# Patient Record
Sex: Female | Born: 1970
Health system: Southern US, Community
[De-identification: ages and names within clinical notes are randomized; demographics above are authoritative.]

## PROBLEM LIST (undated history)

## (undated) DIAGNOSIS — F411 Generalized anxiety disorder: Secondary | ICD-10-CM

## (undated) DIAGNOSIS — T7840XA Allergy, unspecified, initial encounter: Secondary | ICD-10-CM

## (undated) DIAGNOSIS — Z87442 Personal history of urinary calculi: Secondary | ICD-10-CM

## (undated) DIAGNOSIS — E039 Hypothyroidism, unspecified: Secondary | ICD-10-CM

## (undated) DIAGNOSIS — N189 Chronic kidney disease, unspecified: Secondary | ICD-10-CM

## (undated) DIAGNOSIS — L639 Alopecia areata, unspecified: Secondary | ICD-10-CM

## (undated) DIAGNOSIS — R001 Bradycardia, unspecified: Secondary | ICD-10-CM

## (undated) DIAGNOSIS — J45909 Unspecified asthma, uncomplicated: Secondary | ICD-10-CM

## (undated) DIAGNOSIS — K589 Irritable bowel syndrome without diarrhea: Secondary | ICD-10-CM

## (undated) DIAGNOSIS — D649 Anemia, unspecified: Secondary | ICD-10-CM

## (undated) HISTORY — DX: Hypothyroidism, unspecified: E03.9

## (undated) HISTORY — DX: Anemia, unspecified: D64.9

## (undated) HISTORY — DX: Allergy, unspecified, initial encounter: T78.40XA

## (undated) HISTORY — PX: TUBAL LIGATION: SHX77

## (undated) HISTORY — DX: Chronic kidney disease, unspecified: N18.9

## (undated) HISTORY — DX: Bradycardia, unspecified: R00.1

## (undated) HISTORY — DX: Unspecified asthma, uncomplicated: J45.909

## (undated) HISTORY — DX: Generalized anxiety disorder: F41.1

## (undated) HISTORY — DX: Alopecia areata, unspecified: L63.9

---
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
## 1997-11-09 ENCOUNTER — Inpatient Hospital Stay (HOSPITAL_COMMUNITY): Admission: AD | Admit: 1997-11-09 | Discharge: 1997-11-09 | Payer: Self-pay | Admitting: Obstetrics & Gynecology

## 1997-11-14 ENCOUNTER — Inpatient Hospital Stay (HOSPITAL_COMMUNITY): Admission: AD | Admit: 1997-11-14 | Discharge: 1997-11-16 | Payer: Self-pay | Admitting: Obstetrics and Gynecology

## 1998-07-18 ENCOUNTER — Inpatient Hospital Stay (HOSPITAL_COMMUNITY): Admission: AD | Admit: 1998-07-18 | Discharge: 1998-07-18 | Payer: Self-pay | Admitting: Obstetrics and Gynecology

## 1998-10-04 HISTORY — PX: TUBAL LIGATION: SHX77

## 1999-01-09 ENCOUNTER — Ambulatory Visit (HOSPITAL_COMMUNITY): Admission: RE | Admit: 1999-01-09 | Discharge: 1999-01-09 | Payer: Self-pay | Admitting: Obstetrics & Gynecology

## 2000-07-26 ENCOUNTER — Other Ambulatory Visit: Admission: RE | Admit: 2000-07-26 | Discharge: 2000-07-26 | Payer: Self-pay | Admitting: Obstetrics & Gynecology

## 2001-07-17 ENCOUNTER — Ambulatory Visit (HOSPITAL_COMMUNITY): Admission: RE | Admit: 2001-07-17 | Discharge: 2001-07-17 | Payer: Self-pay | Admitting: Urology

## 2001-07-17 ENCOUNTER — Encounter: Payer: Self-pay | Admitting: Urology

## 2001-12-11 ENCOUNTER — Other Ambulatory Visit: Admission: RE | Admit: 2001-12-11 | Discharge: 2001-12-11 | Payer: Self-pay | Admitting: Obstetrics & Gynecology

## 2002-02-27 ENCOUNTER — Encounter: Payer: Self-pay | Admitting: Otolaryngology

## 2002-02-27 ENCOUNTER — Encounter: Admission: RE | Admit: 2002-02-27 | Discharge: 2002-02-27 | Payer: Self-pay | Admitting: Otolaryngology

## 2002-05-04 DIAGNOSIS — L639 Alopecia areata, unspecified: Secondary | ICD-10-CM

## 2002-05-04 HISTORY — PX: CHOLECYSTECTOMY: SHX55

## 2002-05-04 HISTORY — DX: Alopecia areata, unspecified: L63.9

## 2002-05-19 ENCOUNTER — Ambulatory Visit (HOSPITAL_COMMUNITY): Admission: RE | Admit: 2002-05-19 | Discharge: 2002-05-19 | Payer: Self-pay | Admitting: Family Medicine

## 2002-05-19 ENCOUNTER — Encounter: Payer: Self-pay | Admitting: Family Medicine

## 2002-05-19 ENCOUNTER — Inpatient Hospital Stay (HOSPITAL_COMMUNITY): Admission: EM | Admit: 2002-05-19 | Discharge: 2002-05-21 | Payer: Self-pay | Admitting: Emergency Medicine

## 2002-05-19 ENCOUNTER — Encounter (INDEPENDENT_AMBULATORY_CARE_PROVIDER_SITE_OTHER): Payer: Self-pay | Admitting: Specialist

## 2002-05-20 ENCOUNTER — Encounter: Payer: Self-pay | Admitting: Surgery

## 2002-11-06 ENCOUNTER — Encounter: Payer: Self-pay | Admitting: Endocrinology

## 2002-11-06 ENCOUNTER — Encounter: Admission: RE | Admit: 2002-11-06 | Discharge: 2002-11-06 | Payer: Self-pay | Admitting: Endocrinology

## 2003-10-08 ENCOUNTER — Other Ambulatory Visit: Admission: RE | Admit: 2003-10-08 | Discharge: 2003-10-08 | Payer: Self-pay | Admitting: Obstetrics & Gynecology

## 2004-07-09 ENCOUNTER — Inpatient Hospital Stay (HOSPITAL_COMMUNITY): Admission: AD | Admit: 2004-07-09 | Discharge: 2004-07-13 | Payer: Self-pay | Admitting: Family Medicine

## 2004-07-09 ENCOUNTER — Ambulatory Visit: Payer: Self-pay | Admitting: Family Medicine

## 2004-10-21 ENCOUNTER — Ambulatory Visit: Payer: Self-pay | Admitting: Endocrinology

## 2004-10-26 ENCOUNTER — Other Ambulatory Visit: Admission: RE | Admit: 2004-10-26 | Discharge: 2004-10-26 | Payer: Self-pay | Admitting: Obstetrics & Gynecology

## 2004-10-27 ENCOUNTER — Other Ambulatory Visit: Admission: RE | Admit: 2004-10-27 | Discharge: 2004-10-27 | Payer: Self-pay | Admitting: Obstetrics & Gynecology

## 2004-10-28 ENCOUNTER — Ambulatory Visit: Payer: Self-pay | Admitting: Endocrinology

## 2005-04-12 ENCOUNTER — Other Ambulatory Visit: Admission: RE | Admit: 2005-04-12 | Discharge: 2005-04-12 | Payer: Self-pay | Admitting: Obstetrics & Gynecology

## 2005-10-25 ENCOUNTER — Ambulatory Visit: Payer: Self-pay | Admitting: Endocrinology

## 2005-11-01 ENCOUNTER — Ambulatory Visit: Payer: Self-pay | Admitting: Endocrinology

## 2006-11-08 ENCOUNTER — Ambulatory Visit: Payer: Self-pay | Admitting: Endocrinology

## 2006-11-08 LAB — CONVERTED CEMR LAB
ALT: 19 units/L (ref 0–40)
AST: 22 units/L (ref 0–37)
Albumin: 3.7 g/dL (ref 3.5–5.2)
Alkaline Phosphatase: 35 units/L — ABNORMAL LOW (ref 39–117)
BUN: 7 mg/dL (ref 6–23)
Basophils Absolute: 0.1 10*3/uL (ref 0.0–0.1)
Basophils Relative: 1.1 % — ABNORMAL HIGH (ref 0.0–1.0)
Bilirubin Urine: NEGATIVE
Bilirubin, Direct: 0.2 mg/dL (ref 0.0–0.3)
CO2: 28 meq/L (ref 19–32)
Calcium: 9 mg/dL (ref 8.4–10.5)
Chloride: 110 meq/L (ref 96–112)
Cholesterol: 156 mg/dL (ref 0–200)
Creatinine, Ser: 0.7 mg/dL (ref 0.4–1.2)
Crystals: NEGATIVE
Eosinophils Absolute: 0.3 10*3/uL (ref 0.0–0.6)
Eosinophils Relative: 6 % — ABNORMAL HIGH (ref 0.0–5.0)
GFR calc Af Amer: 122 mL/min
GFR calc non Af Amer: 101 mL/min
Glucose, Bld: 90 mg/dL (ref 70–99)
HCT: 38.3 % (ref 36.0–46.0)
HDL: 52.7 mg/dL (ref >39.0)
Hemoglobin, Urine: NEGATIVE
Hemoglobin: 13.3 g/dL (ref 12.0–15.0)
Ketones, ur: NEGATIVE mg/dL
LDL Cholesterol: 90 mg/dL (ref 0–99)
Lymphocytes Relative: 24.3 % (ref 12.0–46.0)
MCHC: 34.7 g/dL (ref 30.0–36.0)
MCV: 89.2 fL (ref 78.0–100.0)
Monocytes Absolute: 0.7 10*3/uL (ref 0.2–0.7)
Monocytes Relative: 13.6 % — ABNORMAL HIGH (ref 3.0–11.0)
Mucus, UA: NEGATIVE
Neutro Abs: 2.7 10*3/uL (ref 1.4–7.7)
Neutrophils Relative %: 55 % (ref 43.0–77.0)
Nitrite: NEGATIVE
Platelets: 296 10*3/uL (ref 150–400)
Potassium: 4.4 meq/L (ref 3.5–5.1)
RBC / HPF: NONE SEEN
RBC: 4.3 M/uL (ref 3.87–5.11)
RDW: 11.7 % (ref 11.5–14.6)
Sodium: 141 meq/L (ref 135–145)
Specific Gravity, Urine: 1.02 (ref 1.000–1.03)
TSH: 1.49 microintl units/mL (ref 0.35–5.50)
Total Bilirubin: 0.6 mg/dL (ref 0.3–1.2)
Total CHOL/HDL Ratio: 3
Total Protein, Urine: NEGATIVE mg/dL
Total Protein: 6.6 g/dL (ref 6.0–8.3)
Triglycerides: 69 mg/dL (ref 0–149)
Urine Glucose: NEGATIVE mg/dL
Urobilinogen, UA: 0.2 (ref 0.0–1.0)
VLDL: 14 mg/dL (ref 0–40)
WBC: 5 10*3/uL (ref 4.5–10.5)
pH: 7 (ref 5.0–8.0)

## 2006-11-15 ENCOUNTER — Ambulatory Visit: Payer: Self-pay | Admitting: Endocrinology

## 2007-06-03 ENCOUNTER — Encounter: Payer: Self-pay | Admitting: Endocrinology

## 2007-06-03 DIAGNOSIS — J45909 Unspecified asthma, uncomplicated: Secondary | ICD-10-CM

## 2007-06-03 DIAGNOSIS — E039 Hypothyroidism, unspecified: Secondary | ICD-10-CM

## 2007-06-03 DIAGNOSIS — J309 Allergic rhinitis, unspecified: Secondary | ICD-10-CM | POA: Insufficient documentation

## 2007-06-03 DIAGNOSIS — F411 Generalized anxiety disorder: Secondary | ICD-10-CM | POA: Insufficient documentation

## 2007-06-03 HISTORY — DX: Generalized anxiety disorder: F41.1

## 2007-06-03 HISTORY — DX: Hypothyroidism, unspecified: E03.9

## 2007-06-03 HISTORY — DX: Unspecified asthma, uncomplicated: J45.909

## 2007-11-13 ENCOUNTER — Ambulatory Visit: Payer: Self-pay | Admitting: Endocrinology

## 2007-11-14 LAB — CONVERTED CEMR LAB: TSH: 2.69 microintl units/mL (ref 0.35–5.50)

## 2007-12-12 ENCOUNTER — Ambulatory Visit: Payer: Self-pay | Admitting: Endocrinology

## 2007-12-13 ENCOUNTER — Telehealth (INDEPENDENT_AMBULATORY_CARE_PROVIDER_SITE_OTHER): Payer: Self-pay | Admitting: *Deleted

## 2007-12-19 ENCOUNTER — Encounter: Admission: RE | Admit: 2007-12-19 | Discharge: 2007-12-19 | Payer: Self-pay | Admitting: Endocrinology

## 2008-10-15 ENCOUNTER — Telehealth (INDEPENDENT_AMBULATORY_CARE_PROVIDER_SITE_OTHER): Payer: Self-pay | Admitting: *Deleted

## 2008-11-15 ENCOUNTER — Ambulatory Visit: Payer: Self-pay | Admitting: Endocrinology

## 2009-02-13 ENCOUNTER — Ambulatory Visit: Payer: Self-pay | Admitting: Endocrinology

## 2009-02-24 ENCOUNTER — Telehealth (INDEPENDENT_AMBULATORY_CARE_PROVIDER_SITE_OTHER): Payer: Self-pay | Admitting: *Deleted

## 2009-12-09 ENCOUNTER — Ambulatory Visit: Payer: Self-pay | Admitting: Endocrinology

## 2010-09-17 ENCOUNTER — Encounter
Admission: RE | Admit: 2010-09-17 | Discharge: 2010-09-17 | Payer: Self-pay | Source: Home / Self Care | Attending: Family Medicine | Admitting: Family Medicine

## 2010-11-03 NOTE — Assessment & Plan Note (Signed)
Summary: f/u appt/#/cd   Vital Signs:  Patient profile:   39 year old female Height:      64 inches Weight:      153.50 pounds BMI:     26.44 O2 Sat:      98 % on Room air Temp:     99.2 degrees F oral Pulse rate:   48 / minute Pulse rhythm:   regular BP sitting:   114 / 74  (left arm) Cuff size:   regular  Vitals Entered By: Brenton Grills (December 09, 2009 3:44 PM)  O2 Flow:  Room air CC: pt here for thyroid check/follow up/aj   CC:  pt here for thyroid check/follow up/aj.  History of Present Illness: pt states he feels well in general.  she take synthroid 75 micrograms/day, as rx'ed.    Current Medications (verified): 1)  Multivitamins   Tabs (Multiple Vitamin) .... Take 1 By Mouth Qd 2)  Levoxyl 75 Mcg  Tabs (Levothyroxine Sodium) .... Take 1 Poqd 3)  Caltrate 600+d 600-400 Mg-Unit  Tabs (Calcium Carbonate-Vitamin D) .... Take 1 By Mouth Two Times A Day Qd 4)  Miralax   Powd (Polyethylene Glycol 3350) .... Take 1 17 Grams Once Daily Prn 5)  Reflux Med .... Daily 6)  Omnaris 50 Mcg/act Susp (Ciclesonide) .... Prn 7)  Cefuroxime Axetil 250 Mg Tabs (Cefuroxime Axetil) .Marland Kitchen.. 1 Tab Bid  Allergies (verified): 1)  ! Codeine 2)  ! Erythromycin  Past History:  Past Medical History: Alopecia Arreata (05/2002) Asymptomatic Bradycardia URI (ICD-465.9) OTHER SYMPTOMS INVOLVING HEAD AND NECK (ICD-784.99) HYPOTHYROIDISM (ICD-244.9) ASTHMA (ICD-493.90) ANXIETY (ICD-300.00) ALLERGIC RHINITIS (ICD-477.9)  Review of Systems  The patient denies weight loss and weight gain.    Physical Exam  General:  normal appearance.   Neck:  thyroid has an irregular surface, but no nodule or enlargment. Additional Exam:   FastTSH                   2.20 uIU/mL                 0.35-5.50    Impression & Recommendations:  Problem # 1:  HYPOTHYROIDISM (ICD-244.9) well-replaced  Other Orders: TLB-TSH (Thyroid Stimulating Hormone) (84443-TSH) Est. Patient Level III (86578)  Patient  Instructions: 1)  pending the test results, please continue same levothyroxine (75/d) 2)  return 1 year 3)  (update: i left message on phone-tree:  rx as we discussed) Prescriptions: LEVOXYL 75 MCG  TABS (LEVOTHYROXINE SODIUM) take 1 poqd  #90 Each x 3   Entered and Authorized by:   Minus Breeding MD   Signed by:   Minus Breeding MD on 12/10/2009   Method used:   [facility]        [facility] [address] [id number][facility] (retail)       [address]       [place], [place]  [address]       Ph: [phone] or [phone]       Fax: [phone]   RxID:   [id number]

## 2011-01-21 ENCOUNTER — Other Ambulatory Visit (INDEPENDENT_AMBULATORY_CARE_PROVIDER_SITE_OTHER): Payer: BC Managed Care – PPO

## 2011-01-21 ENCOUNTER — Encounter: Payer: Self-pay | Admitting: Endocrinology

## 2011-01-21 ENCOUNTER — Ambulatory Visit (INDEPENDENT_AMBULATORY_CARE_PROVIDER_SITE_OTHER): Payer: BC Managed Care – PPO | Admitting: Endocrinology

## 2011-01-21 VITALS — BP 108/70 | HR 50 | Temp 98.6°F | Ht 64.0 in | Wt 156.2 lb

## 2011-01-21 DIAGNOSIS — E039 Hypothyroidism, unspecified: Secondary | ICD-10-CM

## 2011-01-21 NOTE — Patient Instructions (Addendum)
blood tests are being ordered for you today.  please call [phone] to hear your test results. pending the test results, please continue the same medications for now Please return in 1 year (update: i left message on phone-tree:  rx as we discussed)

## 2011-01-21 NOTE — Progress Notes (Signed)
  Subjective:    Patient ID: Audrey Christian, female    DOB: [birth year], 40 y.o.   MRN: [id number]  HPI Pt returns for f/u of chronic hypothyroidism.  pt states she feels well in general, on synthroid 75 mcg/d.  She was recently started on fe tabs.   Past Medical History  Diagnosis Date  . HYPOTHYROIDISM 06/03/2007  . ANXIETY 06/03/2007  . ASTHMA 06/03/2007  . Bradycardia     Asymptomatic  . Alopecia areata 05/2002   Past Surgical History  Procedure Date  . Cholecystectomy 05/2002    reports that she has never smoked. She does not have any smokeless tobacco history on file. Her alcohol and drug histories not on file. family history includes Hypothyroidism in her mother. Allergies  Allergen Reactions  . Codeine     REACTION: Pass Out  . Erythromycin     REACTION: Upset Stomach    Review of Systems Denies weight change    Objective:   Physical Exam GENERAL: no distress Neck:  Thyroid is not enlarged, but has an irregular surface.  No nodule     Lab Results  Component Value Date   WBC 5.0 11/08/2006   HGB 13.3 11/08/2006   HCT 38.3 11/08/2006   PLT 296 11/08/2006   CHOL 156 11/08/2006   TRIG 69 11/08/2006   HDL 52.7 11/08/2006   ALT 19 11/08/2006   AST 22 11/08/2006   NA 141 11/08/2006   K 4.4 11/08/2006   CL 110 11/08/2006   CREATININE 0.7 11/08/2006   BUN 7 11/08/2006   CO2 28 11/08/2006   TSH 1.80 01/21/2011      Assessment & Plan:  Hypothyroidism, well-replaced. Anemia, better

## 2011-01-28 ENCOUNTER — Other Ambulatory Visit: Payer: Self-pay | Admitting: Endocrinology

## 2011-02-19 NOTE — H&P (Signed)
NAME:  Audrey Christian, HECKERT NO.:  [id number]   MEDICAL RECORD NO.:  [id number]                   PATIENT TYPE:   LOCATION:                                       FACILITY:   PHYSICIAN:  Currie Paris, M.D.           DATE OF BIRTH:   DATE OF ADMISSION:  DATE OF DISCHARGE:                                HISTORY & PHYSICAL   CHIEF COMPLAINT:  Abdominal pain.   HISTORY OF PRESENT ILLNESS:  This is a 40 year old lady who felt acute  abdominal pain about 8:00 a.m. this morning. She had a similar episode a few  days earlier and because of this, she was seen by Dr. Gerri Spore. Dr.  Gerri Spore got some labs and noted that her white count was slightly  elevated at 12,000 and ordered a gallbladder ultrasound since her pain was  primarily epigastric and right upper quadrant. The ultrasound shows  gallstone lodged in the neck of the gallbladder. There is a question of some  thickened wall. The patient is otherwise in generally good health. She has  not really had any prior GI symptoms. She has had an endocrine workup by Dr.  Everardo All recently, apparently because of some hair loss and some other  symptoms but apparently the only finding was that of some thyroid nodules  which were thought to be benign. She is scheduled for dermatology workup for  some hair loss on her scalp.   MEDICATIONS:  Include Synthroid, Allegra, and Singulair.   ALLERGIES:  CODEINE (which made her pass out). She has a lot of nausea with  Erythromycin but can take it.   REVIEW OF SYSTEMS:  HEENT: Negative. Chest no cough or shortness of breath.  Heart negative. Abdomen negative except as for HPI. GU negative. Extremities  negative.   PHYSICAL EXAMINATION:  GENERAL: The patient is a healthy 39 year old who is  in no distress currently. Her pain has actually pretty well resolved.  HEENT: Head normocephalic. Eyes nonicteric. Pupils are equal, round, and  reactive to light and  accommodation.  NECK: Supple. No masses or thyromegaly. Does have somewhat of a nodular  thyroid.  LUNGS: Clear to auscultation and percussion.  HEART: Regular rate and rhythm.  No murmur, rub, or gallop.  ABDOMEN: Soft but she does have some tenderness to deep right upper quadrant  palpation. Bowel sounds are normal.  EXTREMITIES: No clubbing, cyanosis, or edema.   DIAGNOSTIC STUDIES:  Review of the ultrasound with the radiologist revealed  findings as noted above.   LABORATORY DATA:  Studies are pending.   IMPRESSION:  Acute cholecystitis.   PLAN:  I think she ought to go ahead with laparoscopic cholecystectomy  either tonight or in the morning depending on OR availability. I have gone  over the indications, risks, and complications. We talked about bleeding and  infection as well as bile duct injuries, injuries to other organs, possible  conversion to open,  etc. I think all questions  have been answered. She understands that we could treat her non operatively  and see if this episode will improve but based on her ultrasound, I told her  that I thought that was going to be unlikely. She would like to proceed with  surgery and all questions have been answered.                                               Currie Paris, M.D.    CJS/MEDQ  D:  05/19/2002  T:  05/20/2002  Job:  [id number]   cc:   Otilio Connors. Gerri Spore, M.D.

## 2011-02-19 NOTE — H&P (Signed)
Audrey Christian, Audrey Christian NO.:  [id number]   MEDICAL RECORD NO.:  [id number]          PATIENT TYPE:  INP   LOCATION:  5008                         FACILITY:  [facility]   PHYSICIAN:  Seymour Bars, D.O.      DATE OF BIRTH:  30-Mar-1971   DATE OF ADMISSION:  07/09/2004  DATE OF DISCHARGE:                                HISTORY & PHYSICAL   CHIEF COMPLAINT:  Cyst on stomach.   HISTORY OF PRESENT ILLNESS:  A 40 year old white female with history of  hypothyroidism and MRSA abscess on her right leg, status post I&D in August  2005, presented to [facility] on October 5th for a  cyst on her lower abdomen first noticed the day prior to admission in the  morning.  It was the size of a pin tip and appeared like a hair follicle,  according to her.  She denies any erythema immediately, but throughout the  afternoon it became tender and erythematous.  It also drained a clear  liquid.  She started feeling fatigued and feverish last weekend.  After  noticing the erythema, she did go to [facility], and the  ring of erythema increasing.  When she presented there on October 5th, she  was started on doxycycline and told to return on October 6th.  The ring of  erythema continued to grow, and she was sent to our hospital for increasing  redness despite outpatient antibiotics.   PAST MEDICAL HISTORY:  1.  Alopecia.  2.  Recurrent UTI with vesicoureteral reflux.  3.  Rosacea.  4.  Irritable bowel.  5.  Hypothyroidism.  6.  Anxiety.   PAST SURGICAL HISTORY:  1.  I&D abscess by Dr. Terri Piedra on her right leg, status post 6 weeks of      doxycycline.  2.  Tonsillectomy.  3.  Cholecystectomy.   MEDICATIONS:  1.  Zelnorm 6 mg p.o. b.i.d.  2.  Levoxyl 75 mcg daily.  3.  Alprazolam p.r.n.  4.  Metro Lotion 0.75% b.i.d. to her face.  5.  Rogaine.  6.  Clobex 0.05% lotion b.i.d. to her scalp.  7.  Lexapro 10 mg daily.  8.  Doxycycline 100 mg b.i.d.  started on October 5th.  9.  Caltrate-D t.i.d.  10. Tylenol p.r.n.  11. Multivitamin daily.  12. Bactroban ointment 2% nasal.   ALLERGIES:  1.  CODEINE makes her tired.  2.  ERYTHROMYCIN causes GI upset.   FAMILY HISTORY:  Mom is living with diabetes and atrial fibrillation, and  father died of colon cancer at 70.  Sister is healthy.   SOCIAL HISTORY:  Lives with husband and 2 children, ages 41 and 21, here in  [place].  She works at a daycare.  She is a nonsmoker.  Denies alcohol or  recent travel.   REVIEW OF SYSTEMS:  CONSTITUTIONAL:  Fevers up to 101 since Saturday.  No  change in weight.  No chills.  CARDIOVASCULAR:  No chest pain, no  palpitations.  MUSCULOSKELETAL:  Back pain.  RESPIRATORY:  No shortness of  breath.  GI:  Nausea, diarrhea, but no vomiting.  GENITOURINARY:  No dysuria  or hematuria recently.   OBJECTIVE:  VITAL SIGNS:  Temperature 98.5, pulse 75, respirations 20, blood  pressure 136/70.  O2 is 95% on room air.  GENERAL:  A pleasant white female, obese, in no acute distress.  HEENT:  Normocephalic, atraumatic.  Pupils equal, round, and reactive.  No  scleral icterus.  Moist mucous membranes.  CARDIOVASCULAR:  Regular rate and rhythm without murmurs, 2+ dorsalis pedis  pulses.  LUNGS: Clear to auscultation bilaterally.  Nonlabored.  ABDOMEN:  Positive bowel sounds.  No hepatosplenomegaly.  Nontender,  nondistended, however, tender over the erythema of her lower abdomen.  GENITOURINARY:  Deferred.  EXTREMITIES:  Without edema.  SKIN:  Over the lower abdomen just inferior to the umbilicus, there is a 0.3  cm necrotic-appearing lesion surrounded by 2 rings of erythema, the first  measures 2.2 cm in diameter, the larger measures 7.5 cm in diameter.  There  is a very small amount of induration underneath the 2.2 cm ring, which is  very tender, warm, and erythematous.  She also has multiple healing insect  bites including the original site which was I&D on her  right lower leg.  NEURO:  Normal sensation.   LABORATORY DATA:  Blood cultures x2 pending.  CBC and BMET pending.   ASSESSMENT AND PLAN:  A 40 year old female with history of MRSA in the right  leg admitted for spreading erythema despite doxycycline for abdominal  cellulitis.  1.  Cellulitis:  Treat with 6 weeks of doxycycline per MRSA abscess in      August to September 2005.  She was started on doxycycline on October      5th, however, due to hospitalization will change her to IV Vancomycin,      dose by Pharmacy.  Will check blood cultures and CBC for white count.      Will watch for spreading erythema      despite antibiotics.  On contact precautions for MRSA history.  Treat      suspected MRSA with intranasal Bactroban ointment.  2.  Fluid, electrolytes, and nutrition:  Regular diet.  Check electrolytes      and saline marker IV.       KB/MEDQ  D:  07/11/2004  T:  07/11/2004  Job:  [id number]   cc:   Dr. Merla Riches  [facility]

## 2011-02-19 NOTE — Discharge Summary (Signed)
NAMEDRAYA, FELKER               ACCOUNT NO.:  [id number]   MEDICAL RECORD NO.:  [id number]          PATIENT TYPE:  INP   LOCATION:  5008                         FACILITY:  [facility]   PHYSICIAN:  Santiago Bumpers. Hensel, M.D.DATE OF BIRTH:  12/20/1970   DATE OF ADMISSION:  07/09/2004  DATE OF DISCHARGE:  07/13/2004                                 DISCHARGE SUMMARY   DISCHARGE DIAGNOSES:  1.  Abdominal abscess status post incision and drainage.  2.  Hypothyroidism.  3.  Irritable bowel syndrome.  4.  Anxiety.   PROCEDURES:  On October 9 I&D was performed on lower abdomen.   DISCHARGE MEDICATIONS:  1.  Bactrim one tablet p.o. b.i.d. x5 days.  2.  Rifampin 300 mg one tablet daily x5 days.  3.  Percocet one to two tablets q.4-6h. p.r.n. pain, #20, no refills.  4.  Multivitamin daily.  5.  Os-Cal t.i.d.  6.  Zelnorm 60 mg b.i.d. with meals.  7.  Lexapro 10 mg p.o. daily.  8.  Synthroid 75 mcg p.o. daily.   HISTORY:  This is a 40 year old white female with a history of  hypothyroidism, MRSA abscess on her right lower leg that was I&D in August  of 2005.  Presented to [facility] on October 5 for a cyst on her lower abdomen  that she first noticed on the day prior to admission at which time it was  the size of a pin tip.  At that time it was non-erythematous, but was tender  and drained clear fluid.  It had grown in size that morning with a ring of  erythema that had been increasing on her abdomen.  __________  was started  on antibiotics on October 5 and returned on day of admission because the  redness had spread.   ADMISSION LABORATORIES:  Sodium 138, potassium 3.5, chloride 107,  bicarbonate 24, glucose 90, BUN 8, creatinine 0.9, calcium 9.2.  White count  7.8, hemoglobin 12.9, hematocrit 36.7, platelet count 222.   HOSPITAL COURSE:  #1 - ABDOMINAL ABSCESS/CELLULITIS:  Patient was admitted  and started on IV antibiotics.  She was started on IV vancomycin.  She had a  small scab on the  lower abdomen with an 8 cm x 15 cm area of induration and  erythema with an inner ring that was more erythematous that measured  approximately 3 cm x 6 cm.  Blood culture was sent which had no growth by  day of admission.  Patient was also with a history of MRSA and was given  intranasal Bactroban ointment b.i.d.  Patient still continuing to have this  area of erythema with a central area of necrosis on her abdomen which had  gotten smaller.  On hospital day #2 the area was I&D yielding bloody  purulent content that was sent for culture that grew Staph aureus.  On day  of discharge patient was given prescriptions for Bactrim and Rifampin to  complete a five day course for a total 10-day course antibiotics as well as  Percocet for pain control with followup at her primary care physician on  Wednesday.   #2 - HYPOTHYROIDISM:  Stable during this hospital stay.  Patient has been on  Synthroid.  IBS has been stable.  Patient on Zelnorm for this hospital stay.   DISCHARGE LABORATORIES:  CBC showed white count of 6.5, hemoglobin 13.1,  hematocrit 37.4, platelet count 241.   FOLLOW-UP ITEMS:  1.  Follow up primary care physician on Wednesday, Dr. Merla Riches.  2.  Change dressing one to two times daily as needed.  3.  Packing changes per primary care physician.       TH/MEDQ  D:  07/13/2004  T:  07/14/2004  Job:  [id number]   cc:   Dr. [facility]

## 2011-02-19 NOTE — Op Note (Signed)
TNAMESLOKA, VOLANTE                        ACCOUNT NO.:  [id number]   MEDICAL RECORD NO.:  [id number]                   PATIENT TYPE:  INP   LOCATION:  0453                                 FACILITY:  [facility]   PHYSICIAN:  Currie Paris, M.D.           DATE OF BIRTH:  [birth year]   DATE OF PROCEDURE:  05/20/2002  DATE OF DISCHARGE:                                 OPERATIVE REPORT   PREOPERATIVE DIAGNOSIS:  Calculus cholecystitis with recent biliary colic.   POSTOPERATIVE DIAGNOSIS:  Calculus cholecystitis with recent biliary colic.   OPERATION:  Laparoscopic cholecystectomy with operative cholangiogram.   SURGEON:  Currie Paris, M.D.   ASSISTANT:  Sharlet Salina T. Hoxworth, M.D.   ANESTHESIA:  General endotracheal.   CLINICAL HISTORY:  This patient is a 40 year old, who presented to the  emergency room yesterday with acute epigastric right upper quadrant pain.  She had been having problems off and on for the whole week, and ultrasound  had shown a stone that appeared to be impacted in the neck of the  gallbladder, did not move.  There was no pericholecystic fluid present.  Laboratory studies were basically unremarkable, although an initial verbal  report on a white count had been slightly elevated at 12,000, yet was  apparently normal or there was another one that I could not locate the  official report on.  Nevertheless, the patient wanted to proceed with  surgery, so she was scheduled for this morning.   DESCRIPTION OF PROCEDURE:  The patient seen in the holding area and had no  further questions.  She was taken to the operating room and after  satisfactory general endotracheal anesthesia had been obtained, the abdomen  was prepped and draped.  Marcaine 0.25% plain was used for each incision.  The umbilical incision was made first, the fascia opened, and the peritoneal  cavity entered under direct vision.  A pursestring was placed and the Hasson  introduced.  The  camera was placed, and no gross abnormalities were noted,  no distention of the stomach; liver looked normal; gallbladder was not  distended, and you could not really see the pelvic organs.   The patient placed in reverse Trendelenburg and tilted to the left.  Trocars  were placed in the epigastrium, two laterally and the gallbladder grasped  and retracted over the liver.  The stone was noted to be in the neck of the  gallbladder, and there was a little edema around the triangle of Calot.  This was really a minimal finding.  The peritoneum was opened on either side  so I could get a nice window and see the cystic duct throughout most of its  length down to where it joined the common duct as well as seeing the cystic  artery.  Once I had the anatomy clarified, I put in a nice window in the  triangle of Calot.  I went ahead and clipped the  cystic artery once and the  cystic duct once right where it joined the gallbladder.  The cystic duct was  opened and a Cook catheter used to do operative cholangiography which was  basically normal with good flow in duodenum, no filling defects, and  visualization of the hepatic radicles.   Cook catheter was removed and three clips placed on the stay side of the  cystic duct, and it was divided.  The cystic artery was clipped additionally  and divided, leaving two on the stay side and another small branch clipped.  The gallbladder was removed from below to above.  Just prior to  disconnecting it, we irrigated and made sure everything was dry and then  disconnected it and brought it out the umbilical port.   The umbilical port was occluded for a moment while we went ahead and did a  final irrigation, a final check for hemostasis.  Again, everything appeared  dry.   Lateral ports were removed.  The pursestring was used to close the umbilical  port.  The abdomen was deflated through the epigastric port.  The skin was  closed with 4-0 Monocryl subcuticular  plus Steri-Strips.  The patient  tolerated the procedure well.  There were no operative complications.  All  counts were correct.                                               Currie Paris, M.D.    CJS/MEDQ  D:  05/20/2002  T:  05/21/2002  Job:  [id number]   cc:   Otilio Connors. Gerri Spore, M.D.   Sean A. Everardo All, M.D. [facility]

## 2011-11-15 ENCOUNTER — Encounter: Payer: Self-pay | Admitting: Endocrinology

## 2011-11-15 ENCOUNTER — Ambulatory Visit (INDEPENDENT_AMBULATORY_CARE_PROVIDER_SITE_OTHER): Payer: BC Managed Care – PPO | Admitting: Endocrinology

## 2011-11-15 ENCOUNTER — Other Ambulatory Visit (INDEPENDENT_AMBULATORY_CARE_PROVIDER_SITE_OTHER): Payer: BC Managed Care – PPO

## 2011-11-15 VITALS — BP 102/62 | HR 61 | Temp 97.3°F | Ht 64.0 in | Wt 161.0 lb

## 2011-11-15 DIAGNOSIS — E039 Hypothyroidism, unspecified: Secondary | ICD-10-CM

## 2011-11-15 LAB — TSH: TSH: 2.21 u[IU]/mL (ref 0.35–5.50)

## 2011-11-15 MED ORDER — CEFUROXIME AXETIL 250 MG PO TABS: 250.0000 mg | ORAL_TABLET | Freq: Two times a day (BID) | ORAL | Status: DC

## 2011-11-15 NOTE — Progress Notes (Signed)
  Subjective:    Patient ID: Audrey Christian, female    DOB: 03-01-1971, 41 y.o.   MRN: [id number]  HPI Pt states 1 week of slight nodule at the right side of the throat, and assoc sore throat.   Past Medical History  Diagnosis Date  . HYPOTHYROIDISM 06/03/2007  . ANXIETY 06/03/2007  . ASTHMA 06/03/2007  . Bradycardia     Asymptomatic  . Alopecia areata 05/2002    Past Surgical History  Procedure Date  . Cholecystectomy 05/2002    History   Social History  . Marital Status: Married    Spouse Name: N/A    Number of Children: N/A  . Years of Education: N/A   Occupational History  .      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 Smoking status: Never Smoker   . Smokeless tobacco: Not on file  . Alcohol Use: Not on file  . Drug Use: Not on file  . Sexually Active: Not on file   Other Topics Concern  . Not on file   Social History Narrative  . No narrative on file    Current Outpatient Prescriptions on File Prior to Visit  Medication Sig Dispense Refill  . Calcium Carbonate-Vitamin D (CALTRATE 600+D) 600-400 MG-UNIT per tablet Take 2 tablets by mouth daily.       . Clobetasol Propionate 0.05 % lotion Apply topically 2 (two) times daily.        Marland Kitchen levothyroxine (SYNTHROID, LEVOTHROID) 75 MCG tablet TAKE ONE TABLET BY MOUTH EVERY DAY  90 tablet  3  . Multiple Vitamin (MULTIVITAMIN) tablet Take 1 tablet by mouth daily.        . polyethylene glycol powder (MIRALAX) powder Take 17 g by mouth daily as needed.          Allergies  Allergen Reactions  . Codeine     REACTION: Pass Out  . Erythromycin     REACTION: Upset Stomach    Family History  Problem Relation Age of Onset  . Hypothyroidism Mother     BP 102/62  Pulse 61  Temp(Src) 97.3 F (36.3 C) (Oral)  Ht 5\' 4"  (1.626 m)  Wt 161 lb (73.029 kg)  BMI 27.64 kg/m2  SpO2 97%  LMP 11/06/2011    Review of Systems Denies fever and earache.      Objective:   Physical Exam VITAL SIGNS:  See vs  page GENERAL: no distress head: no deformity eyes: no periorbital swelling, no proptosis external nose and ears are normal mouth: no lesion seen, except for slight irritation at the right posterior pharynx.   NECK: There is no palpable thyroid enlargement.  No thyroid nodule is palpable.  No palpable lymphadenopathy at the anterior neck.  Lab Results  Component Value Date   TSH 2.21 11/15/2011  '    Assessment & Plan:  Glenford Peers, new Hypothyroidism, well-repalced

## 2011-11-15 NOTE — Patient Instructions (Addendum)
i have sent a prescription to your pharmacy, for an antibiotic. blood tests are being requested for you today.  please call [phone] to hear your test results.  You will be prompted to enter the 9-digit "MRN" number that appears at the top left of this page, followed by #.  Then you will hear the message. I hope you feel better soon.  If you don't feel better by next week, please call back.

## 2011-11-16 ENCOUNTER — Other Ambulatory Visit: Payer: Self-pay

## 2011-11-16 MED ORDER — CEFUROXIME AXETIL 250 MG PO TABS: 250.0000 mg | ORAL_TABLET | Freq: Two times a day (BID) | ORAL | Status: AC

## 2011-11-16 NOTE — Telephone Encounter (Signed)
[facility] of [facility]

## 2011-12-07 ENCOUNTER — Ambulatory Visit: Payer: BC Managed Care – PPO | Admitting: Endocrinology

## 2012-01-11 ENCOUNTER — Encounter: Payer: Self-pay | Admitting: Physician Assistant

## 2012-01-11 ENCOUNTER — Ambulatory Visit (INDEPENDENT_AMBULATORY_CARE_PROVIDER_SITE_OTHER): Payer: BC Managed Care – PPO | Admitting: Physician Assistant

## 2012-01-11 VITALS — BP 109/71 | HR 61 | Temp 98.6°F | Resp 16 | Ht 63.0 in | Wt 159.4 lb

## 2012-01-11 DIAGNOSIS — J309 Allergic rhinitis, unspecified: Secondary | ICD-10-CM

## 2012-01-11 DIAGNOSIS — J019 Acute sinusitis, unspecified: Secondary | ICD-10-CM

## 2012-01-11 DIAGNOSIS — J069 Acute upper respiratory infection, unspecified: Secondary | ICD-10-CM

## 2012-01-11 MED ORDER — IPRATROPIUM BROMIDE 0.03 % NA SOLN: 2.0000 | Freq: Two times a day (BID) | NASAL | Status: DC

## 2012-01-11 MED ORDER — AMOXICILLIN 875 MG PO TABS: 1750.0000 mg | ORAL_TABLET | Freq: Two times a day (BID) | ORAL | Status: AC

## 2012-01-11 NOTE — Progress Notes (Signed)
  Subjective:    Patient ID: Audrey Christian, female    DOB: 01-09-1971, 41 y.o.   MRN: [id number]  HPI  Facial pressure and pain, lots of nasal congestion and drainage. Green in the morning, becomes clear by afternoon. Non-productive cough.  Chronic allergies, takes Allegra-D and uses Flonase daily. Non-smoker.  No fever/chills, no GU/GI symptoms.  No myalgias.  Review of Systems As above.     Objective:   Physical Exam  Vital signs noted. Well-developed, well nourished WF who is awake, alert and oriented, in NAD. HEENT: [place]/AT, PERRL, EOMI.  Sclera and conjunctiva are clear.  EAC are patent, TMs are normal in appearance. Nasal mucosa is congested, pink and moist. OP is clear. Frontal and maxillary sinuses are tender on palpation. Neck: supple, non-tender, no lymphadenopathey, thyromegaly. Heart: RRR, no murmur Lungs: CTA Skin: warm and dry without rash.       Assessment & Plan:   1. AR (allergic rhinitis)    2. URI (upper respiratory infection)  ipratropium (ATROVENT) 0.03 % nasal spray  3. Acute sinusitis, unspecified  amoxicillin (AMOXIL) 875 MG tablet   Supportive care.  Consider waiting on the ABX x 48 hours, as she may improve with supportive care and not need it.

## 2012-01-11 NOTE — Patient Instructions (Signed)
Get LOTS of rest and drink at least 64 ounces of water daily.  Continue your Flonase and Allegra. It is OK to use Vicks Vapo Rub, and also saline nasal spray, throat lozenges, etc.

## 2012-01-20 ENCOUNTER — Other Ambulatory Visit: Payer: Self-pay | Admitting: Endocrinology

## 2012-02-18 ENCOUNTER — Other Ambulatory Visit: Payer: Self-pay | Admitting: Obstetrics & Gynecology

## 2012-02-18 DIAGNOSIS — R928 Other abnormal and inconclusive findings on diagnostic imaging of breast: Secondary | ICD-10-CM

## 2012-02-24 ENCOUNTER — Ambulatory Visit
Admission: RE | Admit: 2012-02-24 | Discharge: 2012-02-24 | Disposition: A | Discharge status: Home / Self Care | Payer: BC Managed Care – PPO | Source: Ambulatory Visit | Attending: Obstetrics & Gynecology | Admitting: Obstetrics & Gynecology

## 2012-02-24 DIAGNOSIS — R928 Other abnormal and inconclusive findings on diagnostic imaging of breast: Secondary | ICD-10-CM

## 2012-04-18 ENCOUNTER — Ambulatory Visit (INDEPENDENT_AMBULATORY_CARE_PROVIDER_SITE_OTHER): Payer: BC Managed Care – PPO | Admitting: Family Medicine

## 2012-04-18 VITALS — BP 118/76 | HR 62 | Temp 98.3°F | Resp 16 | Ht 63.5 in | Wt 169.8 lb

## 2012-04-18 DIAGNOSIS — H6690 Otitis media, unspecified, unspecified ear: Secondary | ICD-10-CM

## 2012-04-18 DIAGNOSIS — H9209 Otalgia, unspecified ear: Secondary | ICD-10-CM

## 2012-04-18 MED ORDER — AZITHROMYCIN 250 MG PO TABS: ORAL_TABLET | ORAL | Status: AC

## 2012-04-18 MED ORDER — HYDROCORTISONE-ACETIC ACID 1-2 % OT SOLN: 3.0000 [drp] | Freq: Three times a day (TID) | OTIC | Status: AC

## 2012-04-18 NOTE — Progress Notes (Signed)
[facility]:  Office Visit  Chief Complaint:  Chief Complaint  Patient presents with  . Ear Fullness     L ear x 04/03/12 has been treated with antipyrine and neomycin ear drops 04/05/12 amox 875mg  bid 04/09/12  . Otalgia    L ear    HPI: Audrey Christian is a 41 y.o. female who complains of  Left ear infection , still has fullness and mild pain.  Decrease hearing since ear infection. Has been on oral abx, otic abx   Past Medical History  Diagnosis Date  . HYPOTHYROIDISM 06/03/2007  . ANXIETY 06/03/2007  . ASTHMA 06/03/2007  . Bradycardia     Asymptomatic  . Alopecia areata 05/2002   Past Surgical History  Procedure Date  . Cholecystectomy 05/2002   History   Social History  . Marital Status: Married    Spouse Name: N/A    Number of Children: N/A  . Years of Education: N/A   Occupational History  .      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 Smoking status: Never Smoker   . Smokeless tobacco: None  . Alcohol Use: None  . Drug Use: None  . Sexually Active: None   Other Topics Concern  . None   Social History Narrative  . None   Family History  Problem Relation Age of Onset  . Hypothyroidism Mother    Allergies  Allergen Reactions  . Codeine     REACTION: Pass Out  . Erythromycin     REACTION: Upset Stomach   Prior to Admission medications   Medication Sig Start Date End Date Taking? Authorizing Provider  amoxicillin (AMOXIL) 875 MG tablet Take 875 mg by mouth 2 (two) times daily.   Yes Historical Provider, MD  Calcium Carbonate-Vitamin D (CALTRATE 600+D) 600-400 MG-UNIT per tablet Take 2 tablets by mouth daily.    Yes Historical Provider, MD  Clobetasol Propionate 0.05 % lotion Apply topically 2 (two) times daily.     Yes Historical Provider, MD  levothyroxine (SYNTHROID, LEVOTHROID) 75 MCG tablet TAKE ONE TABLET BY MOUTH EVERY DAY 01/20/12  Yes Romero Belling, MD  minoxidil (ROGAINE) 2 % external solution Apply topically 2 (two)  times daily.   Yes Historical Provider, MD  Multiple Vitamin (MULTIVITAMIN) tablet Take 1 tablet by mouth daily.     Yes Historical Provider, MD  NON FORMULARY Nature's Pearl  1 by mouth once daily   Yes Historical Provider, MD  polyethylene glycol powder (MIRALAX) powder Take 17 g by mouth daily as needed.     Yes Historical Provider, MD  fexofenadine-pseudoephedrine (ALLEGRA-D 24) 180-240 MG per 24 hr tablet Take 1 tablet by mouth daily.    Historical Provider, MD  fluticasone (FLONASE) 50 MCG/ACT nasal spray Place 2 sprays into the nose daily.    Historical Provider, MD  ipratropium (ATROVENT) 0.03 % nasal spray Place 2 sprays into the nose 2 (two) times daily. 01/11/12 01/10/13  Chelle S Jeffery, PA-C  sodium chloride (OCEAN) 0.65 % nasal spray Place 1 spray into the nose as needed.    Historical Provider, MD     ROS: The patient denies fevers, chills, night sweats, unintentional weight loss, chest pain, palpitations, wheezing, dyspnea on exertion, nausea, vomiting, abdominal pain, dysuria, hematuria, melena, numbness, weakness, or tingling.   All other systems have been reviewed and were otherwise negative with the exception of those mentioned in the HPI and as above.    PHYSICAL EXAM:  Filed Vitals:   04/18/12 1523  BP: 118/76  Pulse: 62  Temp: 98.3 F (36.8 C)  Resp: 16   Filed Vitals:   04/18/12 1523  Height: 5' 3.5" (1.613 m)  Weight: 169 lb 12.8 oz (77.021 kg)   Body mass index is 29.61 kg/(m^2).  General: Alert, no acute distress HEENT:  Normocephalic, atraumatic, oropharynx patent.  LEft TM dull, nonmobile, meniscus present Cardiovascular:  Regular rate and rhythm, no rubs murmurs or gallops.  No Carotid bruits, radial pulse intact. No pedal edema.  Respiratory: Clear to auscultation bilaterally.  No wheezes, rales, or rhonchi.  No cyanosis, no use of accessory musculature GI: No organomegaly, abdomen is soft and non-tender, positive bowel sounds.  No masses. Skin: No  rashes. Neurologic: Facial musculature symmetric. Psychiatric: Patient is appropriate throughout our interaction. Lymphatic: No cervical lymphadenopathy Musculoskeletal: Gait intact.   LABS: Results for orders placed in visit on 11/15/11  TSH      Component Value Range   TSH 2.21  0.35 - 5.50 uIU/mL     EKG/XRAY:   Primary read interpreted by Dr. Conley Rolls at [facility].   ASSESSMENT/PLAN: Encounter Diagnosis  Name Primary?  . Otitis media not resolved Yes   Patient still has fullness and fluid behind ears. Has been on abx since early July. WIll try one more round of abx and also add vosol.  IF no improvement then refer to ENT    [name], DO 04/18/2012 3:43 PM

## 2012-04-19 ENCOUNTER — Telehealth: Payer: Self-pay

## 2012-04-19 NOTE — Telephone Encounter (Signed)
Spoke with patient to find the name of the specialist that she is going to see tomorrow.  Patient states that she is going to see Dr [name] at [facility] in [place], [place].

## 2012-04-19 NOTE — Telephone Encounter (Signed)
The patient called to request medical records from visit on 04/18/12 be faxed to the specialist that she will be seeing tomorrow 04/20/12 at [phone].  Please call the patient at [phone] with any questions.

## 2012-06-14 ENCOUNTER — Encounter: Payer: Self-pay | Admitting: Family Medicine

## 2012-08-05 ENCOUNTER — Ambulatory Visit (INDEPENDENT_AMBULATORY_CARE_PROVIDER_SITE_OTHER): Payer: BC Managed Care – PPO | Admitting: Family Medicine

## 2012-08-05 VITALS — BP 126/77 | HR 51 | Temp 98.2°F | Resp 16 | Ht 63.5 in | Wt 165.4 lb

## 2012-08-05 DIAGNOSIS — J40 Bronchitis, not specified as acute or chronic: Secondary | ICD-10-CM

## 2012-08-05 DIAGNOSIS — J329 Chronic sinusitis, unspecified: Secondary | ICD-10-CM

## 2012-08-05 MED ORDER — LEVOFLOXACIN 500 MG PO TABS: 500.0000 mg | ORAL_TABLET | Freq: Every day | ORAL | Status: DC

## 2012-08-05 MED ORDER — PREDNISONE 20 MG PO TABS: 40.0000 mg | ORAL_TABLET | Freq: Every day | ORAL | Status: DC

## 2012-08-05 NOTE — Progress Notes (Signed)
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  She feels tired.  No diarrhea  Objective:  NAD TM's normal Nose: swollen, mildly erythematous mm's Oroph:  Clear Neck: supple, no adenopathy Chest:  Few faint wheezes  Assessment:  Persistent bronchitis and sinusitis  1. Bronchitis  levofloxacin (LEVAQUIN) 500 MG tablet, predniSONE (DELTASONE) 20 MG tablet  2. Sinusitis  levofloxacin (LEVAQUIN) 500 MG tablet, predniSONE (DELTASONE) 20 MG tablet

## 2012-10-15 ENCOUNTER — Telehealth: Payer: Self-pay

## 2012-10-15 ENCOUNTER — Ambulatory Visit (INDEPENDENT_AMBULATORY_CARE_PROVIDER_SITE_OTHER): Payer: BC Managed Care – PPO | Admitting: Family Medicine

## 2012-10-15 VITALS — BP 114/69 | HR 60 | Temp 97.9°F | Resp 12 | Ht 63.5 in | Wt 168.0 lb

## 2012-10-15 DIAGNOSIS — N390 Urinary tract infection, site not specified: Secondary | ICD-10-CM

## 2012-10-15 LAB — POCT URINALYSIS DIPSTICK
Bilirubin, UA: NEGATIVE
Blood, UA: NEGATIVE
Glucose, UA: NEGATIVE
Ketones, UA: NEGATIVE
Leukocytes, UA: NEGATIVE
Nitrite, UA: NEGATIVE
Protein, UA: NEGATIVE
Spec Grav, UA: <=1.005
Urobilinogen, UA: 0.2
pH, UA: 7

## 2012-10-15 LAB — POCT UA - MICROSCOPIC ONLY
Bacteria, U Microscopic: NEGATIVE
Casts, Ur, LPF, POC: NEGATIVE
Crystals, Ur, HPF, POC: NEGATIVE
Mucus, UA: NEGATIVE
RBC, urine, microscopic: NEGATIVE
WBC, Ur, HPF, POC: NEGATIVE
Yeast, UA: NEGATIVE

## 2012-10-15 MED ORDER — CIPROFLOXACIN HCL 500 MG PO TABS: 500.0000 mg | ORAL_TABLET | Freq: Two times a day (BID) | ORAL | Status: DC

## 2012-10-15 NOTE — Telephone Encounter (Signed)
PT STATES THAT SHE WAS SEEN IN OUR CLINIC TODAY AND CIPRO WAS CALLED IN FOR HER BUT SHE WAS ALSO SUPPOSED TO HAVE PERIMIDEUM? CALLED IN TO HELP WITH THE PRESSURE CAUSED BY HER UTI. PLEASE ADVISE.  [facility]  BEST# [phone]

## 2012-10-15 NOTE — Progress Notes (Signed)
42 yo woman with recent UTI Dec 26-Jan 4 with Macrodantin Rx.  She has a h/o ureteral reflux.  Over the  Last week she has developed foul smelling urine with frequency, bladder spasms.  Associated:  Nausea, some back pain Sig Negs:  No fever, vomiting, hematuria  Objective: NAD Tender suprapubic area Nontender CVA Results for orders placed in visit on 10/15/12  POCT UA - MICROSCOPIC ONLY      Component Value Range   WBC, Ur, HPF, POC neg     RBC, urine, microscopic neg     Bacteria, U Microscopic neg     Mucus, UA neg     Epithelial cells, urine per micros 0-4     Crystals, Ur, HPF, POC neg     Casts, Ur, LPF, POC neg     Yeast, UA neg    POCT URINALYSIS DIPSTICK      Component Value Range   Color, UA yellow     Clarity, UA clear     Glucose, UA neg     Bilirubin, UA neg     Ketones, UA neg     Spec Grav, UA <=1.005     Blood, UA neg     pH, UA 7.0     Protein, UA neg     Urobilinogen, UA 0.2     Nitrite, UA neg     Leukocytes, UA Negative       Results for orders placed in visit on 11/15/11  TSH      Component Value Range   TSH 2.21  0.35 - 5.50 uIU/mL   Assessment:

## 2012-10-16 LAB — URINE CULTURE: Colony Count: 7000

## 2012-10-16 MED ORDER — PHENAZOPYRIDINE HCL 200 MG PO TABS: 200.0000 mg | ORAL_TABLET | Freq: Three times a day (TID) | ORAL | Status: DC | PRN

## 2012-10-16 NOTE — Telephone Encounter (Signed)
Called patient to advise. Heather approved this to be sent in for her.

## 2012-12-13 ENCOUNTER — Ambulatory Visit (INDEPENDENT_AMBULATORY_CARE_PROVIDER_SITE_OTHER): Payer: BC Managed Care – PPO | Admitting: Endocrinology

## 2012-12-13 ENCOUNTER — Encounter: Payer: Self-pay | Admitting: Endocrinology

## 2012-12-13 VITALS — BP 126/74 | HR 77 | Wt 167.0 lb

## 2012-12-13 DIAGNOSIS — D509 Iron deficiency anemia, unspecified: Secondary | ICD-10-CM

## 2012-12-13 DIAGNOSIS — Z0000 Encounter for general adult medical examination without abnormal findings: Secondary | ICD-10-CM

## 2012-12-13 DIAGNOSIS — E039 Hypothyroidism, unspecified: Secondary | ICD-10-CM

## 2012-12-13 LAB — CBC WITH DIFFERENTIAL/PLATELET
Basophils Absolute: 0.1 10*3/uL (ref 0.0–0.1)
Eosinophils Absolute: 0.2 10*3/uL (ref 0.0–0.7)
HCT: 38.4 % (ref 36.0–46.0)
Hemoglobin: 13.1 g/dL (ref 12.0–15.0)
Lymphs Abs: 1.8 10*3/uL (ref 0.7–4.0)
MCHC: 34.2 g/dL (ref 30.0–36.0)
Neutro Abs: 3.1 10*3/uL (ref 1.4–7.7)
RDW: 13.4 % (ref 11.5–14.6)

## 2012-12-13 LAB — LIPID PANEL
Cholesterol: 189 mg/dL (ref 0–200)
HDL: 63.3 mg/dL (ref >39.00)
LDL Cholesterol: 107 mg/dL — ABNORMAL HIGH (ref 0–99)
Triglycerides: 92 mg/dL (ref 0.0–149.0)
VLDL: 18.4 mg/dL (ref 0.0–40.0)

## 2012-12-13 LAB — BASIC METABOLIC PANEL
Chloride: 101 mEq/L (ref 96–112)
Potassium: 3.8 mEq/L (ref 3.5–5.1)

## 2012-12-13 LAB — TSH: TSH: 0.98 u[IU]/mL (ref 0.35–5.50)

## 2012-12-13 NOTE — Progress Notes (Signed)
Subjective:    Patient ID: Audrey Christian, female    DOB: 09/29/1971, 42 y.o.   MRN: [id number]  HPI Pt returns for f/u of chronic hypothyroidism (dx'ed approx 1997).  pt states she feels well in general, except for fatigue.   She says she has h/o fe-deficiency.  She was rx'ed with oral fe tabs by dr buccini.   Past Medical History  Diagnosis Date  . HYPOTHYROIDISM 06/03/2007  . ANXIETY 06/03/2007  . ASTHMA 06/03/2007  . Bradycardia     Asymptomatic  . Alopecia areata 05/2002  . Allergy   . Chronic kidney disease   . Anemia     Past Surgical History  Procedure Laterality Date  . Cholecystectomy  05/2002  . Tubal ligation      History   Social History  . Marital Status: Married    Spouse Name: N/A    Number of Children: N/A  . Years of Education: N/A   Occupational History  .      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 Smoking status: Never Smoker   . Smokeless tobacco: Never Used  . Alcohol Use: No  . Drug Use: No  . Sexually Active: Yes    Birth Control/ Protection: None   Other Topics Concern  . Not on file   Social History Narrative  . No narrative on file    Current Outpatient Prescriptions on File Prior to Visit  Medication Sig Dispense Refill  . albuterol (PROVENTIL HFA;VENTOLIN HFA) 108 (90 BASE) MCG/ACT inhaler Inhale 1 puff into the lungs as needed.      . benzonatate (TESSALON) 100 MG capsule Take 100 mg by mouth 3 (three) times daily as needed.      . Calcium Carbonate-Vitamin D (CALTRATE 600+D) 600-400 MG-UNIT per tablet Take 2 tablets by mouth daily.       . ciprofloxacin (CIPRO) 500 MG tablet Take 1 tablet (500 mg total) by mouth 2 (two) times daily.  20 tablet  2  . Clobetasol Propionate 0.05 % lotion Apply topically 2 (two) times daily.        . fexofenadine-pseudoephedrine (ALLEGRA-D 24) 180-240 MG per 24 hr tablet Take 1 tablet by mouth daily.      . fluticasone (FLONASE) 50 MCG/ACT nasal spray Place 2 sprays into the nose daily.       Marland Kitchen ipratropium (ATROVENT) 0.03 % nasal spray Place 2 sprays into the nose 2 (two) times daily.  30 mL  5  . levothyroxine (SYNTHROID, LEVOTHROID) 75 MCG tablet TAKE ONE TABLET BY MOUTH EVERY DAY  90 tablet  3  . minoxidil (ROGAINE) 2 % external solution Apply topically 2 (two) times daily.      . Multiple Vitamin (MULTIVITAMIN) tablet Take 1 tablet by mouth daily.        . NON FORMULARY Nature's Pearl  1 by mouth once daily      . phenazopyridine (PYRIDIUM) 200 MG tablet Take 1 tablet (200 mg total) by mouth 3 (three) times daily as needed for pain.  10 tablet  0  . polyethylene glycol powder (MIRALAX) powder Take 17 g by mouth daily as needed.        . predniSONE (DELTASONE) 20 MG tablet Take 2 tablets (40 mg total) by mouth daily.  10 tablet  0  . sodium chloride (OCEAN) 0.65 % nasal spray Place 1 spray into the nose as needed.       No current facility-administered medications on file  prior to visit.    Allergies  Allergen Reactions  . Erythromycin     REACTION: Upset Stomach    Family History  Problem Relation Age of Onset  . Hypothyroidism Mother   . Hypertension Mother   . Cancer Father   . Cancer Maternal Grandmother   . Cancer Maternal Grandfather   . Cancer Paternal Grandmother   . Cancer Paternal Grandfather     BP 126/74  Pulse 77  Wt 167 lb (75.751 kg)  BMI 29.12 kg/m2  SpO2 97%  Review of Systems Denies brbpr and hematuria.    Objective:   Physical Exam VITAL SIGNS:  See vs page. GENERAL: no distress. NECK: There is no palpable thyroid enlargement.  No thyroid nodule is palpable.  No palpable lymphadenopathy at the anterior neck.    Lab Results  Component Value Date   TSH 0.98 12/13/2012   Lab Results  Component Value Date   WBC 6.1 12/13/2012   HGB 13.1 12/13/2012   HCT 38.4 12/13/2012   MCV 89.6 12/13/2012   PLT 237.0 12/13/2012       Assessment & Plan:  Hypothyroidism, well-replaced Anemia, resolved

## 2012-12-13 NOTE — Patient Instructions (Addendum)
blood tests are being requested for you today.  We'll contact you with results.   Please return in 1 year.   

## 2013-01-15 ENCOUNTER — Other Ambulatory Visit: Payer: Self-pay | Admitting: Endocrinology

## 2013-01-17 ENCOUNTER — Other Ambulatory Visit: Payer: Self-pay | Admitting: *Deleted

## 2013-01-17 MED ORDER — LEVOTHYROXINE SODIUM 75 MCG PO TABS: 75.0000 ug | ORAL_TABLET | Freq: Every day | ORAL | Status: DC

## 2013-02-05 ENCOUNTER — Ambulatory Visit (INDEPENDENT_AMBULATORY_CARE_PROVIDER_SITE_OTHER): Payer: BC Managed Care – PPO | Admitting: Family Medicine

## 2013-02-05 VITALS — BP 118/80 | HR 64 | Temp 98.6°F | Resp 16 | Ht 63.0 in | Wt 166.0 lb

## 2013-02-05 DIAGNOSIS — H9209 Otalgia, unspecified ear: Secondary | ICD-10-CM

## 2013-02-05 DIAGNOSIS — H9202 Otalgia, left ear: Secondary | ICD-10-CM

## 2013-02-05 DIAGNOSIS — L84 Corns and callosities: Secondary | ICD-10-CM

## 2013-02-05 DIAGNOSIS — R002 Palpitations: Secondary | ICD-10-CM

## 2013-02-05 LAB — POCT CBC
Granulocyte percent: 57.9 %G (ref 37–80)
HCT, POC: 40 % (ref 37.7–47.9)
Hemoglobin: 13 g/dL (ref 12.2–16.2)
Lymph, poc: 1.8 (ref 0.6–3.4)
MCH, POC: 30 pg (ref 27–31.2)
MCHC: 32.5 g/dL (ref 31.8–35.4)
MCV: 92.1 fL (ref 80–97)
MID (cbc): 0.7 (ref 0–0.9)
MPV: 8.9 fL (ref 0–99.8)
POC Granulocyte: 3.5 (ref 2–6.9)
POC LYMPH PERCENT: 29.8 % (ref 10–50)
POC MID %: 12.3 %M — AB (ref 0–12)
Platelet Count, POC: 291 10*3/uL (ref 142–424)
RBC: 4.34 M/uL (ref 4.04–5.48)
RDW, POC: 13.5 %
WBC: 6 10*3/uL (ref 4.6–10.2)

## 2013-02-05 LAB — BASIC METABOLIC PANEL
Chloride: 102 mEq/L (ref 96–112)
Creat: 0.87 mg/dL (ref 0.50–1.10)
Potassium: 4.1 mEq/L (ref 3.5–5.3)

## 2013-02-05 LAB — BASIC METABOLIC PANEL WITH GFR
BUN: 13 mg/dL (ref 6–23)
CO2: 24 meq/L (ref 19–32)
Calcium: 9.8 mg/dL (ref 8.4–10.5)
Glucose, Bld: 82 mg/dL (ref 70–99)
Sodium: 136 meq/L (ref 135–145)

## 2013-02-05 LAB — TSH: TSH: 2.268 u[IU]/mL (ref 0.350–4.500)

## 2013-02-05 NOTE — Progress Notes (Signed)
[facility]:  Office Visit  Chief Complaint:  Chief Complaint  Patient presents with  . Tachycardia    x 1 week  . wart    left finger between the 4th and 5th finger x 1 week  . Otalgia    left ear x 1 week    HPI: Audrey Christian is a 42 y.o. female who complains of  1. Feels like she has left ear pain, fluid behind ears. No URI sxs. No fevers, chills. Had a lot of ear infections last summer. She has not been swimming. She had swimmer's ear all the time as a kid but she has not been in the water. 2. Wart left 4th and 5th webspace--had warts before tried OTC salicyclic acid without relief.  3. Has had palpitations x 1 week, started drinking a powder drink with  . She feels a flutter less than a few seconds, at rest or with exertion, then spontaneously , more in the last week. She was drinking with caffeine. Mango tea mix add to water tiwth caffeine. She has been walking 4 miles 4 days a week. No sxs with it. Nonsmoker, no etoh.  Last dose of mango tea was Thursday, last flutter was today but much lighter. Was gardening on Satrday but did not have any sxs. No diabetes, no HTN, cholesterol was slightly was slightly high  Past Medical History  Diagnosis Date  . HYPOTHYROIDISM 06/03/2007  . ANXIETY 06/03/2007  . ASTHMA 06/03/2007  . Bradycardia     Asymptomatic  . Alopecia areata 05/2002  . Allergy   . Chronic kidney disease   . Anemia    Past Surgical History  Procedure Laterality Date  . Cholecystectomy  05/2002  . Tubal ligation     History   Social History  . Marital Status: Married    Spouse Name: N/A    Number of Children: N/A  . Years of Education: N/A   Occupational History  .      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 Smoking status: Never Smoker   . Smokeless tobacco: Never Used  . Alcohol Use: No  . Drug Use: No  . Sexually Active: Yes    Birth Control/ Protection: None   Other Topics Concern  . None   Social History  Narrative  . None   Family History  Problem Relation Age of Onset  . Hypothyroidism Mother   . Hypertension Mother   . Cancer Father   . Cancer Maternal Grandmother   . Cancer Maternal Grandfather   . Cancer Paternal Grandmother   . Cancer Paternal Grandfather    Allergies  Allergen Reactions  . Erythromycin     REACTION: Upset Stomach   Prior to Admission medications   Medication Sig Start Date End Date Taking? Authorizing Provider  Calcium Carbonate-Vitamin D (CALTRATE 600+D) 600-400 MG-UNIT per tablet Take 2 tablets by mouth daily.    Yes Historical Provider, MD  Clobetasol Propionate 0.05 % lotion Apply topically 2 (two) times daily.     Yes Historical Provider, MD  levothyroxine (SYNTHROID, LEVOTHROID) 75 MCG tablet Take 1 tablet (75 mcg total) by mouth daily before breakfast. 01/17/13  Yes Romero Belling, MD  minoxidil (ROGAINE) 2 % external solution Apply topically 2 (two) times daily.   Yes Historical Provider, MD  Multiple Vitamin (MULTIVITAMIN) tablet Take 1 tablet by mouth daily.     Yes Historical Provider, MD  polyethylene glycol powder (MIRALAX) powder Take  17 g by mouth daily as needed.     Yes Historical Provider, MD  albuterol (PROVENTIL HFA;VENTOLIN HFA) 108 (90 BASE) MCG/ACT inhaler Inhale 1 puff into the lungs as needed.    Historical Provider, MD  benzonatate (TESSALON) 100 MG capsule Take 100 mg by mouth 3 (three) times daily as needed.    Historical Provider, MD  ciprofloxacin (CIPRO) 500 MG tablet Take 1 tablet (500 mg total) by mouth 2 (two) times daily. 10/15/12   Elvina Sidle, MD  fexofenadine-pseudoephedrine (ALLEGRA-D 24) 180-240 MG per 24 hr tablet Take 1 tablet by mouth daily.    Historical Provider, MD  fluticasone (FLONASE) 50 MCG/ACT nasal spray Place 2 sprays into the nose daily.    Historical Provider, MD  ipratropium (ATROVENT) 0.03 % nasal spray Place 2 sprays into the nose 2 (two) times daily. 01/11/12 01/10/13  Chelle Tessa Lerner, PA-C  meloxicam  (MOBIC) 15 MG tablet  11/08/12   Historical Provider, MD  NON FORMULARY Nature's Pearl  1 by mouth once daily    Historical Provider, MD  phenazopyridine (PYRIDIUM) 200 MG tablet Take 1 tablet (200 mg total) by mouth 3 (three) times daily as needed for pain. 10/16/12   Heather Jaquita Rector, PA-C  predniSONE (DELTASONE) 20 MG tablet Take 2 tablets (40 mg total) by mouth daily. 08/05/12   Elvina Sidle, MD  sodium chloride (OCEAN) 0.65 % nasal spray Place 1 spray into the nose as needed.    Historical Provider, MD     ROS: The patient denies fevers, chills, night sweats, unintentional weight loss, chest pain, wheezing, dyspnea on exertion, nausea, vomiting, abdominal pain, dysuria, hematuria, melena, numbness, weakness, or tingling.   All other systems have been reviewed and were otherwise negative with the exception of those mentioned in the HPI and as above.    PHYSICAL EXAM: Filed Vitals:   02/05/13 1543  BP: 118/80  Pulse: 64  Temp: 98.6 F (37 C)  Resp: 16   Filed Vitals:   02/05/13 1543  Height: 5\' 3"  (1.6 m)  Weight: 166 lb (75.297 kg)   Body mass index is 29.41 kg/(m^2).  General: Alert, no acute distress HEENT:  Normocephalic, atraumatic, oropharynx patent. Left TM slighlty dull to light, doe not appear erythematous. Non tender  Cardiovascular:  Regular rate and rhythm, no rubs murmurs or gallops.  No Carotid bruits, radial pulse intact. No pedal edema.  Respiratory: Clear to auscultation bilaterally.  No wheezes, rales, or rhonchi.  No cyanosis, no use of accessory musculature GI: No organomegaly, abdomen is soft and non-tender, positive bowel sounds.  No masses. Skin: + callus between 4th and th toe Neurologic: Facial musculature symmetric. Psychiatric: Patient is appropriate throughout our interaction. Lymphatic: No cervical lymphadenopathy Musculoskeletal: Gait intact.   LABS: Results for orders placed in visit on 02/05/13  POCT CBC      Result Value Range   WBC 6.0   4.6 - 10.2 K/uL   Lymph, poc 1.8  0.6 - 3.4   POC LYMPH PERCENT 29.8  10 - 50 %L   MID (cbc) 0.7  0 - 0.9   POC MID % 12.3 (*) 0 - 12 %M   POC Granulocyte 3.5  2 - 6.9   Granulocyte percent 57.9  37 - 80 %G   RBC 4.34  4.04 - 5.48 M/uL   Hemoglobin 13.0  12.2 - 16.2 g/dL   HCT, POC 16.1  09.6 - 47.9 %   MCV 92.1  80 - 97 fL  MCH, POC 30.0  27 - 31.2 pg   MCHC 32.5  31.8 - 35.4 g/dL   RDW, POC 16.1     Platelet Count, POC 291  142 - 424 K/uL   MPV 8.9  0 - 99.8 fL     EKG/XRAY:   Primary read interpreted by Dr. Conley Rolls at [facility]. Marked Sinus bradycardia, nonspecific ST changes 49 bpm   ASSESSMENT/PLAN: Encounter Diagnoses  Name Primary?  . Palpitations Yes  . Otalgia of left ear   . Foot callus    EKG  was sinus brady otherwise normal, pt has no sxs F/u cardiology if sxs persist. She declines any further workup at this tim CMP, TSH pending Shaved foot callus Will c/w Vosol  For ear if worse then she will call for abx Go to ER prn    Andreana Klingerman PHUONG, DO 02/05/2013 5:10 PM

## 2013-05-13 ENCOUNTER — Ambulatory Visit (INDEPENDENT_AMBULATORY_CARE_PROVIDER_SITE_OTHER): Payer: BC Managed Care – PPO | Admitting: Family Medicine

## 2013-05-13 VITALS — BP 112/66 | HR 60 | Temp 98.7°F | Resp 16 | Ht 64.0 in | Wt 174.2 lb

## 2013-05-13 DIAGNOSIS — N764 Abscess of vulva: Secondary | ICD-10-CM

## 2013-05-13 DIAGNOSIS — M25559 Pain in unspecified hip: Secondary | ICD-10-CM

## 2013-05-13 MED ORDER — DOXYCYCLINE HYCLATE 100 MG PO CAPS: 100.0000 mg | ORAL_CAPSULE | Freq: Two times a day (BID) | ORAL | Status: DC

## 2013-05-13 NOTE — Patient Instructions (Signed)
Abscess An abscess is an infected area that contains a collection of pus and debris.It can occur in almost any part of the body. An abscess is also known as a furuncle or boil. CAUSES  An abscess occurs when tissue gets infected. This can occur from blockage of oil or sweat glands, infection of hair follicles, or a minor injury to the skin. As the body tries to fight the infection, pus collects in the area and creates pressure under the skin. This pressure causes pain. People with weakened immune systems have difficulty fighting infections and get certain abscesses more often.  SYMPTOMS Usually an abscess develops on the skin and becomes a painful mass that is red, warm, and tender. If the abscess forms under the skin, you may feel a moveable soft area under the skin. Some abscesses break open (rupture) on their own, but most will continue to get worse without care. The infection can spread deeper into the body and eventually into the bloodstream, causing you to feel ill.  DIAGNOSIS  Your caregiver will take your medical history and perform a physical exam. A sample of fluid may also be taken from the abscess to determine what is causing your infection. TREATMENT  Your caregiver may prescribe antibiotic medicines to fight the infection. However, taking antibiotics alone usually does not cure an abscess. Your caregiver may need to make a small cut (incision) in the abscess to drain the pus. In some cases, gauze is packed into the abscess to reduce pain and to continue draining the area. HOME CARE INSTRUCTIONS   Only take over-the-counter or prescription medicines for pain, discomfort, or fever as directed by your caregiver.  If you were prescribed antibiotics, take them as directed. Finish them even if you start to feel better.  If gauze is used, follow your caregiver's directions for changing the gauze.  To avoid spreading the infection:  Keep your draining abscess covered with a  bandage.  Wash your hands well.  Do not share personal care items, towels, or whirlpools with others.  Avoid skin contact with others.  Keep your skin and clothes clean around the abscess.  Keep all follow-up appointments as directed by your caregiver. SEEK MEDICAL CARE IF:   You have increased pain, swelling, redness, fluid drainage, or bleeding.  You have muscle aches, chills, or a general ill feeling.  You have a fever. MAKE SURE YOU:   Understand these instructions.  Will watch your condition.  Will get help right away if you are not doing well or get worse. Document Released: 06/30/2005 Document Revised: 03/21/2012 Document Reviewed: 12/03/2011 [employer] Patient Information 2014 [employer].  

## 2013-05-13 NOTE — Progress Notes (Signed)
Subjective:    Patient ID: Audrey Christian, female    DOB: 04-11-1971, 42 y.o.   MRN: [id number]   Chief Complaint  Patient presents with  . Groin Swelling    swelling on the opening of her vagina for two days   HPI  Had swollen cyst on her right labia minora a month ago - then pus came out 2-3d later, got better but now for the past few days has again become progressively swollen and worse.  No prior medical care or problems with this.  Uncomfortable to sit/walk, etc. Hurts to touch.  No f/c.  Past Medical History  Diagnosis Date  . HYPOTHYROIDISM 06/03/2007  . ANXIETY 06/03/2007  . ASTHMA 06/03/2007  . Bradycardia     Asymptomatic  . Alopecia areata 05/2002  . Allergy   . Chronic kidney disease   . Anemia    Current Outpatient Prescriptions on File Prior to Visit  Medication Sig Dispense Refill  . albuterol (PROVENTIL HFA;VENTOLIN HFA) 108 (90 BASE) MCG/ACT inhaler Inhale 1 puff into the lungs as needed.      . Calcium Carbonate-Vitamin D (CALTRATE 600+D) 600-400 MG-UNIT per tablet Take 2 tablets by mouth daily.       . Clobetasol Propionate 0.05 % lotion Apply topically 2 (two) times daily.        Marland Kitchen levothyroxine (SYNTHROID, LEVOTHROID) 75 MCG tablet Take 1 tablet (75 mcg total) by mouth daily before breakfast.  90 tablet  1  . minoxidil (ROGAINE) 2 % external solution Apply topically 2 (two) times daily.      . Multiple Vitamin (MULTIVITAMIN) tablet Take 1 tablet by mouth daily.        . polyethylene glycol powder (MIRALAX) powder Take 17 g by mouth daily as needed.        Marland Kitchen ipratropium (ATROVENT) 0.03 % nasal spray Place 2 sprays into the nose 2 (two) times daily.  30 mL  5   No current facility-administered medications on file prior to visit.   Allergies  Allergen Reactions  . Erythromycin     REACTION: Upset Stomach     Review of Systems  Constitutional: Positive for activity change. Negative for fever, chills and diaphoresis.  Gastrointestinal: Negative for  abdominal pain, diarrhea and constipation.  Genitourinary: Positive for genital sores and pelvic pain. Negative for dysuria, vaginal bleeding, vaginal discharge, difficulty urinating and vaginal pain.  Musculoskeletal: Positive for gait problem. Negative for joint swelling.  Skin: Negative for color change, pallor and wound.  Hematological: Negative for adenopathy. Does not bruise/bleed easily.      BP 112/66  Pulse 60  Temp(Src) 98.7 F (37.1 C) (Oral)  Resp 16  Ht 5\' 4"  (1.626 m)  Wt 174 lb 3.2 oz (79.017 kg)  BMI 29.89 kg/m2  SpO2 99%  LMP 04/12/2013 Objective:   Physical Exam  Constitutional: She is oriented to person, place, and time. She appears well-developed and well-nourished. No distress.  HENT:  Head: Normocephalic and atraumatic.  Right Ear: External ear normal.  Eyes: Conjunctivae are normal. No scleral icterus.  Pulmonary/Chest: Effort normal.  Genitourinary:    There is tenderness on the right labia. There is no rash on the right labia. There is no rash or tenderness on the left labia.  Well defined cyst on mid right labia minora - approx 1 cm dm. Tender to palpation, no overlying skin changes.  Neurological: She is alert and oriented to person, place, and time.  Skin: Skin is warm and dry. She  is not diaphoretic. No erythema.  Psychiatric: She has a normal mood and affect. Her behavior is normal.   Right outer labia minora cleaned with betadine x 3. Verbal consent obtained. Anesthesia with 0.5cc of 2% plain lidocaine subcu.  Small 3mm incision made with 11 blade to mid outer labia minora and moderate amount of sanginous drainage expressed with some purulence. Explored with hemostats - no wall or loculations found.    Assessment & Plan:  Labial abscess - Plan: Wound culture To small to pack so rec very freq sitz baths and warm compresses to keep incision open.  Recheck in 2d to ensure purulence has not reaccumulated Meds ordered this encounter  Medications  .  doxycycline (VIBRAMYCIN) 100 MG capsule    Sig: Take 1 capsule (100 mg total) by mouth 2 (two) times daily.    Dispense:  20 capsule    Refill:  0

## 2013-05-15 ENCOUNTER — Ambulatory Visit (INDEPENDENT_AMBULATORY_CARE_PROVIDER_SITE_OTHER): Payer: BC Managed Care – PPO | Admitting: Physician Assistant

## 2013-05-15 VITALS — BP 112/74 | HR 68 | Temp 98.3°F | Resp 18 | Ht 64.0 in | Wt 174.0 lb

## 2013-05-15 DIAGNOSIS — N764 Abscess of vulva: Secondary | ICD-10-CM

## 2013-05-15 NOTE — Progress Notes (Signed)
  Subjective:    Patient ID: Audrey Christian, female    DOB: [birth year], 42 y.o.   MRN: [id number]  HPI 42 year old female presents for recheck of labial cyst/abscess that was drained 2 days ago.  Had swelling of labia major associated with pain.  Had it opened at last OV but not deep enough to need to be packed.  Has noticed some clear drainage but no purulence.  Did start her menses today so is unsure about drainage today.  Overall she states it feels much better today. Tolerating doxycycline. No F/C/N/V.  She is otherwise doing well with no other concerns.     Review of Systems  Constitutional: Negative for fever and chills.  Gastrointestinal: Negative for nausea and vomiting.  Skin: Positive for wound.       Objective:   Physical Exam  Constitutional: She is oriented to person, place, and time. She appears well-developed and well-nourished.  HENT:  Head: Normocephalic and atraumatic.  Right Ear: External ear normal.  Left Ear: External ear normal.  Eyes: Conjunctivae are normal.  Neck: Normal range of motion.  Cardiovascular: Normal rate.   Genitourinary:     Neurological: She is alert and oriented to person, place, and time.  Psychiatric: She has a normal mood and affect. Her behavior is normal. Judgment and thought content normal.          Assessment & Plan:  Labial abscess  Continue wound care as instructed from previous visit Follow up as needed.

## 2013-05-16 LAB — WOUND CULTURE

## 2013-07-18 ENCOUNTER — Other Ambulatory Visit: Payer: Self-pay | Admitting: Endocrinology

## 2013-08-07 ENCOUNTER — Encounter: Payer: Self-pay | Admitting: Family Medicine

## 2013-08-07 ENCOUNTER — Ambulatory Visit (INDEPENDENT_AMBULATORY_CARE_PROVIDER_SITE_OTHER): Payer: BC Managed Care – PPO | Admitting: Family Medicine

## 2013-08-07 VITALS — BP 108/60 | HR 65 | Temp 98.3°F | Resp 16 | Ht 64.5 in | Wt 170.0 lb

## 2013-08-07 DIAGNOSIS — M6530 Trigger finger, unspecified finger: Secondary | ICD-10-CM

## 2013-08-07 DIAGNOSIS — E041 Nontoxic single thyroid nodule: Secondary | ICD-10-CM

## 2013-08-07 DIAGNOSIS — M2550 Pain in unspecified joint: Secondary | ICD-10-CM

## 2013-08-07 DIAGNOSIS — E039 Hypothyroidism, unspecified: Secondary | ICD-10-CM

## 2013-08-07 DIAGNOSIS — Z23 Encounter for immunization: Secondary | ICD-10-CM

## 2013-08-07 LAB — RHEUMATOID FACTOR: Rhuematoid fact SerPl-aCnc: <10 IU/mL (ref <=14)

## 2013-08-07 LAB — POCT CBC
Granulocyte percent: 55.2 %G (ref 37–80)
HCT, POC: 40.9 % (ref 37.7–47.9)
Hemoglobin: 13.3 g/dL (ref 12.2–16.2)
Lymph, poc: 2.1 (ref 0.6–3.4)
MCH, POC: 31.1 pg (ref 27–31.2)
MCHC: 32.5 g/dL (ref 31.8–35.4)
MCV: 95.5 fL (ref 80–97)
MID (cbc): 0.6 (ref 0–0.9)
MPV: 9.9 fL (ref 0–99.8)
POC Granulocyte: 3.4 (ref 2–6.9)
POC LYMPH PERCENT: 35.2 %L (ref 10–50)
POC MID %: 9.6 %M (ref 0–12)
Platelet Count, POC: 265 10*3/uL (ref 142–424)
RBC: 4.28 M/uL (ref 4.04–5.48)
RDW, POC: 13.3 %
WBC: 6.1 10*3/uL (ref 4.6–10.2)

## 2013-08-07 LAB — TSH: TSH: 2.353 u[IU]/mL (ref 0.350–4.500)

## 2013-08-07 LAB — POCT SEDIMENTATION RATE: POCT SED RATE: 21 mm/hr (ref 0–22)

## 2013-08-07 MED ORDER — INFLUENZA VAC SPLIT QUAD 0.5 ML IM SUSP: 0.5000 mL | INTRAMUSCULAR | Status: DC

## 2013-08-07 NOTE — Progress Notes (Signed)
Subjective:    Patient ID: Audrey Christian, female    DOB: Mar 27, 1971, 42 y.o.   MRN: [id number] This chart was scribed for Elvina Sidle, MD by Clydene Laming, ED Scribe. This patient was seen in room Room 4 and the patient's care was started at 4:20 PM. HPI HPI Comments: Audrey Christian is a 42 y.o. female who presents to the [facility] complaining of constant hand pain (both) onset two weeks ago. Pt believes she may have arthritis. She reports it is challenging to scrub, write, and tie shoes.She states her left middle finger tends to get "stuck" while using her hands. She states this is the only finger that is is experiencing "trigger finger" symptoms. Pt denies swelling of the knuckles. She reports taking Natures Pearl w/o relief. Pt has a family hx of Arthritis through her mother. 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   No other joints bothering her.  No rash or oral sores  Past Medical History  Diagnosis Date  . HYPOTHYROIDISM 06/03/2007  . ANXIETY 06/03/2007  . ASTHMA 06/03/2007  . Bradycardia     Asymptomatic  . Alopecia areata 05/2002  . Allergy   . Chronic kidney disease   . Anemia    Past Surgical History  Procedure Laterality Date  . Cholecystectomy  05/2002  . Tubal ligation     Allergies  Allergen Reactions  . Erythromycin     REACTION: Upset Stomach   History   Social History  . Marital Status: Married    Spouse Name: N/A    Number of Children: N/A  . Years of Education: N/A   Occupational History  .      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 Smoking status: Never Smoker   . Smokeless tobacco: Never Used  . Alcohol Use: No  . Drug Use: No  . Sexual Activity: Yes    Birth Control/ Protection: None   Other Topics Concern  . Not on file   Social History Narrative  . No narrative on file         Review of Systems  Constitutional: Negative for fever and chills.  Musculoskeletal: Positive for  arthralgias (of the hands). Negative for joint swelling.  Skin: Negative for rash.       Objective:   Physical Exam  Nursing note and vitals reviewed. Constitutional: She appears well-developed and well-nourished. No distress.  HENT:  Head: Normocephalic and atraumatic.  Eyes: Conjunctivae are normal.  Neck: Normal range of motion.  Cardiovascular: Normal rate, regular rhythm and intact distal pulses.   Capillary refill normal  Pulmonary/Chest: Effort normal and breath sounds normal.  Musculoskeletal: She exhibits tenderness. She exhibits no edema.  ROM: normal  Neurological: She is alert. Coordination normal.  Sensation normal Strength normal  Skin: Skin is warm and dry. She is not diaphoretic.  No tenting of the skin  Psychiatric: She has a normal mood and affect.   Filed Vitals:   08/07/13 1527  BP: 108/60  Pulse: 65  Temp: 98.3 F (36.8 C)  Resp: 16    Trigger finger left middle finger     Assessment & Plan:   Arthralgia - Plan: POCT SEDIMENTATION RATE, POCT CBC, Rheumatoid factor, ANA, TSH  Trigger finger, acquired - Plan: POCT SEDIMENTATION RATE, POCT CBC, Rheumatoid factor, ANA, TSH  Hypothyroid - Plan: TSH  Need for prophylactic vaccination and inoculation against influenza - Plan: influenza vac split quadrivalent PF (  FLUARIX) injection 0.5 mL  Signed, Elvina Sidle, MD

## 2013-08-07 NOTE — Patient Instructions (Signed)
Influenza Vaccine (Flu Vaccine, Inactivated) 2013 2014 What You Need to Know WHY GET VACCINATED?  Influenza ("flu") is a contagious disease that spreads around the [place] every winter, usually between October and May.  Flu is caused by the influenza virus, and can be spread by coughing, sneezing, and close contact.  Anyone can get flu, but the risk of getting flu is highest among children. Symptoms come on suddenly and may last several days. They can include:  Fever or chills.  Sore throat.  Muscle aches.  Fatigue.  Cough.  Headache.  Runny or stuffy nose. Flu can make some people much sicker than others. These people include young children, people 65 and older, pregnant women, and people with certain health conditions such as heart, lung or kidney disease, or a weakened immune system. Flu vaccine is especially important for these people, and anyone in close contact with them. Flu can also lead to pneumonia, and make existing medical conditions worse. It can cause diarrhea and seizures in children. Each year thousands of people in the [place] die from flu, and many more are hospitalized. Flu vaccine is the best protection we have from flu and its complications. Flu vaccine also helps prevent spreading flu from person to person. INACTIVATED FLU VACCINE There are 2 types of influenza vaccine:  You are getting an inactivated flu vaccine, which does not contain any live influenza virus. It is given by injection with a needle, and often called the "flu shot."  A different live, attenuated (weakened) influenza vaccine is sprayed into the nostrils. This vaccine is described in a separate Vaccine Information Statement. Flu vaccine is recommended every year. Children 6 months through 8 years of age should get 2 doses the first year they get vaccinated. Flu viruses are always changing. Each year's flu vaccine is made to protect from viruses that are most likely to cause disease  that year. While flu vaccine cannot prevent all cases of flu, it is our best defense against the disease. Inactivated flu vaccine protects against 3 or 4 different influenza viruses. It takes about 2 weeks for protection to develop after the vaccination, and protection lasts several months to a year. Some illnesses that are not caused by influenza virus are often mistaken for flu. Flu vaccine will not prevent these illnesses. It can only prevent influenza. A "high-dose" flu vaccine is available for people 65 years of age and older. The person giving you the vaccine can tell you more about it. Some inactivated flu vaccine contains a very small amount of a mercury-based preservative called thimerosal. Studies have shown that thimerosal in vaccines is not harmful, but flu vaccines that do not contain a preservative are available. SOME PEOPLE SHOULD NOT GET THIS VACCINE Tell the person who gives you the vaccine:  If you have any severe (life-threatening) allergies. If you ever had a life-threatening allergic reaction after a dose of flu vaccine, or have a severe allergy to any part of this vaccine, you may be advised not to get a dose. Most, but not all, types of flu vaccine contain a small amount of egg.  If you ever had Guillain Barr Syndrome (a severe paralyzing illness, also called GBS). Some people with a history of GBS should not get this vaccine. This should be discussed with your doctor.  If you are not feeling well. They might suggest waiting until you feel better. But you should come back. RISKS OF A VACCINE REACTION With a vaccine, like any medicine, there   is a chance of side effects. These are usually mild and go away on their own. Serious side effects are also possible, but are very rare. Inactivated flu vaccine does not contain live flu virus, sogetting flu from this vaccine is not possible. Brief fainting spells and related symptoms (such as jerking movements) can happen after any medical  procedure, including vaccination. Sitting or lying down for about 15 minutes after a vaccination can help prevent fainting and injuries caused by falls. Tell your doctor if you feel dizzy or lightheaded, or have vision changes or ringing in the ears. Mild problems following inactivated flu vaccine:  Soreness, redness, or swelling where the shot was given.  Hoarseness; sore, red or itchy eyes; or cough.  Fever.  Aches.  Headache.  Itching.  Fatigue. If these problems occur, they usually begin soon after the shot and last 1 or 2 days. Moderate problems following inactivated flu vaccine:  Young children who get inactivated flu vaccine and pneumococcal vaccine (PCV13) at the same time may be at increased risk for seizures caused by fever. Ask your doctor for more information. Tell your doctor if a child who is getting flu vaccine has ever had a seizure. Severe problems following inactivated flu vaccine:  A severe allergic reaction could occur after any vaccine (estimated less than 1 in a million doses).  There is a small possibility that inactivated flu vaccine could be associated with Guillan Barr Syndrome (GBS), no more than 1 or 2 cases per million people vaccinated. This is much lower than the risk of severe complications from flu, which can be prevented by flu vaccine. The safety of vaccines is always being monitored. For more information, visit: www.cdc.gov/vaccinesafety/ WHAT IF THERE IS A SERIOUS REACTION? What should I look for?  Look for anything that concerns you, such as signs of a severe allergic reaction, very high fever, or behavior changes. Signs of a severe allergic reaction can include hives, swelling of the face and throat, difficulty breathing, a fast heartbeat, dizziness, and weakness. These would start a few minutes to a few hours after the vaccination. What should I do?  If you think it is a severe allergic reaction or other emergency that cannot wait, call 9 1 1  or get the person to the nearest hospital. Otherwise, call your doctor.  Afterward, the reaction should be reported to the Vaccine Adverse Event Reporting System (VAERS). Your doctor might file this report, or you can do it yourself through the VAERS website at www.vaers.hhs.gov, or by calling 1-800-822-7967. VAERS is only for reporting reactions. They do not give medical advice. THE NATIONAL VACCINE INJURY COMPENSATION PROGRAM The National Vaccine Injury Compensation Program (VICP) is a federal program that was created to compensate people who may have been injured by certain vaccines. Persons who believe they may have been injured by a vaccine can learn about the program and about filing a claim by calling 1-800-338-2382 or visiting the VICP website at www.hrsa.gov/vaccinecompensation HOW CAN I LEARN MORE?  Ask your doctor.  Call your local or state health department.  Contact the Centers for Disease Control and Prevention (CDC):  Call 1-800-232-4636 (1-800-CDC-INFO) or  Visit CDC's website at www.cdc.gov/flu CDC Inactivated Influenza Vaccine Interim VIS (04/28/12) Document Released: 07/15/2006 Document Revised: 06/14/2012 Document Reviewed: 04/28/2012 Exit[employer] Patient Information 2014 [employer].  

## 2013-08-08 LAB — ANA: Anti Nuclear Antibody(ANA): NEGATIVE

## 2013-10-08 ENCOUNTER — Ambulatory Visit (INDEPENDENT_AMBULATORY_CARE_PROVIDER_SITE_OTHER): Payer: BC Managed Care – PPO | Admitting: Physician Assistant

## 2013-10-08 VITALS — BP 122/80 | HR 68 | Temp 98.1°F | Resp 18 | Ht 63.25 in | Wt 166.6 lb

## 2013-10-08 DIAGNOSIS — J019 Acute sinusitis, unspecified: Secondary | ICD-10-CM

## 2013-10-08 DIAGNOSIS — J329 Chronic sinusitis, unspecified: Secondary | ICD-10-CM

## 2013-10-08 MED ORDER — GUAIFENESIN ER 1200 MG PO TB12: 1.0000 | ORAL_TABLET | Freq: Two times a day (BID) | ORAL | Status: AC

## 2013-10-08 MED ORDER — ALBUTEROL SULFATE HFA 108 (90 BASE) MCG/ACT IN AERS: 1.0000 | INHALATION_SPRAY | RESPIRATORY_TRACT | Status: DC | PRN

## 2013-10-08 MED ORDER — LEVOFLOXACIN 500 MG PO TABS: 500.0000 mg | ORAL_TABLET | Freq: Every day | ORAL | Status: DC

## 2013-10-08 NOTE — Progress Notes (Signed)
   Subjective:    Patient ID: Audrey Christian, female    DOB: 05-05-1971, 43 y.o.   MRN: [id number]  HPI Pt presents to clinic with 2.5 weeks ago with a sore throat and then congestion that did not get better - 6 ago treated for sinus infection with Augmentin and Flonase and she continued the sudafed. She has not started feeling any better since starting on the Augmentin and tomorrow is her last day.  She has significant headaches that are not being relieved by Motrin and Aleve.  She has teeth pain on the upper teeth more so on the left even though she has had root canals on several teeth they still hurt.  She feels terrible.  Review of Systems  Constitutional: Negative for fever and chills.  HENT: Positive for congestion and postnasal drip. Negative for rhinorrhea.   Respiratory: Positive for cough (just to clear her throat).   Neurological: Positive for dizziness and headaches.       Objective:   Physical Exam  Vitals reviewed. Constitutional: She is oriented to person, place, and time. She appears well-developed and well-nourished.  HENT:  Head: Normocephalic and atraumatic.  Right Ear: Hearing, tympanic membrane, external ear and ear canal normal.  Left Ear: Hearing, tympanic membrane, external ear and ear canal normal.  Nose: Mucosal edema (red) present. Right sinus exhibits maxillary sinus tenderness and frontal sinus tenderness. Left sinus exhibits maxillary sinus tenderness and frontal sinus tenderness.  Mouth/Throat: Uvula is midline, oropharynx is clear and moist and mucous membranes are normal.  Eyes: Conjunctivae are normal.  Neck: Normal range of motion.  Cardiovascular: Normal rate, regular rhythm and normal heart sounds.   No murmur heard. Pulmonary/Chest: Effort normal and breath sounds normal.  Lymphadenopathy:    She has cervical adenopathy (AC enlarged and TTP).  Neurological: She is alert and oriented to person, place, and time.  Skin: Skin is warm and dry.    Psychiatric: She has a normal mood and affect. Her behavior is normal. Judgment and thought content normal.      Assessment & Plan:  Sinus infection - Plan: levofloxacin (LEVAQUIN) 500 MG tablet, Guaifenesin (MUCINEX MAXIMUM STRENGTH) 1200 MG TB12, albuterol (PROVENTIL HFA;VENTOLIN HFA) 108 (90 BASE) MCG/ACT inhaler  Treat for sinus infection.  Will change her abx. If she is not better we will consider a sinus CT due to the length of time of her illness.  She will stop the sudafed and start mucinex and continue high fluid doses.  She will use a humidifier and nasal spray. Benny LennertSarah Weber PA-C 10/08/2013 6:17 PM

## 2013-10-10 ENCOUNTER — Other Ambulatory Visit: Payer: Self-pay | Admitting: Endocrinology

## 2013-10-21 ENCOUNTER — Ambulatory Visit (INDEPENDENT_AMBULATORY_CARE_PROVIDER_SITE_OTHER): Payer: BC Managed Care – PPO | Admitting: Physician Assistant

## 2013-10-21 VITALS — BP 122/68 | HR 72 | Temp 98.9°F | Resp 16 | Ht 63.5 in | Wt 168.6 lb

## 2013-10-21 DIAGNOSIS — H1089 Other conjunctivitis: Secondary | ICD-10-CM

## 2013-10-21 DIAGNOSIS — H109 Unspecified conjunctivitis: Secondary | ICD-10-CM

## 2013-10-21 MED ORDER — OFLOXACIN 0.3 % OP SOLN: 1.0000 [drp] | Freq: Four times a day (QID) | OPHTHALMIC | Status: DC

## 2013-10-21 NOTE — Progress Notes (Signed)
   Subjective:    Patient ID: Audrey OrnLinda K Christian, female    DOB: 06/08/1971, 43 y.o.   MRN: [id number]  HPI 43 year old female presents for evaluation of bilateral eye drainage. States her right eye has been had clear, watery drainage for about 1 week. Yesterday her left eye also started draining.  Then, this morning both eyes were matted shut. Denies eye pain, vision changes, headache, dizziness, nausea, or vomiting.  Admits here eyes have been itching.       Review of Systems  Constitutional: Negative for fever and chills.  HENT: Positive for sinus pressure. Negative for congestion.   Eyes: Positive for discharge, redness and itching. Negative for photophobia, pain and visual disturbance.  Respiratory: Negative for cough.   Neurological: Negative for dizziness and headaches.       Objective:   Physical Exam  Constitutional: She is oriented to person, place, and time. She appears well-developed and well-nourished.  HENT:  Head: Normocephalic and atraumatic.  Right Ear: Hearing, tympanic membrane, external ear and ear canal normal.  Left Ear: Hearing, tympanic membrane, external ear and ear canal normal.  Mouth/Throat: Uvula is midline, oropharynx is clear and moist and mucous membranes are normal.  Eyes: EOM and lids are normal. Pupils are equal, round, and reactive to light. Right conjunctiva is injected. Left conjunctiva is injected.  Watery discharge from both eyes  Cardiovascular: Normal rate.   Pulmonary/Chest: Effort normal.  Neurological: She is alert and oriented to person, place, and time.  Psychiatric: She has a normal mood and affect. Her behavior is normal. Judgment and thought content normal.          Assessment & Plan:  Bacterial conjunctivitis - Plan: ofloxacin (OCUFLOX) 0.3 % ophthalmic solution, ofloxacin (OCUFLOX) 0.3 % ophthalmic solution  Start ocuflox as directed Warm compresses 2-3 times daily Good handwashing Follow up if symptoms worsen or fail to  improve.

## 2013-10-21 NOTE — Patient Instructions (Signed)
Bacterial Conjunctivitis  Bacterial conjunctivitis, commonly called pink eye, is an inflammation of the clear membrane that covers the white part of the eye (conjunctiva). The inflammation can also happen on the underside of the eyelids. The blood vessels in the conjunctiva become inflamed causing the eye to become red or pink. Bacterial conjunctivitis may spread easily from one eye to another and from person to person (contagious).   CAUSES   Bacterial conjunctivitis is caused by bacteria. The bacteria may come from your own skin, your upper respiratory tract, or from someone else with bacterial conjunctivitis.  SYMPTOMS   The normally white color of the eye or the underside of the eyelid is usually pink or red. The pink eye is usually associated with irritation, tearing, and some sensitivity to light. Bacterial conjunctivitis is often associated with a thick, yellowish discharge from the eye. The discharge may turn into a crust on the eyelids overnight, which causes your eyelids to stick together. If a discharge is present, there may also be some blurred vision in the affected eye.  DIAGNOSIS   Bacterial conjunctivitis is diagnosed by your caregiver through an eye exam and the symptoms that you report. Your caregiver looks for changes in the surface tissues of your eyes, which may point to the specific type of conjunctivitis. A sample of any discharge may be collected on a cotton-tip swab if you have a severe case of conjunctivitis, if your cornea is affected, or if you keep getting repeat infections that do not respond to treatment. The sample will be sent to a lab to see if the inflammation is caused by a bacterial infection and to see if the infection will respond to antibiotic medicines.  TREATMENT   · Bacterial conjunctivitis is treated with antibiotics. Antibiotic eyedrops are most often used. However, antibiotic ointments are also available. Antibiotics pills are sometimes used. Artificial tears or eye  washes may ease discomfort.  HOME CARE INSTRUCTIONS   · To ease discomfort, apply a cool, clean wash cloth to your eye for 10 20 minutes, 3 4 times a day.  · Gently wipe away any drainage from your eye with a warm, wet washcloth or a cotton ball.  · Wash your hands often with soap and water. Use paper towels to dry your hands.  · Do not share towels or wash cloths. This may spread the infection.  · Change or wash your pillow case every day.  · You should not use eye makeup until the infection is gone.  · Do not operate machinery or drive if your vision is blurred.  · Stop using contacts lenses. Ask your caregiver how to sterilize or replace your contacts before using them again. This depends on the type of contact lenses that you use.  · When applying medicine to the infected eye, do not touch the edge of your eyelid with the eyedrop bottle or ointment tube.  SEEK IMMEDIATE MEDICAL CARE IF:   · Your infection has not improved within 3 days after beginning treatment.  · You had yellow discharge from your eye and it returns.  · You have increased eye pain.  · Your eye redness is spreading.  · Your vision becomes blurred.  · You have a fever or persistent symptoms for more than 2 3 days.  · You have a fever and your symptoms suddenly get worse.  · You have facial pain, redness, or swelling.  MAKE SURE YOU:   · Understand these instructions.  · Will watch your   condition.  · Will get help right away if you are not doing well or get worse.  Document Released: 09/20/2005 Document Revised: 06/14/2012 Document Reviewed: 02/21/2012  [employer]® Patient Information ©2014 [employer].

## 2013-10-27 ENCOUNTER — Ambulatory Visit (INDEPENDENT_AMBULATORY_CARE_PROVIDER_SITE_OTHER): Payer: BC Managed Care – PPO | Admitting: Family Medicine

## 2013-10-27 VITALS — BP 122/78 | HR 119 | Temp 98.0°F | Resp 16 | Ht 63.0 in | Wt 166.0 lb

## 2013-10-27 DIAGNOSIS — R059 Cough, unspecified: Secondary | ICD-10-CM

## 2013-10-27 DIAGNOSIS — J09X2 Influenza due to identified novel influenza A virus with other respiratory manifestations: Secondary | ICD-10-CM

## 2013-10-27 DIAGNOSIS — R05 Cough: Secondary | ICD-10-CM

## 2013-10-27 DIAGNOSIS — R6889 Other general symptoms and signs: Secondary | ICD-10-CM

## 2013-10-27 LAB — POCT INFLUENZA A/B
Influenza A, POC: POSITIVE
Influenza B, POC: NEGATIVE

## 2013-10-27 MED ORDER — HYDROCODONE-HOMATROPINE 5-1.5 MG/5ML PO SYRP: 5.0000 mL | ORAL_SOLUTION | ORAL | Status: DC | PRN

## 2013-10-27 MED ORDER — OSELTAMIVIR PHOSPHATE 75 MG PO CAPS: 75.0000 mg | ORAL_CAPSULE | Freq: Two times a day (BID) | ORAL | Status: DC

## 2013-10-27 NOTE — Progress Notes (Signed)
Subjective: Patient has been having persistent problems for the last several weeks sinuses, then conjunctivitis, and then yesterday started feeling worse with cough, fever to above 101, body aches some, bad headache, generalized malaise, runny nose. She is a Education officer, environmentalchildcare worker. She did have a flu shot this year.  Objective: Ill-appearing rectocolonic it. TMs normal. Throat and has a little erythema down the posterior center of the throat. Neck supple without nodes. Chest clear. Coughing a lot. Heart regular without murmurs.    Results for orders placed in visit on 10/27/13  POCT INFLUENZA A/B      Result Value Range   Influenza A, POC Positive     Influenza B, POC Negative     Assessment: Influenza A  Plan: Tamiflu, Hycodan, rest, fluids, routine care Return if worse.

## 2013-10-27 NOTE — Patient Instructions (Signed)

## 2013-12-19 ENCOUNTER — Ambulatory Visit (INDEPENDENT_AMBULATORY_CARE_PROVIDER_SITE_OTHER): Payer: BC Managed Care – PPO | Admitting: Physician Assistant

## 2013-12-19 VITALS — BP 100/60 | HR 64 | Temp 98.2°F | Resp 16 | Ht 63.0 in | Wt 169.0 lb

## 2013-12-19 DIAGNOSIS — J019 Acute sinusitis, unspecified: Secondary | ICD-10-CM

## 2013-12-19 DIAGNOSIS — R0981 Nasal congestion: Secondary | ICD-10-CM

## 2013-12-19 DIAGNOSIS — J3489 Other specified disorders of nose and nasal sinuses: Secondary | ICD-10-CM

## 2013-12-19 MED ORDER — CEFDINIR 300 MG PO CAPS: 300.0000 mg | ORAL_CAPSULE | Freq: Two times a day (BID) | ORAL | Status: DC

## 2013-12-19 MED ORDER — IPRATROPIUM BROMIDE 0.06 % NA SOLN
2.0000 | Freq: Three times a day (TID) | NASAL | Status: DC
Start: 2013-12-19 — End: 2014-08-21

## 2013-12-19 NOTE — Progress Notes (Signed)
Subjective:    Patient ID: [name], female    DOB: 1970/10/27, 43 y.o.   MRN: [id number]  HPI Primary Physician: Romero Belling, MD  Chief Complaint: URI x 5 days  HPI: 43 y.o. female with history below presents with 5 day history of nasal congestion, sinus pressure, headache, and fatigue. Sinus pressure along the maxillary and frontal sinuses is the worst symptom. Nasal congestion is thick yellow/green. Ears feel full, more so on the left than on the right. No otalgia. Afebrile. No chills. No rhinorrhea, post nasal drip, cough, SOB, or wheezing. She feels like everything is locked into her sinuses. No known sick contacts. She did get the flu vaccine this year. Has been taking OTC cold preps. Works at a daycare center.   Last sinus infection was in December. Took Augmentin and Levaquin to resolve the infection. She noted while she was on the Levaquin her left Achilles tendon was bothering her. It no longer does. She prefers to stay away from Levaquin at this time if possible.    Past Medical History  Diagnosis Date  . HYPOTHYROIDISM 06/03/2007  . ANXIETY 06/03/2007  . ASTHMA 06/03/2007  . Bradycardia     Asymptomatic  . Alopecia areata 05/2002  . Allergy   . Chronic kidney disease   . Anemia      Home Meds: Prior to Admission medications   Medication Sig Start Date End Date Taking? Authorizing Provider  fluticasone (FLONASE) 50 MCG/ACT nasal spray Place into both nostrils daily.   Yes Historical Provider, MD  loratadine (CLARITIN) 10 MG tablet Take 10 mg by mouth daily.   Yes Historical Provider, MD  SALINE NASAL SPRAY NA Place into the nose.   Yes Historical Provider, MD  albuterol (PROVENTIL HFA;VENTOLIN HFA) 108 (90 BASE) MCG/ACT inhaler Inhale 1 puff into the lungs every 4 (four) hours as needed for wheezing. 10/08/13  Yes Morrell Riddle, PA-C  Calcium Carbonate-Vitamin D (CALTRATE 600+D) 600-400 MG-UNIT per tablet Take 2 tablets by mouth daily.    Yes Historical Provider,  MD  Clobetasol Propionate 0.05 % lotion Apply topically 2 (two) times daily.     Yes Historical Provider, MD  dextromethorphan-guaiFENesin (MUCINEX DM) 30-600 MG per 12 hr tablet Take 1 tablet by mouth 2 (two) times daily.   Yes Historical Provider, MD  levothyroxine (SYNTHROID, LEVOTHROID) 75 MCG tablet TAKE ONE TABLET BY MOUTH ONCE DAILY BEFORE  BREAKFAST 10/10/13  Yes Romero Belling, MD  Multiple Vitamin (MULTIVITAMIN) tablet Take 1 tablet by mouth daily.     Yes Historical Provider, MD  Naproxen Sodium (ALEVE PO) Take by mouth.   Yes Historical Provider, MD  polyethylene glycol powder (MIRALAX) powder Take 17 g by mouth daily as needed.     Yes Historical Provider, MD    Allergies:  Allergies  Allergen Reactions  . Erythromycin     REACTION: Upset Stomach    History   Social History  . Marital Status: Married    Spouse Name: N/A    Number of Children: N/A  . Years of Education: N/A   Occupational History  .      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 Smoking status: Never Smoker   . Smokeless tobacco: Never Used  . Alcohol Use: No  . Drug Use: No  . Sexual Activity: Yes    Birth Control/ Protection: None   Other Topics Concern  . Not on file   Social History Narrative  .  No narrative on file     Review of Systems  Constitutional: Positive for appetite change and fatigue. Negative for fever and chills.       Pushing fluids.   HENT: Positive for congestion, hearing loss and sinus pressure. Negative for ear pain, postnasal drip, rhinorrhea and sore throat.        Nasal congestion. Ears feel full more so on the left.    Respiratory: Negative for cough, shortness of breath and wheezing.        She feels like she does not have her full breath because she cannot breathe that great through her nose.   Gastrointestinal: Negative for nausea, vomiting and diarrhea.  Neurological: Positive for headaches.       Sinus headache.        Objective:   Physical  Exam  Physical Exam: Blood pressure 100/60, pulse 64, temperature 98.2 F (36.8 C), temperature source Oral, resp. rate 16, height 5\' 3"  (1.6 m), weight 169 lb (76.658 kg), last menstrual period 12/05/2013, SpO2 98.00%., Body mass index is 29.94 kg/(m^2). General: Well developed, well nourished, in no acute distress. Head: Normocephalic, atraumatic, eyes without discharge, sclera non-icteric, nares are congested. Bilateral auditory canals clear, TM's are without perforation, pearly grey with reflective cone of light bilaterally. Maxillary and frontal sinus TTP. Oral cavity moist, dentition normal. Posterior pharynx with post nasal drip and mild erythema. No peritonsillar abscess or tonsillar exudate. Uvula midline. No nuchal rigidity.  Neck: Supple. No thyromegaly. Full ROM. No lymphadenopathy. Lungs: Coarse breath sounds bilaterally without wheezes, rales, or rhonchi. Breathing is unlabored.  Heart: RRR with S1 S2. No murmurs, rubs, or gallops appreciated. Msk:  Strength and tone normal for age. Extremities: No clubbing or cyanosis. No edema. Neuro: Alert and oriented X 3. Moves all extremities spontaneously. CNII-XII grossly in tact. Psych:  Responds to questions appropriately with a normal affect.        Assessment & Plan:  43 year old female with sinusitis  -Omnicef 300 mg 1 po bid #20 no RF  -Atrovent NS 0.06% 2 sprays each nare bid prn #1 no RF -Continue Mucinex -Continue Flonase -Continue saline nasal spray  -Avoid Levaquin secondary to Achilles tendon soreness back in December  -RTC precautions    Eula Listenyan Dunn, MHS, PA-C [facility] [address] [place], [place] [phone] [facility] 12/19/2013 4:14 PM

## 2014-01-10 ENCOUNTER — Ambulatory Visit (INDEPENDENT_AMBULATORY_CARE_PROVIDER_SITE_OTHER): Payer: BC Managed Care – PPO | Admitting: Emergency Medicine

## 2014-01-10 VITALS — BP 110/80 | HR 62 | Temp 98.5°F | Resp 16 | Ht 62.5 in | Wt 173.0 lb

## 2014-01-10 DIAGNOSIS — S335XXA Sprain of ligaments of lumbar spine, initial encounter: Secondary | ICD-10-CM

## 2014-01-10 MED ORDER — NAPROXEN SODIUM 550 MG PO TABS: 550.0000 mg | ORAL_TABLET | Freq: Two times a day (BID) | ORAL | Status: DC

## 2014-01-10 MED ORDER — ACETAMINOPHEN-CODEINE #3 300-30 MG PO TABS: 1.0000 | ORAL_TABLET | ORAL | Status: DC | PRN

## 2014-01-10 MED ORDER — CYCLOBENZAPRINE HCL 10 MG PO TABS: 10.0000 mg | ORAL_TABLET | Freq: Three times a day (TID) | ORAL | Status: DC | PRN

## 2014-01-10 NOTE — Patient Instructions (Signed)
Lumbosacral Strain Lumbosacral strain is a strain of any of the parts that make up your lumbosacral vertebrae. Your lumbosacral vertebrae are the bones that make up the lower third of your backbone. Your lumbosacral vertebrae are held together by muscles and tough, fibrous tissue (ligaments).  CAUSES  A sudden blow to your back can cause lumbosacral strain. Also, anything that causes an excessive stretch of the muscles in the low back can cause this strain. This is typically seen when people exert themselves strenuously, fall, lift heavy objects, bend, or crouch repeatedly. RISK FACTORS  Physically demanding work.  Participation in pushing or pulling sports or sports that require sudden twist of the back (tennis, golf, baseball).  Weight lifting.  Excessive lower back curvature.  Forward-tilted pelvis.  Weak back or abdominal muscles or both.  Tight hamstrings. SIGNS AND SYMPTOMS  Lumbosacral strain may cause pain in the area of your injury or pain that moves (radiates) down your leg.  DIAGNOSIS Your health care provider can often diagnose lumbosacral strain through a physical exam. In some cases, you may need tests such as X-ray exams.  TREATMENT  Treatment for your lower back injury depends on many factors that your clinician will have to evaluate. However, most treatment will include the use of anti-inflammatory medicines. HOME CARE INSTRUCTIONS   Avoid hard physical activities (tennis, racquetball, waterskiing) if you are not in proper physical condition for it. This may aggravate or create problems.  If you have a back problem, avoid sports requiring sudden body movements. Swimming and walking are generally safer activities.  Maintain good posture.  Maintain a healthy weight.  For acute conditions, you may put ice on the injured area.  Put ice in a plastic bag.  Place a towel between your skin and the bag.  Leave the ice on for 20 minutes, 2 3 times a day.  When the  low back starts healing, stretching and strengthening exercises may be recommended. SEEK MEDICAL CARE IF:  Your back pain is getting worse.  You experience severe back pain not relieved with medicines. SEEK IMMEDIATE MEDICAL CARE IF:   You have numbness, tingling, weakness, or problems with the use of your arms or legs.  There is a change in bowel or bladder control.  You have increasing pain in any area of the body, including your belly (abdomen).  You notice shortness of breath, dizziness, or feel faint.  You feel sick to your stomach (nauseous), are throwing up (vomiting), or become sweaty.  You notice discoloration of your toes or legs, or your feet get very cold. MAKE SURE YOU:   Understand these instructions.  Will watch your condition.  Will get help right away if you are not doing well or get worse. Document Released: 06/30/2005 Document Revised: 07/11/2013 Document Reviewed: 05/09/2013 [employer] Patient Information 2014 [employer].  

## 2014-01-10 NOTE — Progress Notes (Signed)
[facility] [address] [place] [address] [phone]  Date:  01/10/2014   Name:  CABELA PACIFICO   DOB:  [birth year]   MRN:  [id number]  PCP:  Romero Belling, MD    Chief Complaint: Back Pain   History of Present Illness:  Audrey Christian is a 43 y.o. very pleasant female patient who presents with the following:  2-3 day history of low back pain.  Non radiating.  No history of injury.  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  No numbness or weakness or other neuro symptoms.  No improvement with over the counter medications or other home remedies. Denies other complaint or health concern today.   Patient Active Problem List   Diagnosis Date Noted  . Iron deficiency anemia, unspecified 12/13/2012  . Routine general medical examination at a health care facility 12/13/2012  . URI 02/13/2009  . OTHER SYMPTOMS INVOLVING HEAD AND NECK 12/12/2007  . HYPOTHYROIDISM 06/03/2007  . ANXIETY 06/03/2007  . ALLERGIC RHINITIS 06/03/2007  . ASTHMA 06/03/2007    Past Medical History  Diagnosis Date  . HYPOTHYROIDISM 06/03/2007  . ANXIETY 06/03/2007  . ASTHMA 06/03/2007  . Bradycardia     Asymptomatic  . Alopecia areata 05/2002  . Allergy   . Chronic kidney disease   . Anemia     Past Surgical History  Procedure Laterality Date  . Cholecystectomy  05/2002  . Tubal ligation      History  Substance Use Topics  . Smoking status: Never Smoker   . Smokeless tobacco: Never Used  . Alcohol Use: No    Family History  Problem Relation Age of Onset  . Hypothyroidism Mother   . Hypertension Mother   . Cancer Father   . Cancer Maternal Grandmother   . Cancer Maternal Grandfather   . Cancer Paternal Grandmother   . Cancer Paternal Grandfather     Allergies  Allergen Reactions  . Erythromycin     REACTION: Upset Stomach    Medication list has been reviewed and updated.  Current Outpatient Prescriptions  on File Prior to Visit  Medication Sig Dispense Refill  . albuterol (PROVENTIL HFA;VENTOLIN HFA) 108 (90 BASE) MCG/ACT inhaler Inhale 1 puff into the lungs every 4 (four) hours as needed for wheezing.  1 Inhaler  0  . Calcium Carbonate-Vitamin D (CALTRATE 600+D) 600-400 MG-UNIT per tablet Take 2 tablets by mouth daily.       . Clobetasol Propionate 0.05 % lotion Apply topically 2 (two) times daily.        . fluticasone (FLONASE) 50 MCG/ACT nasal spray Place into both nostrils daily.      Marland Kitchen ipratropium (ATROVENT) 0.06 % nasal spray Place 2 sprays into the nose 3 (three) times daily.  15 mL  0  . levothyroxine (SYNTHROID, LEVOTHROID) 75 MCG tablet TAKE ONE TABLET BY MOUTH ONCE DAILY BEFORE  BREAKFAST  90 tablet  0  . loratadine (CLARITIN) 10 MG tablet Take 10 mg by mouth daily.      . polyethylene glycol powder (MIRALAX) powder Take 17 g by mouth daily as needed.        . cefdinir (OMNICEF) 300 MG capsule Take 1 capsule (300 mg total) by mouth 2 (two) times daily.  20 capsule  0  . dextromethorphan-guaiFENesin (MUCINEX DM) 30-600 MG per 12 hr tablet Take 1 tablet by mouth 2 (two) times daily.      Marland Kitchen  Multiple Vitamin (MULTIVITAMIN) tablet Take 1 tablet by mouth daily.        . Naproxen Sodium (ALEVE PO) Take by mouth.      Marland Kitchen. SALINE NASAL SPRAY NA Place into the nose.       No current facility-administered medications on file prior to visit.    Review of Systems:  As per HPI, otherwise negative.    Physical Examination: Filed Vitals:   01/10/14 1527  BP: 110/80  Pulse: 62  Temp: 98.5 F (36.9 C)  Resp: 16   Filed Vitals:   01/10/14 1527  Height: 5' 2.5" (1.588 m)  Weight: 173 lb (78.472 kg)   Body mass index is 31.12 kg/(m^2). Ideal Body Weight: Weight in (lb) to have BMI = 25: 138.6  GEN: WDWN, moderate distress, Non-toxic, A & O x 3 HEENT: Atraumatic, Normocephalic. Neck supple. No masses, No LAD. Ears and Nose: No external deformity. CV: RRR, No M/G/R. No JVD. No thrill. No  extra heart sounds. PULM: CTA B, no wheezes, crackles, rhonchi. No retractions. No resp. distress. No accessory muscle use. ABD: S, NT, ND, +BS. No rebound. No HSM. EXTR: No c/c/e NEURO antalgicgait.  PSYCH: Normally interactive. Conversant. Not depressed or anxious appearing.  Calm demeanor.  Back:  Marked tenderness left para vertebral muscles.  Motor normal lowers  Assessment and Plan: Lumbar strain Anaprox Flexeril tyl #3  Signed,  Phillips OdorJeffery Anderson, MD

## 2014-01-16 ENCOUNTER — Other Ambulatory Visit: Payer: Self-pay | Admitting: Endocrinology

## 2014-02-22 ENCOUNTER — Ambulatory Visit (INDEPENDENT_AMBULATORY_CARE_PROVIDER_SITE_OTHER): Payer: BC Managed Care – PPO | Admitting: Endocrinology

## 2014-02-22 ENCOUNTER — Encounter: Payer: Self-pay | Admitting: Endocrinology

## 2014-02-22 VITALS — BP 122/82 | HR 63 | Temp 98.2°F | Ht 62.0 in | Wt 175.0 lb

## 2014-02-22 DIAGNOSIS — D509 Iron deficiency anemia, unspecified: Secondary | ICD-10-CM

## 2014-02-22 DIAGNOSIS — E039 Hypothyroidism, unspecified: Secondary | ICD-10-CM

## 2014-02-22 LAB — CBC WITH DIFFERENTIAL/PLATELET
BASOS ABS: 0.1 10*3/uL (ref 0.0–0.1)
Basophils Relative: 1.4 % (ref 0.0–3.0)
EOS ABS: 0.3 10*3/uL (ref 0.0–0.7)
Eosinophils Relative: 5.5 % — ABNORMAL HIGH (ref 0.0–5.0)
HCT: 36.2 % (ref 36.0–46.0)
Hemoglobin: 12.2 g/dL (ref 12.0–15.0)
LYMPHS PCT: 31.2 % (ref 12.0–46.0)
Lymphs Abs: 1.7 10*3/uL (ref 0.7–4.0)
MCHC: 33.6 g/dL (ref 30.0–36.0)
MCV: 91 fl (ref 78.0–100.0)
MONOS PCT: 15 % — AB (ref 3.0–12.0)
Monocytes Absolute: 0.8 10*3/uL (ref 0.1–1.0)
Neutro Abs: 2.6 10*3/uL (ref 1.4–7.7)
Neutrophils Relative %: 46.9 % (ref 43.0–77.0)
Platelets: 275 10*3/uL (ref 150.0–400.0)
RBC: 3.98 Mil/uL (ref 3.87–5.11)
RDW: 13.4 % (ref 11.5–15.5)
WBC: 5.5 10*3/uL (ref 4.0–10.5)

## 2014-02-22 LAB — TSH: TSH: 1.04 u[IU]/mL (ref 0.35–4.50)

## 2014-02-22 LAB — IBC PANEL
IRON: 73 ug/dL (ref 42–145)
SATURATION RATIOS: 20.1 % (ref 20.0–50.0)
TRANSFERRIN: 259.4 mg/dL (ref 212.0–360.0)

## 2014-02-22 NOTE — Progress Notes (Signed)
Subjective:    Patient ID: Audrey Christian, female    DOB: Feb 04, 1971, 43 y.o.   MRN: [id number]  HPI Pt returns for f/u of chronic hypothyroidism (dx'ed approx 1997; most recent us in 2009 showed several small cysts).  pt states she feels well in general. She has h/o fe-deficiency.  She no longer takes fe tabs.   Past Medical History  Diagnosis Date  . HYPOTHYROIDISM 06/03/2007  . ANXIETY 06/03/2007  . ASTHMA 06/03/2007  . Bradycardia     Asymptomatic  . Alopecia areata 05/2002  . Allergy   . Chronic kidney disease   . Anemia     Past Surgical History  Procedure Laterality Date  . Cholecystectomy  05/2002  . Tubal ligation      History   Social History  . Marital Status: Married    Spouse Name: N/A    Number of Children: N/A  . Years of Education: N/A   Occupational History  .      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 Smoking status: Never Smoker   . Smokeless tobacco: Never Used  . Alcohol Use: No  . Drug Use: No  . Sexual Activity: Yes    Birth Control/ Protection: None   Other Topics Concern  . Not on file   Social History Narrative  . No narrative on file    Current Outpatient Prescriptions on File Prior to Visit  Medication Sig Dispense Refill  . acetaminophen-codeine (TYLENOL #3) 300-30 MG per tablet Take 1-2 tablets by mouth every 4 (four) hours as needed.  30 tablet  0  . albuterol (PROVENTIL HFA;VENTOLIN HFA) 108 (90 BASE) MCG/ACT inhaler Inhale 1 puff into the lungs every 4 (four) hours as needed for wheezing.  1 Inhaler  0  . Calcium Carbonate-Vitamin D (CALTRATE 600+D) 600-400 MG-UNIT per tablet Take 2 tablets by mouth daily.       . Clobetasol Propionate 0.05 % lotion Apply topically 2 (two) times daily.        Marland Kitchen. dextromethorphan-guaiFENesin (MUCINEX DM) 30-600 MG per 12 hr tablet Take 1 tablet by mouth 2 (two) times daily.      . fluticasone (FLONASE) 50 MCG/ACT nasal spray Place into both nostrils daily.      Marland Kitchen. levothyroxine  (SYNTHROID, LEVOTHROID) 75 MCG tablet TAKE ONE TABLET BY MOUTH ONCE DAILY BEFORE BREAKFAST.  30 tablet  1  . loratadine (CLARITIN) 10 MG tablet Take 10 mg by mouth daily.      . Multiple Vitamin (MULTIVITAMIN) tablet Take 1 tablet by mouth daily.        . cyclobenzaprine (FLEXERIL) 10 MG tablet Take 1 tablet (10 mg total) by mouth 3 (three) times daily as needed for muscle spasms.  30 tablet  0  . ipratropium (ATROVENT) 0.06 % nasal spray Place 2 sprays into the nose 3 (three) times daily.  15 mL  0  . Naproxen Sodium (ALEVE PO) Take by mouth.      . naproxen sodium (ANAPROX DS) 550 MG tablet Take 1 tablet (550 mg total) by mouth 2 (two) times daily with a meal.  40 tablet  0  . polyethylene glycol powder (MIRALAX) powder Take 17 g by mouth daily as needed.        Marland Kitchen. SALINE NASAL SPRAY NA Place into the nose.       No current facility-administered medications on file prior to visit.    Allergies  Allergen Reactions  . Erythromycin  REACTION: Upset Stomach    Family History  Problem Relation Age of Onset  . Hypothyroidism Mother   . Hypertension Mother   . Cancer Father   . Cancer Maternal Grandmother   . Cancer Maternal Grandfather   . Cancer Paternal Grandmother   . Cancer Paternal Grandfather     BP 122/82  Pulse 63  Temp(Src) 98.2 F (36.8 C) (Oral)  Ht 5\' 2"  (1.575 m)  Wt 175 lb (79.379 kg)  BMI 32.00 kg/m2  SpO2 98%    Review of Systems She has heavy menses.    Objective:   Physical Exam VITAL SIGNS:  See vs page GENERAL: no distress NECK: There is no palpable thyroid enlargement.  No thyroid nodule is palpable.  No palpable lymphadenopathy at the anterior neck.   Lab Results  Component Value Date   TSH 1.04 02/22/2014   Lab Results  Component Value Date   WBC 5.5 02/22/2014   HGB 12.2 02/22/2014   HCT 36.2 02/22/2014   MCV 91.0 02/22/2014   PLT 275.0 02/22/2014        Assessment & Plan:  Hypothyroidism: well-replaced fe-def anemia: resolved

## 2014-02-22 NOTE — Patient Instructions (Addendum)
blood tests are being requested for you today.  We'll contact you with results. Please return in 1 year. Please continue the same medications.

## 2014-03-14 ENCOUNTER — Other Ambulatory Visit: Payer: Self-pay | Admitting: Endocrinology

## 2014-03-17 ENCOUNTER — Ambulatory Visit (INDEPENDENT_AMBULATORY_CARE_PROVIDER_SITE_OTHER): Payer: BLUE CROSS/BLUE SHIELD

## 2014-03-17 ENCOUNTER — Ambulatory Visit (INDEPENDENT_AMBULATORY_CARE_PROVIDER_SITE_OTHER): Payer: BLUE CROSS/BLUE SHIELD | Admitting: Internal Medicine

## 2014-03-17 ENCOUNTER — Ambulatory Visit: Payer: BC Managed Care – PPO

## 2014-03-17 VITALS — BP 114/62 | HR 70 | Temp 98.1°F | Resp 15 | Ht 63.5 in | Wt 177.8 lb

## 2014-03-17 DIAGNOSIS — M65849 Other synovitis and tenosynovitis, unspecified hand: Secondary | ICD-10-CM

## 2014-03-17 DIAGNOSIS — M79609 Pain in unspecified limb: Secondary | ICD-10-CM

## 2014-03-17 DIAGNOSIS — M79641 Pain in right hand: Secondary | ICD-10-CM

## 2014-03-17 DIAGNOSIS — M659 Synovitis and tenosynovitis, unspecified: Secondary | ICD-10-CM

## 2014-03-17 DIAGNOSIS — M65839 Other synovitis and tenosynovitis, unspecified forearm: Secondary | ICD-10-CM | POA: Diagnosis not present

## 2014-03-17 MED ORDER — INDOMETHACIN ER 75 MG PO CPCR: 75.0000 mg | ORAL_CAPSULE | Freq: Two times a day (BID) | ORAL | Status: DC

## 2014-03-17 MED ORDER — AMOXICILLIN-POT CLAVULANATE 875-125 MG PO TABS: 1.0000 | ORAL_TABLET | Freq: Two times a day (BID) | ORAL | Status: DC

## 2014-03-17 NOTE — Progress Notes (Signed)
   Subjective:    Patient ID: Audrey Christian, female    DOB: September 02, 1971, 43 y.o.   MRN: [id number]  HPI Complaining of pain in her right hand for the last 3 days with swelling but without history of injury This is focused around her index finger proximally. She suffered a burn on her middle phalanx about 2 weeks ago which is slowly healing. The pain radiates along the dorsal aspect of her index finger and extends to the mid forearm Shots and has pain on the volar aspect of the index MCP. No history of gout or arthritis   Review of Systems No fever chills or night sweats No new fatigue Nonew medications    Objective:   Physical Exam BP 114/62  Pulse 70  Temp(Src) 98.1 F (36.7 C) (Oral)  Resp 15  Ht 5' 3.5" (1.613 m)  Wt 177 lb 12.8 oz (80.65 kg)  BMI 31.00 kg/m2  SpO2 97%  LMP 02/11/2014 The index finger of the right hand has swelling of the MCP and proximal phalanx without erythema She is very tender to palpation of the extensor tendon from mid finger to Distal forearm without obvious streaking erythema There is a healing scabbed lesion on the dorsal aspect of the middle phalanx index finger that is still tender but without pus or redness Flexion is limited by pain  [facility] reading (PRIMARY) by  Dr. Merla Richesoolittle= x-ray  reveals no acute process or chronic lesions       Assessment & Plan:  Hand pain, right - Plan: DG Hand Complete Right, CANCELED: DG Wrist Complete Right  Tenosynovitis of finger, hand, or wrist  Meds ordered this encounter  Medications  . indomethacin (INDOCIN SR) 75 MG CR capsule    Sig: Take 1 capsule (75 mg total) by mouth 2 (two) times daily with a meal.    Dispense:  30 capsule    Refill:  0  . amoxicillin-clavulanate (AUGMENTIN) 875-125 MG per tablet    Sig: Take 1 tablet by mouth 2 (two) times daily.    Dispense:  20 tablet    Refill:  0   The etiology is unclear Because there is pain extending into the forearm along a line from her recent  wound I will cover for infection She is to followup in 48 hours if not dramatically improved

## 2014-04-09 ENCOUNTER — Other Ambulatory Visit: Payer: Self-pay | Admitting: Endocrinology

## 2014-05-10 ENCOUNTER — Other Ambulatory Visit: Payer: Self-pay | Admitting: Endocrinology

## 2014-08-14 ENCOUNTER — Other Ambulatory Visit: Payer: Self-pay | Admitting: Endocrinology

## 2014-08-19 ENCOUNTER — Telehealth: Payer: Self-pay | Admitting: Endocrinology

## 2014-08-21 ENCOUNTER — Ambulatory Visit (INDEPENDENT_AMBULATORY_CARE_PROVIDER_SITE_OTHER): Payer: BC Managed Care – PPO | Admitting: Endocrinology

## 2014-08-21 ENCOUNTER — Encounter: Payer: Self-pay | Admitting: Endocrinology

## 2014-08-21 VITALS — BP 112/70 | HR 59 | Temp 97.9°F | Ht 63.5 in | Wt 174.0 lb

## 2014-08-21 DIAGNOSIS — E039 Hypothyroidism, unspecified: Secondary | ICD-10-CM

## 2014-08-21 DIAGNOSIS — D509 Iron deficiency anemia, unspecified: Secondary | ICD-10-CM

## 2014-08-21 NOTE — Progress Notes (Signed)
Subjective:    Patient ID: Audrey Christian, female    DOB: [birth year], 43 y.o.   MRN: [id number]  HPI Pt returns for f/u of chronic hypothyroidism (dx'ed approx 1997; most recent us in 2009 showed several small cysts).  She takes synthroid as rx'ed.  pt states she feels well in general, except for fatigue.  She has had TL.   Past Medical History  Diagnosis Date  . HYPOTHYROIDISM 06/03/2007  . ANXIETY 06/03/2007  . ASTHMA 06/03/2007  . Bradycardia     Asymptomatic  . Alopecia areata 05/2002  . Allergy   . Chronic kidney disease   . Anemia     Past Surgical History  Procedure Laterality Date  . Cholecystectomy  05/2002  . Tubal ligation      History   Social History  . Marital Status: Married    Spouse Name: N/A    Number of Children: N/A  . Years of Education: N/A   Occupational History  .      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 Smoking status: Never Smoker   . Smokeless tobacco: Never Used  . Alcohol Use: No  . Drug Use: No  . Sexual Activity: Yes    Birth Control/ Protection: None   Other Topics Concern  . Not on file   Social History Narrative    Current Outpatient Prescriptions on File Prior to Visit  Medication Sig Dispense Refill  . albuterol (PROVENTIL HFA;VENTOLIN HFA) 108 (90 BASE) MCG/ACT inhaler Inhale 1 puff into the lungs every 4 (four) hours as needed for wheezing. 1 Inhaler 0  . Calcium Carbonate-Vitamin D (CALTRATE 600+D) 600-400 MG-UNIT per tablet Take 2 tablets by mouth daily.     . fluticasone (FLONASE) 50 MCG/ACT nasal spray Place into both nostrils daily.    Marland Kitchen. levothyroxine (SYNTHROID, LEVOTHROID) 75 MCG tablet TAKE ONE TABLET BY MOUTH ONCE DAILY BEFORE BREAKFAST. NEED OFFICE VISIT 30 tablet 0  . Multiple Vitamin (MULTIVITAMIN) tablet Take 1 tablet by mouth daily.      . Naproxen Sodium (ALEVE PO) Take by mouth.    . polyethylene glycol powder (MIRALAX) powder Take 17 g by mouth daily as needed.      . loratadine (CLARITIN)  10 MG tablet Take 10 mg by mouth daily.    Marland Kitchen. SALINE NASAL SPRAY NA Place into the nose.     No current facility-administered medications on file prior to visit.    Allergies  Allergen Reactions  . Erythromycin     REACTION: Upset Stomach    Family History  Problem Relation Age of Onset  . Hypothyroidism Mother   . Hypertension Mother   . Cancer Father   . Cancer Maternal Grandmother   . Cancer Maternal Grandfather   . Cancer Paternal Grandmother   . Cancer Paternal Grandfather     BP 112/70 mmHg  Pulse 59  Temp(Src) 97.9 F (36.6 C) (Oral)  Ht 5' 3.5" (1.613 m)  Wt 174 lb (78.926 kg)  BMI 30.34 kg/m2  SpO2 97%    Review of Systems Denies edema.      Objective:   Physical Exam VITAL SIGNS:  See vs page GENERAL: no distress NECK: There is no palpable thyroid enlargement.  No thyroid nodule is palpable.  No palpable lymphadenopathy at the anterior neck.    Lab Results  Component Value Date   TSH 2.394 08/21/2014      Assessment & Plan:  Hypothyroidism, well-replaced.  Patient is advised the following: Patient Instructions  A thyroid blood test is requested for you today.  We'll contact you with results. Please return in 1 year.   Please continue the same medication.

## 2014-08-21 NOTE — Patient Instructions (Addendum)
A thyroid blood test is requested for you today.  We'll contact you with results. Please return in 1 year.   Please continue the same medication.

## 2014-08-22 LAB — TSH: TSH: 2.394 u[IU]/mL (ref 0.350–4.500)

## 2014-09-11 ENCOUNTER — Other Ambulatory Visit: Payer: Self-pay | Admitting: Endocrinology

## 2014-10-02 NOTE — Telephone Encounter (Signed)
error 

## 2014-10-09 ENCOUNTER — Other Ambulatory Visit: Payer: Self-pay | Admitting: Endocrinology

## 2014-12-17 ENCOUNTER — Ambulatory Visit (INDEPENDENT_AMBULATORY_CARE_PROVIDER_SITE_OTHER): Payer: BLUE CROSS/BLUE SHIELD | Admitting: Physician Assistant

## 2014-12-17 VITALS — BP 114/68 | HR 80 | Temp 98.0°F | Resp 17 | Ht 64.0 in | Wt 172.0 lb

## 2014-12-17 DIAGNOSIS — H6981 Other specified disorders of Eustachian tube, right ear: Secondary | ICD-10-CM

## 2014-12-17 MED ORDER — PSEUDOEPHEDRINE HCL 60 MG PO TABS: 60.0000 mg | ORAL_TABLET | Freq: Four times a day (QID) | ORAL | Status: DC | PRN

## 2014-12-17 NOTE — Patient Instructions (Signed)
-  Continue the flonase daily -Continue the claritin -Please drink plenty of water. -You can also do nasal saline drops over the counter.

## 2014-12-17 NOTE — Progress Notes (Signed)
[facility] [address], [place] [id number] [phone]  Date:  12/17/2014   Name:  Audrey OrnLinda K Douthit   DOB:  1971-01-02   MRN:  [id number]  PCP:  Romero BellingELLISON, SEAN, MD    Chief Complaint: Ear Pain   History of Present Illness:  Audrey Christian is a 44 y.o. very pleasant female patient who presents with the following:  Patient reports left sided neck pain, with ear pain for that progressively worsened today after 1 week of nasal congestion.  She has no rhinorrhea, but post nasal drip with little cough.  She denies dyspnea or sob.  She has no fever.   She has taken Claritin for 1 day, Flonase, and mucinex which has helped very little.     Patient Active Problem List   Diagnosis Date Noted  . Iron deficiency anemia 12/13/2012  . Routine general medical examination at a health care facility 12/13/2012  . Hypothyroidism 06/03/2007  . ALLERGIC RHINITIS 06/03/2007  . ASTHMA 06/03/2007    Past Medical History  Diagnosis Date  . HYPOTHYROIDISM 06/03/2007  . ANXIETY 06/03/2007  . ASTHMA 06/03/2007  . Bradycardia     Asymptomatic  . Alopecia areata 05/2002  . Allergy   . Chronic kidney disease   . Anemia     Past Surgical History  Procedure Laterality Date  . Cholecystectomy  05/2002  . Tubal ligation      History  Substance Use Topics  . Smoking status: Never Smoker   . Smokeless tobacco: Never Used  . Alcohol Use: No    Family History  Problem Relation Age of Onset  . Hypothyroidism Mother   . Hypertension Mother   . Cancer Father   . Cancer Maternal Grandmother   . Cancer Maternal Grandfather   . Cancer Paternal Grandmother   . Cancer Paternal Grandfather     Allergies  Allergen Reactions  . Erythromycin     REACTION: Upset Stomach    Medication list has been reviewed and updated.  Current Outpatient Prescriptions on File Prior to Visit  Medication Sig Dispense Refill  . Calcium Carbonate-Vitamin D (CALTRATE 600+D) 600-400 MG-UNIT  per tablet Take 2 tablets by mouth daily.     . fluticasone (FLONASE) 50 MCG/ACT nasal spray Place into both nostrils daily.    Marland Kitchen. levothyroxine (SYNTHROID, LEVOTHROID) 75 MCG tablet Take 1 tablet by mouth once daily before breakfast. 30 tablet 3  . loratadine (CLARITIN) 10 MG tablet Take 10 mg by mouth daily.    . Multiple Vitamin (MULTIVITAMIN) tablet Take 1 tablet by mouth daily.      . Naproxen Sodium (ALEVE PO) Take by mouth.    . polyethylene glycol powder (MIRALAX) powder Take 17 g by mouth daily as needed.      Marland Kitchen. SALINE NASAL SPRAY NA Place into the nose.     No current facility-administered medications on file prior to visit.    Review of Systems: ROS otherwise unremarkable unless listed above.    Physical Examination: Filed Vitals:   12/17/14 1758  BP: 114/68  Pulse: 80  Temp: 98 F (36.7 C)  Resp: 17   Filed Vitals:   12/17/14 1758  Height: 5\' 4"  (1.626 m)  Weight: 172 lb (78.019 kg)   Body mass index is 29.51 kg/(m^2). Ideal Body Weight: Weight in (lb) to have BMI = 25: 145.3  Physical Exam  Constitutional: She is oriented to person, place, and time. She appears well-developed and well-nourished. No distress.  HENT:  Head: Normocephalic and atraumatic.  Right Ear: Tympanic membrane, external ear and ear canal normal.  Left Ear: Tympanic membrane, external ear and ear canal normal.  Nose: Mucosal edema and rhinorrhea present. Right sinus exhibits frontal sinus tenderness. Right sinus exhibits no maxillary sinus tenderness. Left sinus exhibits frontal sinus tenderness. Left sinus exhibits no maxillary sinus tenderness.  Mouth/Throat: Oropharynx is clear and moist. No uvula swelling. No oropharyngeal exudate, posterior oropharyngeal edema or posterior oropharyngeal erythema.  No mastoiditis.  Eyes: Conjunctivae and EOM are normal. Pupils are equal, round, and reactive to light. Right eye exhibits no discharge. Left eye exhibits discharge.  Neck: Normal range of  motion.  Cardiovascular: Normal rate, regular rhythm and normal heart sounds.   No murmur heard. Pulmonary/Chest: Effort normal and breath sounds normal. No respiratory distress. She has no wheezes.  Lymphadenopathy:    She has no cervical adenopathy.  Neurological: She is alert and oriented to person, place, and time.  Skin: Skin is warm and dry. She is not diaphoretic.  Psychiatric: She has a normal mood and affect. Her behavior is normal.     Assessment and Plan: 44 year old female is here today for chief complaint of ear pain.   -Continue the Flonase daily -Continue the Claritin -Please drink plenty of water.  Eustachian tube dysfunction, right - Plan: pseudoephedrine (SUDAFED) 60 MG tablet  Trena Platt, PA-C [facility] 3/15/20166:43 PM   Spoke with patient 12/18/2014 who was requesting to take prednisone.  This was offered as a choice between sudafed, and she chose the latter.  She has not taken the sudafed.  I advised her to not take sudafed, and just the prednisone (see note).  Ordered prednisone 40 mg qd 3 days.

## 2014-12-18 ENCOUNTER — Telehealth: Payer: Self-pay

## 2014-12-18 DIAGNOSIS — H6983 Other specified disorders of Eustachian tube, bilateral: Secondary | ICD-10-CM

## 2014-12-18 MED ORDER — PREDNISONE 20 MG PO TABS: 40.0000 mg | ORAL_TABLET | Freq: Every day | ORAL | Status: DC

## 2014-12-18 NOTE — Telephone Encounter (Addendum)
PATIENT STATES SHE SAW STEPHANIE ENGLISH YESTERDAY FOR FLUID BEHIND HER EARS. STEPHANIE OFFERED TO GIVE HER PREDNISONE, HOWEVER, SHE DID NOT EXCEPT IT. NOW SHE WOULD LIKE TO HAVE THE PREDNISONE CALLED INTO HER PHARMACY. SHE WANTED TO TRY THE OVER-THE-COUNTER MEDICINE FIRST. BEST PHONE [phone] (WORK UNTIL 3:30) OR [phone](336) [phone] (CELL)  PHARMACY CHOICE IS [facility] ON [address]. MBC

## 2014-12-18 NOTE — Telephone Encounter (Signed)
Can we send in prednisone for pt?

## 2014-12-18 NOTE — Telephone Encounter (Signed)
Attempted to call work, but she was unable to get to the phone at that time.  Will contact her around 3:30pm on cell, per her instruction

## 2014-12-18 NOTE — Telephone Encounter (Signed)
Contacted patient who did not try the sudafed at this time, but would like prednisone because that is what she has done in the past.  At her last visit, I offered this and the sudafed, and she chose the later.  Advised to not use the sudafed or flonase, and prednisone for 3 days with claritin and nasal saline.

## 2014-12-29 ENCOUNTER — Ambulatory Visit (INDEPENDENT_AMBULATORY_CARE_PROVIDER_SITE_OTHER): Payer: BLUE CROSS/BLUE SHIELD

## 2014-12-29 ENCOUNTER — Ambulatory Visit (INDEPENDENT_AMBULATORY_CARE_PROVIDER_SITE_OTHER): Payer: BLUE CROSS/BLUE SHIELD | Admitting: Physician Assistant

## 2014-12-29 VITALS — BP 124/76 | HR 100 | Temp 98.2°F | Resp 18 | Ht 63.5 in | Wt 171.0 lb

## 2014-12-29 DIAGNOSIS — R509 Fever, unspecified: Secondary | ICD-10-CM | POA: Diagnosis not present

## 2014-12-29 DIAGNOSIS — K5909 Other constipation: Secondary | ICD-10-CM | POA: Diagnosis not present

## 2014-12-29 DIAGNOSIS — R350 Frequency of micturition: Secondary | ICD-10-CM | POA: Diagnosis not present

## 2014-12-29 DIAGNOSIS — R1030 Lower abdominal pain, unspecified: Secondary | ICD-10-CM | POA: Diagnosis not present

## 2014-12-29 LAB — POCT URINALYSIS DIPSTICK
Bilirubin, UA: NEGATIVE
Blood, UA: NEGATIVE
GLUCOSE UA: NEGATIVE
KETONES UA: NEGATIVE
Leukocytes, UA: NEGATIVE
NITRITE UA: NEGATIVE
PH UA: 6
PROTEIN UA: NEGATIVE
SPEC GRAV UA: 1.02
Urobilinogen, UA: 0.2

## 2014-12-29 LAB — POCT UA - MICROSCOPIC ONLY
BACTERIA, U MICROSCOPIC: NEGATIVE
CASTS, UR, LPF, POC: NEGATIVE
CRYSTALS, UR, HPF, POC: NEGATIVE
Mucus, UA: NEGATIVE
RBC, urine, microscopic: NEGATIVE
Yeast, UA: NEGATIVE

## 2014-12-29 LAB — POCT CBC
Granulocyte percent: 62.9 %G (ref 37–80)
HCT, POC: 41.5 % (ref 37.7–47.9)
Hemoglobin: 13.4 g/dL (ref 12.2–16.2)
LYMPH, POC: 1.4 (ref 0.6–3.4)
MCH, POC: 30.2 pg (ref 27–31.2)
MCHC: 32.2 g/dL (ref 31.8–35.4)
MCV: 93.8 fL (ref 80–97)
MID (cbc): 0.6 (ref 0–0.9)
MPV: 7.2 fL (ref 0–99.8)
PLATELET COUNT, POC: 249 10*3/uL (ref 142–424)
POC GRANULOCYTE: 3.5 (ref 2–6.9)
POC LYMPH PERCENT: 25.4 %L (ref 10–50)
POC MID %: 11.7 % (ref 0–12)
RBC: 4.43 M/uL (ref 4.04–5.48)
RDW, POC: 14.3 %
WBC: 5.5 10*3/uL (ref 4.6–10.2)

## 2014-12-29 MED ORDER — POLYETHYLENE GLYCOL 3350 17 GM/SCOOP PO POWD: 17.0000 g | Freq: Every day | ORAL | Status: DC

## 2014-12-29 MED ORDER — DOCUSATE SODIUM 100 MG PO CAPS: 300.0000 mg | ORAL_CAPSULE | Freq: Every day | ORAL | Status: DC

## 2014-12-29 NOTE — Progress Notes (Signed)
   Subjective:    Patient ID: Audrey Christian, female    DOB: 05-16-1971, 44 y.o.   MRN: [id number]  HPI Pt presents to clinic with about 1 week h/o feeling like she has a UTI. She has been using AZo and cranberry juice but it is not improving.  She is not having dysuria and no frequency but is having urinary urgency.  She is having lowered abd pain and lower back pain that seems to radiate towards the abd.  She has problems with constipation with her IBS and it has been worse than normal recently.  She has h/o kidney reflux and has problems with kidney infections and she feels bad today and feels like that is what it might be.  She has chronic ovarian cysts.  OTC - AZO and cranberry  Review of Systems  Constitutional: Negative for chills. Fever: Tmax 101.  Gastrointestinal: Negative for nausea.  Genitourinary: Positive for urgency and frequency. Negative for dysuria and vaginal discharge.       Objective:   Physical Exam  Constitutional: She is oriented to person, place, and time. She appears well-developed and well-nourished.  BP 124/76 mmHg  Pulse 100  Temp(Src) 98.2 F (36.8 C) (Oral)  Resp 18  Ht 5' 3.5" (1.613 m)  Wt 171 lb (77.565 kg)  BMI 29.81 kg/m2  SpO2 99%  LMP 12/16/2014   HENT:  Head: Normocephalic and atraumatic.  Right Ear: External ear normal.  Left Ear: External ear normal.  Eyes: Conjunctivae are normal.  Neck: Normal range of motion.  Cardiovascular: Normal rate and regular rhythm.   No murmur heard. Pulmonary/Chest: Effort normal and breath sounds normal. She has no wheezes.  Abdominal: Soft. There is tenderness (generalized TTP across lower abd) in the right lower quadrant, suprapubic area and left lower quadrant. There is no CVA tenderness.  Musculoskeletal:       Back:  Neurological: She is alert and oriented to person, place, and time.  Skin: Skin is warm and dry.  Psychiatric: She has a normal mood and affect. Her behavior is normal. Judgment and  thought content normal.   [facility] reading (PRIMARY) by  Dr.Guest.  Increased stool burden.      Assessment & Plan:  Urinary frequency - Plan: POCT UA - Microscopic Only, POCT urinalysis dipstick, Urine culture  Lower abdominal pain - Plan: DG Abd 1 View  Fever, unspecified fever cause - Plan: POCT CBC  Other constipation - Plan: polyethylene glycol powder (MIRALAX) powder, docusate sodium (COLACE) 100 MG capsule   Due to normal US and CBC I doubt this is related to her urinary system and with increased stool burden on xray we will treat for constipation.  Her constipation does not explain her fever but she will monitor it and if it gets worse and her pain is not relief with her constipation treatment she will RTC.  Her questions were answered and she agrees with the plan.  She will try to increase her water intake.  Benny LennertSarah Weber PA-C  [facility] 12/29/2014 9:40 AM

## 2014-12-29 NOTE — Patient Instructions (Signed)
For the next 2 days take Colace 4 pills a day - this is to soften your stool  For the miralax - use 14 capfuls in 64 ounces of water/tea - drink within 12 hours but it can be faster.  PLEASE increase your water intake this will help with your constipation.

## 2014-12-30 LAB — URINE CULTURE
COLONY COUNT: NO GROWTH
Organism ID, Bacteria: NO GROWTH

## 2015-01-17 ENCOUNTER — Other Ambulatory Visit: Payer: Self-pay

## 2015-01-17 DIAGNOSIS — H6981 Other specified disorders of Eustachian tube, right ear: Secondary | ICD-10-CM

## 2015-01-17 NOTE — Telephone Encounter (Signed)
Pharm sent req to RF Suphedrine. Judeth CornfieldStephanie, do you want to give RFs?

## 2015-01-27 NOTE — Telephone Encounter (Signed)
Pharm sent a second req for RF. Judeth CornfieldStephanie, do you want to give RFs?

## 2015-04-01 ENCOUNTER — Ambulatory Visit (INDEPENDENT_AMBULATORY_CARE_PROVIDER_SITE_OTHER): Payer: BLUE CROSS/BLUE SHIELD | Admitting: Endocrinology

## 2015-04-01 ENCOUNTER — Encounter: Payer: Self-pay | Admitting: Endocrinology

## 2015-04-01 VITALS — BP 132/78 | HR 67 | Temp 98.2°F | Ht 63.5 in | Wt 179.0 lb

## 2015-04-01 DIAGNOSIS — E042 Nontoxic multinodular goiter: Secondary | ICD-10-CM

## 2015-04-01 DIAGNOSIS — R6889 Other general symptoms and signs: Secondary | ICD-10-CM

## 2015-04-01 DIAGNOSIS — D509 Iron deficiency anemia, unspecified: Secondary | ICD-10-CM

## 2015-04-01 DIAGNOSIS — E039 Hypothyroidism, unspecified: Secondary | ICD-10-CM | POA: Diagnosis not present

## 2015-04-01 NOTE — Progress Notes (Signed)
Subjective:    Patient ID: Audrey Christian, female    DOB: 07-27-1971, 44 y.o.   MRN: [id number]  HPI Pt returns for f/u of chronic hypothyroidism (dx'ed approx 1997; most recent us in 2009 showed several small cysts).  She takes synthroid as rx'ed.  pt states slight swelling at the left anterior neck, and assoc pain.  She has had TL.  Denies menorrhagia.  Past Medical History  Diagnosis Date  . HYPOTHYROIDISM 06/03/2007  . ANXIETY 06/03/2007  . ASTHMA 06/03/2007  . Bradycardia     Asymptomatic  . Alopecia areata 05/2002  . Allergy   . Chronic kidney disease   . Anemia     Past Surgical History  Procedure Laterality Date  . Cholecystectomy  05/2002  . Tubal ligation      History   Social History  . Marital Status: Married    Spouse Name: N/A  . Number of Children: N/A  . Years of Education: N/A   Occupational History  .      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 Smoking status: Never Smoker   . Smokeless tobacco: Never Used  . Alcohol Use: No  . Drug Use: No  . Sexual Activity: Yes    Birth Control/ Protection: None   Other Topics Concern  . Not on file   Social History Narrative    Current Outpatient Prescriptions on File Prior to Visit  Medication Sig Dispense Refill  . Calcium Carbonate-Vitamin D (CALTRATE 600+D) 600-400 MG-UNIT per tablet Take 2 tablets by mouth daily.     Marland Kitchen. docusate sodium (COLACE) 100 MG capsule Take 3 capsules (300 mg total) by mouth daily. 90 capsule 11  . fluticasone (FLONASE) 50 MCG/ACT nasal spray Place into both nostrils daily.    Marland Kitchen. loratadine (CLARITIN) 10 MG tablet Take 10 mg by mouth daily.    . Multiple Vitamin (MULTIVITAMIN) tablet Take 1 tablet by mouth daily.      . Naproxen Sodium (ALEVE PO) Take by mouth.    . polyethylene glycol powder (MIRALAX) powder Take 17 g by mouth daily. 850 g 11  . SALINE NASAL SPRAY NA Place into the nose.     No current facility-administered medications on file prior to visit.     Allergies  Allergen Reactions  . Erythromycin     REACTION: Upset Stomach    Family History  Problem Relation Age of Onset  . Hypothyroidism Mother   . Hypertension Mother   . Cancer Father   . Cancer Maternal Grandmother   . Cancer Maternal Grandfather   . Cancer Paternal Grandmother   . Cancer Paternal Grandfather     BP 132/78 mmHg  Pulse 67  Temp(Src) 98.2 F (36.8 C) (Oral)  Ht 5' 3.5" (1.613 m)  Wt 179 lb (81.194 kg)  BMI 31.21 kg/m2  SpO2 97%  Review of Systems Denies weight change, BRBPR and hematuria.  She has fatigue and very mild forgetfulness    Objective:   Physical Exam VITAL SIGNS:  See vs page GENERAL: no distress NECK: There is no palpable thyroid enlargement.  No thyroid nodule is palpable.  No palpable lymphadenopathy at the anterior neck.   PSYCH: Alert and well-oriented.  Does not appear anxious nor depressed.    Lab Results  Component Value Date   TSH 2.33 04/01/2015       Assessment & Plan:  Hypothyroidism: well-replaced Small multinodular goiter: due for f/u US  Patient is advised the  following: Patient Instructions  blood tests are requested for you today.  We'll let you know about the results. Let's recheck the ultrasound.  you will receive a phone call, about a day and time for an appointment Please return in 1 year.

## 2015-04-01 NOTE — Patient Instructions (Addendum)
blood tests are requested for you today.  We'll let you know about the results. Let's recheck the ultrasound.  you will receive a phone call, about a day and time for an appointment. Please return in 1 year. 

## 2015-04-02 LAB — CBC WITH DIFFERENTIAL/PLATELET
BASOS PCT: 1.1 % (ref 0.0–3.0)
Basophils Absolute: 0.1 10*3/uL (ref 0.0–0.1)
EOS ABS: 0.3 10*3/uL (ref 0.0–0.7)
Eosinophils Relative: 4.2 % (ref 0.0–5.0)
HCT: 38.9 % (ref 36.0–46.0)
HEMOGLOBIN: 13.1 g/dL (ref 12.0–15.0)
LYMPHS PCT: 26.5 % (ref 12.0–46.0)
Lymphs Abs: 1.7 10*3/uL (ref 0.7–4.0)
MCHC: 33.7 g/dL (ref 30.0–36.0)
MCV: 91.1 fl (ref 78.0–100.0)
MONOS PCT: 14.5 % — AB (ref 3.0–12.0)
Monocytes Absolute: 0.9 10*3/uL (ref 0.1–1.0)
NEUTROS ABS: 3.4 10*3/uL (ref 1.4–7.7)
NEUTROS PCT: 53.7 % (ref 43.0–77.0)
Platelets: 256 10*3/uL (ref 150.0–400.0)
RBC: 4.27 Mil/uL (ref 3.87–5.11)
RDW: 13.7 % (ref 11.5–15.5)
WBC: 6.4 10*3/uL (ref 4.0–10.5)

## 2015-04-02 LAB — VITAMIN B12: VITAMIN B 12: 385 pg/mL (ref 211–911)

## 2015-04-02 LAB — TSH: TSH: 2.33 u[IU]/mL (ref 0.35–4.50)

## 2015-04-02 LAB — IBC PANEL
Iron: 108 ug/dL (ref 42–145)
Saturation Ratios: 25.2 % (ref 20.0–50.0)
Transferrin: 306 mg/dL (ref 212.0–360.0)

## 2015-04-02 MED ORDER — LEVOTHYROXINE SODIUM 75 MCG PO TABS: ORAL_TABLET | ORAL | Status: DC

## 2015-04-03 ENCOUNTER — Telehealth: Payer: Self-pay | Admitting: Endocrinology

## 2015-04-03 NOTE — Telephone Encounter (Signed)
Spoke with pt about the concern for her payment for her ultra sound appt at [facility] and she did understand the information I told her about her specific insurance plan.

## 2015-04-03 NOTE — Telephone Encounter (Signed)
See below

## 2015-04-03 NOTE — Telephone Encounter (Signed)
Please give pt call concerning insurance covering ultra sound and how the orders are written you may leave a message if she doesn't answer [phone]

## 2015-04-09 ENCOUNTER — Ambulatory Visit
Admission: RE | Admit: 2015-04-09 | Discharge: 2015-04-09 | Disposition: A | Discharge status: Home / Self Care | Payer: BLUE CROSS/BLUE SHIELD | Source: Ambulatory Visit | Attending: Endocrinology | Admitting: Endocrinology

## 2015-04-09 DIAGNOSIS — E042 Nontoxic multinodular goiter: Secondary | ICD-10-CM

## 2015-04-14 ENCOUNTER — Telehealth: Payer: Self-pay | Admitting: Endocrinology

## 2015-04-14 NOTE — Telephone Encounter (Signed)
Please see below [employer] Corporation[employer]egan  Thanks

## 2015-04-14 NOTE — Telephone Encounter (Signed)
Please call pt with US results.

## 2015-04-15 NOTE — Telephone Encounter (Signed)
I contacted the pt and advised her of recent thyroid US report. Please reference US report that was release to my chart on 04/10/15.

## 2015-04-18 ENCOUNTER — Ambulatory Visit (INDEPENDENT_AMBULATORY_CARE_PROVIDER_SITE_OTHER): Payer: BLUE CROSS/BLUE SHIELD | Admitting: Emergency Medicine

## 2015-04-18 VITALS — BP 110/72 | HR 76 | Temp 98.5°F | Resp 18 | Ht 65.0 in | Wt 177.0 lb

## 2015-04-18 DIAGNOSIS — S46912A Strain of unspecified muscle, fascia and tendon at shoulder and upper arm level, left arm, initial encounter: Secondary | ICD-10-CM | POA: Diagnosis not present

## 2015-04-18 MED ORDER — TRAMADOL HCL 50 MG PO TABS: 50.0000 mg | ORAL_TABLET | Freq: Three times a day (TID) | ORAL | Status: DC | PRN

## 2015-04-18 MED ORDER — CYCLOBENZAPRINE HCL 5 MG PO TABS: 5.0000 mg | ORAL_TABLET | Freq: Three times a day (TID) | ORAL | Status: DC | PRN

## 2015-04-18 NOTE — Patient Instructions (Signed)

## 2015-04-18 NOTE — Progress Notes (Signed)
Subjective:  Patient ID: Audrey Christian, female    DOB: September 16, 1971  Age: 44 y.o. MRN: [id number]  CC: Shoulder Pain and Neck Pain   HPI Audrey Christian presents  she has one-week history of pain in her left posterior shoulder into her neck. She has no radiation of pain numbness tingling or weakness. No history of injury. No history of overuse. 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 She's had no improvement with over-the-counter medication. She has no history of direct injury or prior history of injury. Shoulder left  History Audrey Christian has a past medical history of HYPOTHYROIDISM (06/03/2007); ANXIETY (06/03/2007); ASTHMA (06/03/2007); Bradycardia; Alopecia areata (05/2002); Allergy; Chronic kidney disease; and Anemia.   She has past surgical history that includes Cholecystectomy (05/2002) and Tubal ligation.   Her  family history includes Cancer in her father, maternal grandfather, maternal grandmother, paternal grandfather, and paternal grandmother; Hypertension in her mother; Hypothyroidism in her mother.  She   reports that she has never smoked. She has never used smokeless tobacco. She reports that she does not drink alcohol or use illicit drugs.  Outpatient Prescriptions Prior to Visit  Medication Sig Dispense Refill  . Calcium Carbonate-Vitamin D (CALTRATE 600+D) 600-400 MG-UNIT per tablet Take 2 tablets by mouth daily.     Marland Kitchen docusate sodium (COLACE) 100 MG capsule Take 3 capsules (300 mg total) by mouth daily. 90 capsule 11  . fluticasone (FLONASE) 50 MCG/ACT nasal spray Place into both nostrils daily.    Marland Kitchen levothyroxine (SYNTHROID, LEVOTHROID) 75 MCG tablet Take 1 tablet by mouth once daily before breakfast. 90 tablet 3  . Multiple Vitamin (MULTIVITAMIN) tablet Take 1 tablet by mouth daily.      . polyethylene glycol powder (MIRALAX) powder Take 17 g by mouth daily. 850 g 11  . loratadine (CLARITIN) 10 MG tablet Take 10 mg by mouth daily.    . Naproxen Sodium (ALEVE PO)  Take by mouth.    Marland Kitchen SALINE NASAL SPRAY NA Place into the nose.     No facility-administered medications prior to visit.    History   Social History  . Marital Status: Married    Spouse Name: N/A  . Number of Children: N/A  . Years of Education: N/A   Occupational History  .      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 Smoking status: Never Smoker   . Smokeless tobacco: Never Used  . Alcohol Use: No  . Drug Use: No  . Sexual Activity: Yes    Birth Control/ Protection: None   Other Topics Concern  . None   Social History Narrative     Review of Systems  Constitutional: Negative for fever, chills and appetite change.  HENT: Negative for congestion, ear pain, postnasal drip, sinus pressure and sore throat.   Eyes: Negative for pain and redness.  Respiratory: Negative for cough, shortness of breath and wheezing.   Cardiovascular: Negative for leg swelling.  Gastrointestinal: Negative for nausea, vomiting, abdominal pain, diarrhea, constipation and blood in stool.  Endocrine: Negative for polyuria.  Genitourinary: Negative for dysuria, urgency, frequency and flank pain.  Musculoskeletal: Negative for gait problem.  Skin: Negative for rash.  Neurological: Negative for weakness and headaches.  Psychiatric/Behavioral: Negative for confusion and decreased concentration. The patient is not nervous/anxious.     Objective:  BP 110/72 mmHg  Pulse 76  Temp(Src) 98.5 F (36.9 C) (Oral)  Resp 18  Ht  (1.651 m)  Wt 177 lb (80.287 kg)  BMI 29.45 kg/m2  SpO2 98%  LMP 04/14/2015  Physical Exam  Constitutional: She is oriented to person, place, and time. She appears well-developed and well-nourished.  HENT:  Head: Normocephalic and atraumatic.  Eyes: Conjunctivae are normal. Pupils are equal, round, and reactive to light.  Pulmonary/Chest: Effort normal.  Musculoskeletal: She exhibits no edema.  Neurological: She is alert and oriented to person, place, and  time.  Skin: Skin is dry.  Psychiatric: She has a normal mood and affect. Her behavior is normal. Thought content normal.   She has tenderness posterior left shoulder. With no trigger point. She has no impingement of her full range of motion. She has normal motor strength.   Assessment & Plan:   Audrey Christian was seen today for shoulder pain and neck pain.  Diagnoses and all orders for this visit:  Shoulder strain, left, initial encounter  Other orders -     cyclobenzaprine (FLEXERIL) 5 MG tablet; Take 1 tablet (5 mg total) by mouth 3 (three) times daily as needed for muscle spasms. -     traMADol (ULTRAM) 50 MG tablet; Take 1 tablet (50 mg total) by mouth every 8 (eight) hours as needed.  I have discontinued Audrey Christian's Naproxen Sodium (ALEVE PO), SALINE NASAL SPRAY NA, and loratadine. I am also having her start on cyclobenzaprine and traMADol. Additionally, I am having her maintain her Calcium Carbonate-Vitamin D, multivitamin, fluticasone, polyethylene glycol powder, docusate sodium, levothyroxine, predniSONE, and ibuprofen.  Meds ordered this encounter  Medications  . predniSONE (DELTASONE) 20 MG tablet    Sig: Take 40 mg by mouth daily with breakfast.  . ibuprofen (ADVIL,MOTRIN) 800 MG tablet    Sig: Take 800 mg by mouth every 8 (eight) hours as needed.  . cyclobenzaprine (FLEXERIL) 5 MG tablet    Sig: Take 1 tablet (5 mg total) by mouth 3 (three) times daily as needed for muscle spasms.    Dispense:  30 tablet    Refill:  1  . traMADol (ULTRAM) 50 MG tablet    Sig: Take 1 tablet (50 mg total) by mouth every 8 (eight) hours as needed.    Dispense:  30 tablet    Refill:  0    Appropriate red flag conditions were discussed with the patient as well as actions that should be taken.  Patient expressed his understanding.  Follow-up: Return if symptoms worsen or fail to improve.  Carmelina DaneAnderson, Lucielle Vokes S, MD

## 2015-06-12 ENCOUNTER — Encounter: Payer: Self-pay | Admitting: Physician Assistant

## 2015-06-12 DIAGNOSIS — N840 Polyp of corpus uteri: Secondary | ICD-10-CM | POA: Insufficient documentation

## 2015-06-14 MED ORDER — PSEUDOEPHEDRINE HCL 60 MG PO TABS: 60.0000 mg | ORAL_TABLET | Freq: Four times a day (QID) | ORAL | Status: DC | PRN

## 2015-10-02 ENCOUNTER — Ambulatory Visit (INDEPENDENT_AMBULATORY_CARE_PROVIDER_SITE_OTHER): Payer: BLUE CROSS/BLUE SHIELD | Admitting: Physician Assistant

## 2015-10-02 VITALS — BP 122/80 | HR 68 | Temp 98.0°F | Resp 16 | Ht 64.0 in | Wt 175.0 lb

## 2015-10-02 DIAGNOSIS — J0100 Acute maxillary sinusitis, unspecified: Secondary | ICD-10-CM

## 2015-10-02 MED ORDER — AMOXICILLIN-POT CLAVULANATE 875-125 MG PO TABS: 1.0000 | ORAL_TABLET | Freq: Two times a day (BID) | ORAL | Status: DC

## 2015-10-02 NOTE — Progress Notes (Signed)
10/02/2015 3:45 PM   DOB: 01/28/1971 / MRN: [id number]  SUBJECTIVE:  Audrey Christian is a 44 y.o. female presenting for for the evaluation of congestion that started 2 weeks ago.  Associated symptoms include headache, facial pain and subjective fever today, and she denies cough, sore throat, difficulty breathing and jaw pain. Treatments tried thus far include flonase, zyrtec and ibuprofen with fair  relief. She reports sick contacts.  She is allergic to erythromycin.   She  has a past medical history of HYPOTHYROIDISM (06/03/2007); ANXIETY (06/03/2007); ASTHMA (06/03/2007); Bradycardia; Alopecia areata (05/2002); Allergy; Chronic kidney disease; and Anemia.    She  reports that she has never smoked. She has never used smokeless tobacco. She reports that she does not drink alcohol or use illicit drugs. She  reports that she currently engages in sexual activity. She reports using the following method of birth control/protection: None. The patient  has past surgical history that includes Cholecystectomy (05/2002) and Tubal ligation.  Her family history includes Cancer in her father, maternal grandfather, maternal grandmother, paternal grandfather, and paternal grandmother; Hypertension in her mother; Hypothyroidism in her mother.  Review of Systems  Constitutional: Positive for malaise/fatigue. Negative for fever, chills and diaphoresis.  HENT: Positive for congestion. Negative for sore throat.   Respiratory: Negative for cough, hemoptysis, shortness of breath and wheezing.   Cardiovascular: Negative for chest pain.  Gastrointestinal: Negative for nausea.  Skin: Negative for rash.  Neurological: Negative for dizziness and weakness.  Endo/Heme/Allergies: Negative for polydipsia.    Problem list and medications reviewed and updated by myself where necessary, and exist elsewhere in the encounter.   OBJECTIVE:  BP 122/80 mmHg  Pulse 68  Temp(Src) 98 F (36.7 C) (Oral)  Resp 16  Ht   (1.626 m)  Wt 175 lb (79.379 kg)  BMI 30.02 kg/m2  SpO2 96%  LMP 09/27/2015 (Exact Date)  Physical Exam  Constitutional: She is oriented to person, place, and time. She appears well-nourished. No distress.  HENT:  Nose: Nose normal.  Mouth/Throat: Uvula is midline, oropharynx is clear and moist and mucous membranes are normal.  Eyes: EOM are normal. Pupils are equal, round, and reactive to light.  Cardiovascular: Normal rate.   Pulmonary/Chest: Effort normal.  Abdominal: She exhibits no distension.  Lymphadenopathy:       Head (right side): No submandibular and no tonsillar adenopathy present.       Head (left side): No submandibular and no tonsillar adenopathy present.    She has cervical adenopathy.       Right cervical: Posterior cervical adenopathy present. No superficial cervical and no deep cervical adenopathy present.      Left cervical: No superficial cervical, no deep cervical and no posterior cervical adenopathy present.  Neurological: She is alert and oriented to person, place, and time. No cranial nerve deficit. Gait normal.  Skin: Skin is dry. She is not diaphoretic.  Psychiatric: She has a normal mood and affect.  Vitals reviewed.   No results found for this or any previous visit (from the past 48 hour(s)).  ASSESSMENT AND PLAN:  Angelique was seen today for sinus problem and cough.  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  Will treat.  Symptomatic therapies provided via AVS.  -     amoxicillin-clavulanate (AUGMENTIN) 875-125 MG  tablet; Take 1 tablet by mouth 2 (two) times daily. -     Care order/instruction    The patient was advised to call or return to clinic if she does not see an improvement in symptoms or to seek the care of the closest emergency department if she worsens  with the above plan.   Deliah BostonMichael Renold Kozar, MHS, PA-C [facility] 10/02/2015 3:45 PM

## 2015-10-02 NOTE — Patient Instructions (Signed)
-   You have a viral upper respiratory infection, (the common cold) and it is not uncommon for symptoms to last 10 days to 2 weeks, regardless of which medications you take. Unfortunately antibiotics will not help your symptoms as they target bacteria, and you are likely suffering from a virus. Additionally, 1 in 8 people who take antibiotics suffer mild to severe side effects.    - You would benefit from high dose ibuprofen. TAKE 600-800 mg of ibuprofen every 8 hours as needed to control low grade fever, fatigue, and pain.    - I also recommend that you take Zyrtec-D 5-120 every morning or Claritin-D 10-240 for the next five to 10 days. This medication will help you with nasal congestion, post nasal drip and sneezing. You will have to purchase this directly from the pharmacist   Viral URI Medications: Please consult the pharmacist if you have questions. 600-800 mg ibuprofen every 8 hours as needed for the next five to ten days 5-120 Zyrtec-D every morning for five to 10 days OR Claritin D 10-240 every morning for five days.  Please visit the CDC's website https://www.cdc.gov/getsmart/ to learn about appropriate and inappropriate uses of antibiotics.    

## 2015-10-02 NOTE — Progress Notes (Signed)
  Medical screening examination/treatment/procedure(s) were performed by non-physician practitioner and as supervising physician I was immediately available for consultation/collaboration.     

## 2015-10-05 HISTORY — PX: OTHER SURGICAL HISTORY: SHX169

## 2015-11-05 ENCOUNTER — Ambulatory Visit (INDEPENDENT_AMBULATORY_CARE_PROVIDER_SITE_OTHER): Payer: BLUE CROSS/BLUE SHIELD | Admitting: Emergency Medicine

## 2015-11-05 VITALS — BP 130/76 | HR 74 | Temp 98.9°F | Resp 16 | Ht 64.5 in | Wt 173.6 lb

## 2015-11-05 DIAGNOSIS — N10 Acute tubulo-interstitial nephritis: Secondary | ICD-10-CM

## 2015-11-05 DIAGNOSIS — R350 Frequency of micturition: Secondary | ICD-10-CM | POA: Diagnosis not present

## 2015-11-05 DIAGNOSIS — K5909 Other constipation: Secondary | ICD-10-CM

## 2015-11-05 DIAGNOSIS — R1030 Lower abdominal pain, unspecified: Secondary | ICD-10-CM | POA: Diagnosis not present

## 2015-11-05 DIAGNOSIS — R509 Fever, unspecified: Secondary | ICD-10-CM

## 2015-11-05 LAB — POCT URINALYSIS DIP (MANUAL ENTRY)
BILIRUBIN UA: NEGATIVE
BILIRUBIN UA: NEGATIVE
Glucose, UA: NEGATIVE
NITRITE UA: NEGATIVE
PH UA: 7
Protein Ur, POC: 30 — AB
Spec Grav, UA: 1.015
Urobilinogen, UA: 0.2

## 2015-11-05 LAB — POCT CBC
GRANULOCYTE PERCENT: 75 % (ref 37–80)
HEMATOCRIT: 37.9 % (ref 37.7–47.9)
Hemoglobin: 13.3 g/dL (ref 12.2–16.2)
Lymph, poc: 1.4 (ref 0.6–3.4)
MCH: 30.8 pg (ref 27–31.2)
MCHC: 35 g/dL (ref 31.8–35.4)
MCV: 88 fL (ref 80–97)
MID (cbc): 0.8 (ref 0–0.9)
MPV: 7.6 fL (ref 0–99.8)
PLATELET COUNT, POC: 245 10*3/uL (ref 142–424)
POC GRANULOCYTE: 6.5 (ref 2–6.9)
POC LYMPH %: 15.6 % (ref 10–50)
POC MID %: 9.4 %M (ref 0–12)
RBC: 4.3 M/uL (ref 4.04–5.48)
RDW, POC: 13.4 %
WBC: 8.7 10*3/uL (ref 4.6–10.2)

## 2015-11-05 LAB — POCT URINE PREGNANCY: Preg Test, Ur: NEGATIVE

## 2015-11-05 LAB — POC MICROSCOPIC URINALYSIS (UMFC): Mucus: ABSENT

## 2015-11-05 MED ORDER — POLYETHYLENE GLYCOL 3350 17 GM/SCOOP PO POWD: 17.0000 g | Freq: Every day | ORAL | Status: DC

## 2015-11-05 MED ORDER — CEFTRIAXONE SODIUM 1 G IJ SOLR
1.0000 g | Freq: Once | INTRAMUSCULAR | Status: AC
  Administered 2015-11-05: 1 g via INTRAMUSCULAR

## 2015-11-05 MED ORDER — DOCUSATE SODIUM 100 MG PO CAPS: 300.0000 mg | ORAL_CAPSULE | Freq: Every day | ORAL | Status: DC

## 2015-11-05 MED ORDER — CIPROFLOXACIN HCL 500 MG PO TABS: 500.0000 mg | ORAL_TABLET | Freq: Two times a day (BID) | ORAL | Status: DC

## 2015-11-05 NOTE — Patient Instructions (Signed)

## 2015-11-05 NOTE — Progress Notes (Addendum)
Patient ID: Audrey Christian, female   DOB: [birth year], 45 y.o.   MRN: [id number]    By signing my name below, I, Essence Howell, attest that this documentation has been prepared under the direction and in the presence of Collene Gobble, MD Electronically Signed: Charline Bills, ED Scribe 11/05/2015 at 8:23 AM.  Chief Complaint:  Chief Complaint  Patient presents with  . Fever    Lowgrade, unspecified per pt  . Dysuria    Describes as pressure  . Urinary Frequency  . Abdominal Pain  . Back Pain  . Medication Refill    Miralax, stool softener   HPI: Audrey Christian is a 45 y.o. female, with a h/o chronic kidney disease, who reports to [facility] today complaining of gradually worsening urinary frequency for the past few days. Pt states that she has an urologist that she does not see regularly for recurrent UTIs since she has a hard time getting an appointment with him. She reports associated mild fever, malodorous urine, dysuria that she describes as pressure, abdominal pain, back pain.  No treatments tried PTA. Pt denies chills, vaginal discharge. Pt reports normal menstrual periods. She has had a tubal ligation.  Past Medical History  Diagnosis Date  . HYPOTHYROIDISM 06/03/2007  . ANXIETY 06/03/2007  . ASTHMA 06/03/2007  . Bradycardia     Asymptomatic  . Alopecia areata 05/2002  . Allergy   . Chronic kidney disease   . Anemia    Past Surgical History  Procedure Laterality Date  . Cholecystectomy  05/2002  . Tubal ligation     Social History   Social History  . Marital Status: Married    Spouse Name: N/A  . Number of Children: N/A  . Years of Education: N/A   Occupational History  .      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 Smoking status: Never Smoker   . Smokeless tobacco: Never Used  . Alcohol Use: No  . Drug Use: No  . Sexual Activity: Yes    Birth Control/ Protection: None   Other Topics Concern  . None   Social History Narrative   Family History    Problem Relation Age of Onset  . Hypothyroidism Mother   . Hypertension Mother   . Cancer Father   . Cancer Maternal Grandmother   . Cancer Maternal Grandfather   . Cancer Paternal Grandmother   . Cancer Paternal Grandfather    Allergies  Allergen Reactions  . Erythromycin     REACTION: Upset Stomach   Prior to Admission medications   Medication Sig Start Date End Date Taking? Authorizing Provider  Calcium Carbonate-Vitamin D (CALTRATE 600+D) 600-400 MG-UNIT per tablet Take 2 tablets by mouth daily.    Yes Historical Provider, MD  docusate sodium (COLACE) 100 MG capsule Take 3 capsules (300 mg total) by mouth daily. 12/29/14  Yes Morrell Riddle, PA-C  fluticasone (FLONASE) 50 MCG/ACT nasal spray Place into both nostrils daily.   Yes Historical Provider, MD  ibuprofen (ADVIL,MOTRIN) 800 MG tablet Take 800 mg by mouth every 8 (eight) hours as needed.   Yes Historical Provider, MD  levothyroxine (SYNTHROID, LEVOTHROID) 75 MCG tablet Take 1 tablet by mouth once daily before breakfast. 04/02/15  Yes Romero Belling, MD  minoxidil (ROGAINE) 2 % external solution Apply topically 2 (two) times daily.   Yes Historical Provider, MD  Multiple Vitamin (MULTIVITAMIN) tablet Take 1 tablet by mouth daily.     Yes Historical Provider,  MD  polyethylene glycol powder (MIRALAX) powder Take 17 g by mouth daily. 12/29/14  Yes Morrell Riddle, PA-C  amoxicillin-clavulanate (AUGMENTIN) 875-125 MG tablet Take 1 tablet by mouth 2 (two) times daily. Patient not taking: Reported on 11/05/2015 10/02/15   Ofilia Neas, PA-C   ROS: The patient denies -chills, night sweats, unintentional weight loss, chest pain, palpitations, wheezing, dyspnea on exertion, nausea, vomiting, -vaginal discharge, hematuria, melena, numbness, weakness, or tingling. +fever, +dysuria, +urinary frequency, +back pain, +abdominal pain   All other systems have been reviewed and were otherwise negative with the exception of those mentioned in the HPI  and as above.    PHYSICAL EXAM: Filed Vitals:   11/05/15 0809  BP: 130/76  Pulse: 74  Temp: 98.9 F (37.2 C)  Resp: 16   Body mass index is 29.35 kg/(m^2).  General: Alert, no acute distress HEENT:  Normocephalic, atraumatic, oropharynx patent. Eye: Nonie Hoyer [facility] Cardiovascular:  Regular rate and rhythm, no rubs murmurs or gallops.  No Carotid bruits, radial pulse intact. No pedal edema.  Respiratory: Clear to auscultation bilaterally.  No wheezes, rales, or rhonchi.  No cyanosis, no use of accessory musculature Abdominal: No organomegaly, abdomen is soft. No masses. Normal bowel sounds. No L flank tenderness. Mild suprapubic and LLQ abdominal tenderness.  Musculoskeletal: Gait intact. No edema, tenderness Skin: No rashes. Neurologic: Facial musculature symmetric. Psychiatric: Patient acts appropriately throughout our interaction. Lymphatic: No cervical or submandibular lymphadenopathy  LABS: Results for orders placed or performed in visit on 11/05/15  POCT Microscopic Urinalysis ([facility])  Result Value Ref Range   WBC,UR,HPF,POC Too numerous to count  (A) None WBC/hpf   RBC,UR,HPF,POC Too numerous to count  (A) None RBC/hpf   Bacteria Few (A) None, Too numerous to count   Mucus Absent Absent   Epithelial Cells, UR Per Microscopy Few (A) None, Too numerous to count cells/hpf  POCT urinalysis dipstick  Result Value Ref Range   Color, UA yellow yellow   Clarity, UA cloudy (A) clear   Glucose, UA negative negative   Bilirubin, UA negative negative   Ketones, POC UA negative negative   Spec Grav, UA 1.015    Blood, UA moderate (A) negative   pH, UA 7.0    Protein Ur, POC =30 (A) negative   Urobilinogen, UA 0.2    Nitrite, UA Negative Negative   Leukocytes, UA small (1+) (A) Negative  POCT CBC  Result Value Ref Range   WBC 8.7 4.6 - 10.2 K/uL   Lymph, poc 1.4 0.6 - 3.4   POC LYMPH PERCENT 15.6 10 - 50 %L   MID (cbc) 0.8 0 - 0.9   POC MID % 9.4 0 - 12 %M   POC  Granulocyte 6.5 2 - 6.9   Granulocyte percent 75.0 37 - 80 %G   RBC 4.30 4.04 - 5.48 M/uL   Hemoglobin 13.3 12.2 - 16.2 g/dL   HCT, POC 38.7 56.4 - 47.9 %   MCV 88.0 80 - 97 fL   MCH, POC 30.8 27 - 31.2 pg   MCHC 35.0 31.8 - 35.4 g/dL   RDW, POC 33.2 %   Platelet Count, POC 245 142 - 424 K/uL   MPV 7.6 0 - 99.8 fL  POCT urine pregnancy  Result Value Ref Range   Preg Test, Ur Negative Negative   EKG/XRAY:   Primary read interpreted by Dr. Cleta Alberts at [facility].  ASSESSMENT/PLAN: Patient will be given 1 g of Rocephin. Will treat with Cipro 500 twice  a day for 7 days. Repeat urine in 10 days. Referral made to Dr. Retta Diones.. Recheck in 48 hours if not significantly better.I personally performed the services described in this documentation, which was scribed in my presence. The recorded information has been reviewed and is accurate.   Gross sideeffects, risk and benefits, and alternatives of medications d/w patient. Patient is aware that all medications have potential sideeffects and we are unable to predict every sideeffect or drug-drug interaction that may occur.  Lesle Chris MD 11/05/2015 8:13 AM

## 2015-11-07 LAB — URINE CULTURE

## 2015-11-09 ENCOUNTER — Telehealth: Payer: Self-pay

## 2015-11-09 NOTE — Telephone Encounter (Signed)
Patient would like a prescription called in for some yeast infection medication please.

## 2015-11-11 ENCOUNTER — Other Ambulatory Visit: Payer: Self-pay | Admitting: Emergency Medicine

## 2015-11-11 MED ORDER — FLUCONAZOLE 150 MG PO TABS
150.0000 mg | ORAL_TABLET | Freq: Once | ORAL | Status: DC
Start: 2015-11-11 — End: 2016-04-09

## 2015-11-11 NOTE — Telephone Encounter (Signed)
Call patient and let her know I sent in a prescription for Diflucan.

## 2015-11-11 NOTE — Telephone Encounter (Signed)
Spoke with patient advised prescription sent in

## 2015-11-16 IMAGING — CR DG ABDOMEN 1V
1 series · 1 of 1 positions shown · non-contrast
Comparison: CT abdomen and pelvis July 14, 2011

CLINICAL DATA: Abdominal pain and urinary urgency for 1 week.
Constipation

EXAM:
ABDOMEN - 1 VIEW

[AP]
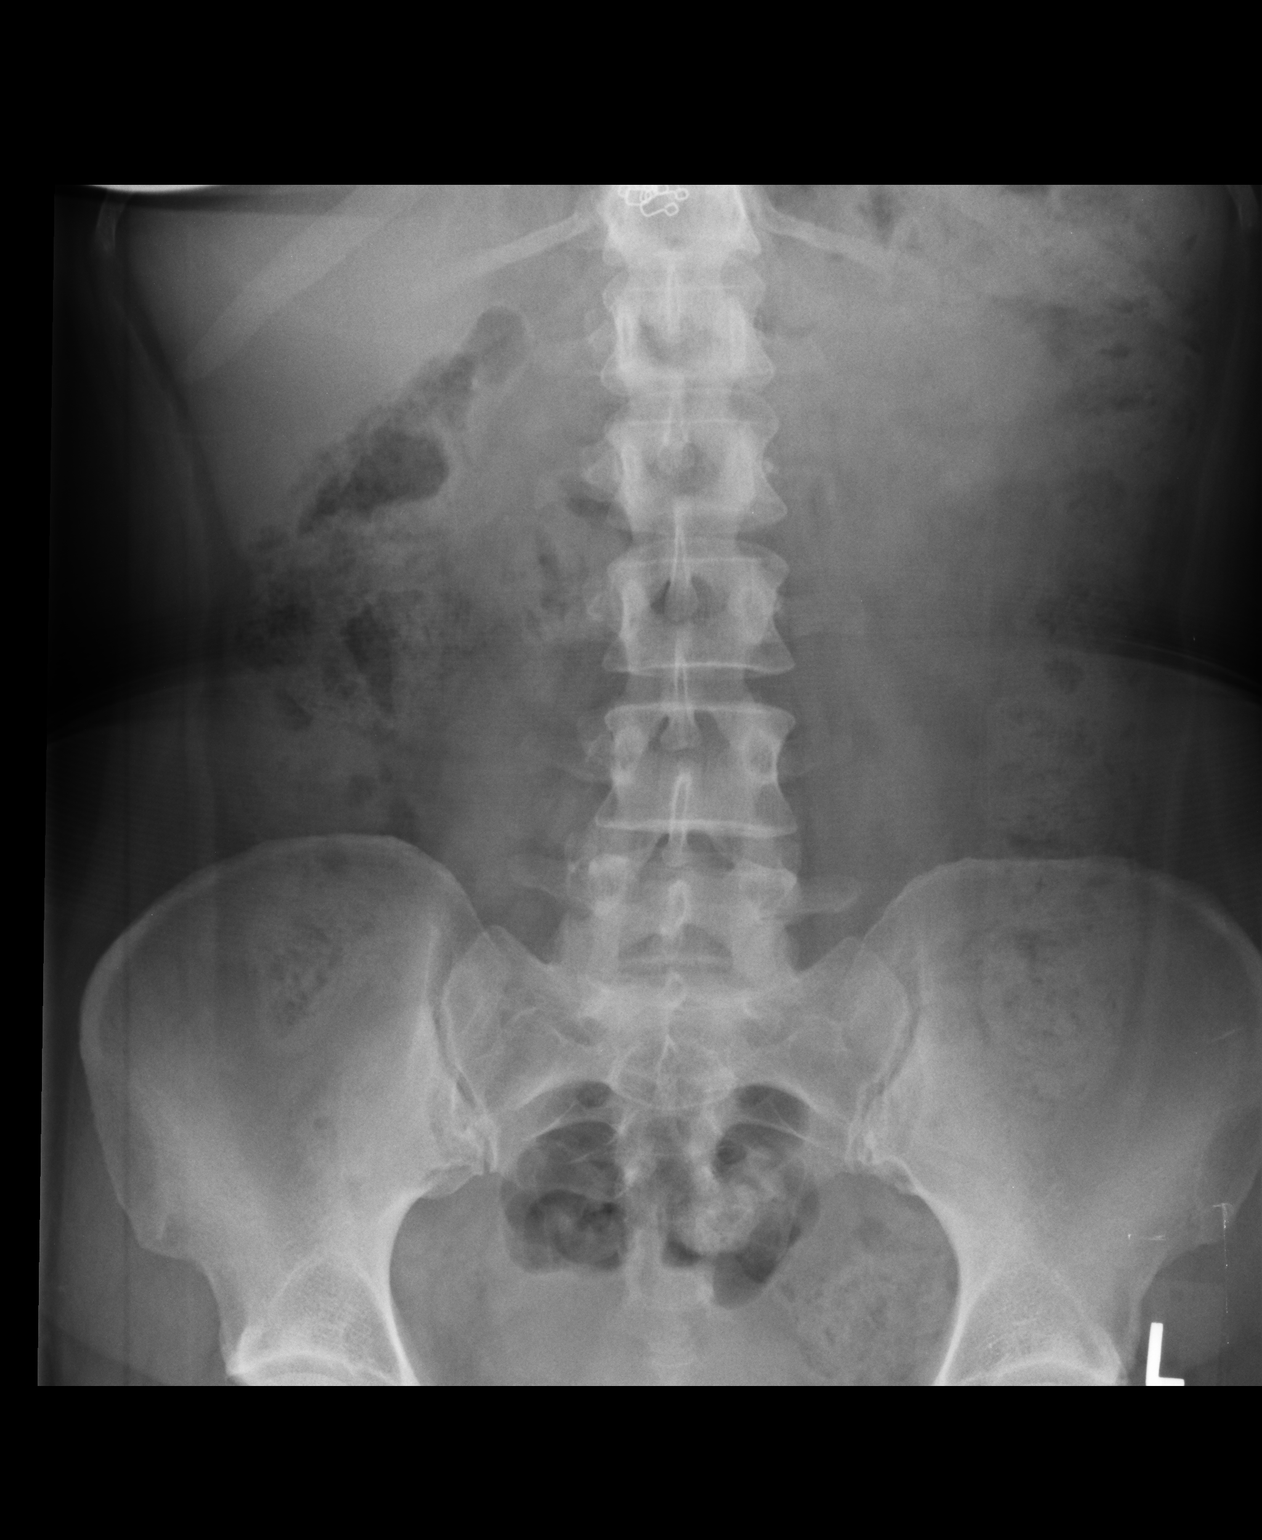

[1 of 1 positions shown; findings below may reference images not displayed]

FINDINGS: There is diffuse stool throughout the colon. There is no bowel
dilatation or air-fluid level suggesting obstruction. No free air.
There is a phlebolith in the mid left pelvis. There are surgical
clips in the right upper quadrant region.
IMPRESSION: Diffuse stool throughout colon. No obstruction or free air is seen
on this supine examination.

## 2015-11-27 ENCOUNTER — Ambulatory Visit (INDEPENDENT_AMBULATORY_CARE_PROVIDER_SITE_OTHER): Payer: BLUE CROSS/BLUE SHIELD | Admitting: Family Medicine

## 2015-11-27 VITALS — BP 104/74 | HR 90 | Temp 99.2°F | Resp 16 | Ht 64.5 in | Wt 174.8 lb

## 2015-11-27 DIAGNOSIS — Z20828 Contact with and (suspected) exposure to other viral communicable diseases: Secondary | ICD-10-CM

## 2015-11-27 DIAGNOSIS — R6889 Other general symptoms and signs: Secondary | ICD-10-CM | POA: Diagnosis not present

## 2015-11-27 LAB — POCT INFLUENZA A/B
Influenza A, POC: NEGATIVE
Influenza B, POC: NEGATIVE

## 2015-11-27 MED ORDER — BENZONATATE 100 MG PO CAPS: 100.0000 mg | ORAL_CAPSULE | Freq: Two times a day (BID) | ORAL | Status: DC | PRN

## 2015-11-27 MED ORDER — OSELTAMIVIR PHOSPHATE 75 MG PO CAPS: 75.0000 mg | ORAL_CAPSULE | Freq: Two times a day (BID) | ORAL | Status: DC

## 2015-11-27 MED ORDER — HYDROCODONE-HOMATROPINE 5-1.5 MG/5ML PO SYRP: 5.0000 mL | ORAL_SOLUTION | Freq: Every evening | ORAL | Status: DC | PRN

## 2015-11-27 NOTE — Patient Instructions (Signed)
Influenza, Adult Influenza (flu) is an infection in the mouth, nose, and throat (respiratory tract) caused by a virus. The flu can make you feel very ill. Influenza spreads easily from person to person (contagious).  HOME CARE   Only take medicines as told by your doctor.  Use a cool mist humidifier to make breathing easier.  Get plenty of rest until your fever goes away. This usually takes 3 to 4 days.  Drink enough fluids to keep your pee (urine) clear or pale yellow.  Cover your mouth and nose when you cough or sneeze.  Wash your hands well to avoid spreading the flu.  Stay home from work or school until your fever has been gone for at least 1 full day.  Get a flu shot every year. GET HELP RIGHT AWAY IF:   You have trouble breathing or feel short of breath.  Your skin or nails turn blue.  You have severe neck pain or stiffness.  You have a severe headache, facial pain, or earache.  Your fever gets worse or keeps coming back.  You feel sick to your stomach (nauseous), throw up (vomit), or have watery poop (diarrhea).  You have chest pain.  You have a deep cough that gets worse, or you cough up more thick spit (mucus). MAKE SURE YOU:   Understand these instructions.  Will watch your condition.  Will get help right away if you are not doing well or get worse.   This information is not intended to replace advice given to you by your health care provider. Make sure you discuss any questions you have with your health care provider.   Document Released: 06/29/2008 Document Revised: 10/11/2014 Document Reviewed: 12/20/2011 Elsevier Interactive Patient Education 2016 [employer] Inc.  

## 2015-11-27 NOTE — Progress Notes (Signed)
 Chief Complaint:  Chief Complaint  Patient presents with  . Headache    x tuesday  . Generalized Body Aches    x tueday  . Fever    x yesterday    HPI: Audrey Christian is a 45 y.o. female who reports to [facility] today complaining of cough, fevers T max 100.2, flu contact both husband and daughter.  She has taken ibuprofen. + chills, some OSB with cough, non productive cough. No n/v/abd apain, rashes or diarrhea.   Past Medical History  Diagnosis Date  . HYPOTHYROIDISM 06/03/2007  . ANXIETY 06/03/2007  . ASTHMA 06/03/2007  . Bradycardia     Asymptomatic  . Alopecia areata 05/2002  . Allergy   . Chronic kidney disease   . Anemia    Past Surgical History  Procedure Laterality Date  . Cholecystectomy  05/2002  . Tubal ligation     Social History   Social History  . Marital Status: Married    Spouse Name: N/A  . Number of Children: N/A  . Years of Education: N/A   Occupational History  .      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 Smoking status: Never Smoker   . Smokeless tobacco: Never Used  . Alcohol Use: No  . Drug Use: No  . Sexual Activity: Yes    Birth Control/ Protection: None   Other Topics Concern  . None   Social History Narrative   Family History  Problem Relation Age of Onset  . Hypothyroidism Mother   . Hypertension Mother   . Cancer Father   . Cancer Maternal Grandmother   . Cancer Maternal Grandfather   . Cancer Paternal Grandmother   . Cancer Paternal Grandfather    Allergies  Allergen Reactions  . Erythromycin     REACTION: Upset Stomach   Prior to Admission medications   Medication Sig Start Date End Date Taking? Authorizing Provider  Calcium Carbonate-Vitamin D (CALTRATE 600+D) 600-400 MG-UNIT per tablet Take 2 tablets by mouth daily.    Yes Historical Provider, MD  docusate sodium (COLACE) 100 MG capsule Take 3 capsules (300 mg total) by mouth daily. 11/05/15  Yes Collene Gobble, MD  fluconazole (DIFLUCAN) 150 MG  tablet Take 1 tablet (150 mg total) by mouth once. Repeat if needed 11/11/15  Yes Collene Gobble, MD  fluticasone [facility]) 50 MCG/ACT nasal spray Place into both nostrils daily.   Yes Historical Provider, MD  ibuprofen (ADVIL,MOTRIN) 800 MG tablet Take 800 mg by mouth every 8 (eight) hours as needed.   Yes Historical Provider, MD  levothyroxine (SYNTHROID, LEVOTHROID) 75 MCG tablet Take 1 tablet by mouth once daily before breakfast. 04/02/15  Yes Romero Belling, MD  minoxidil (ROGAINE) 2 % external solution Apply topically 2 (two) times daily.   Yes Historical Provider, MD  Multiple Vitamin (MULTIVITAMIN) tablet Take 1 tablet by mouth daily.     Yes Historical Provider, MD  polyethylene glycol powder (MIRALAX) powder Take 17 g by mouth daily. 11/05/15  Yes Collene Gobble, MD     ROS: The patient denies fevers, chills, night sweats, unintentional weight loss, chest pain, palpitations, wheezing, dyspnea on exertion, nausea, vomiting, abdominal pain, dysuria, hematuria, melena, numbness, weakness, or tingling.   All other systems have been reviewed and were otherwise negative with the exception of those mentioned in the HPI and as above.    PHYSICAL EXAM: Filed Vitals:   11/27/15 0836  BP: 104/74  Pulse: 90  Temp: 99.2 F (37.3 C)  Resp: 16   Body mass index is 29.55 kg/(m^2).   General: Alert, no acute distress HEENT:  Normocephalic, atraumatic, oropharynx patent. EOMI, PERRLA Erythematous throat, no exudates, TM normal, neg sinus tenderness, + erythematous/boggy nasal mucosa Cardiovascular:  Regular rate and rhythm, no rubs murmurs or gallops.  No Carotid bruits, radial pulse intact. No pedal edema.  Respiratory: Clear to auscultation bilaterally.  No wheezes, rales, or rhonchi.  No cyanosis, no use of accessory musculature Abdominal: No organomegaly, abdomen is soft and non-tender, positive bowel sounds. No masses. Skin: No rashes. Neurologic: Facial musculature symmetric. Psychiatric:  Patient acts appropriately throughout our interaction. Lymphatic: No cervical or submandibular lymphadenopathy Musculoskeletal: Gait intact. No edema, tenderness   LABS: Results for orders placed or performed in visit on 11/27/15  POCT Influenza A/B  Result Value Ref Range   Influenza A, POC Negative Negative   Influenza B, POC Negative Negative     EKG/XRAY:   Primary read interpreted by Dr. Conley Rolls at [facility].   ASSESSMENT/PLAN: Encounter Diagnoses  Name Primary?  . Flu-like symptoms Yes  . Exposure to the flu    Rx tamiflu since exposure and sxs Rx tessalon perles, hycodan IF she feel worse and has Lower respiratory sxs, then will consider abx Fu prn   Gross sideeffects, risk and benefits, and alternatives of medications d/w patient. Patient is aware that all medications have potential sideeffects and we are unable to predict every sideeffect or drug-drug interaction that may occur.    DO  11/27/2015 9:30 AM

## 2015-12-10 ENCOUNTER — Telehealth: Payer: Self-pay

## 2015-12-10 NOTE — Telephone Encounter (Signed)
, °

## 2016-01-12 ENCOUNTER — Other Ambulatory Visit: Payer: Self-pay | Admitting: Physician Assistant

## 2016-03-25 ENCOUNTER — Other Ambulatory Visit: Payer: Self-pay | Admitting: Endocrinology

## 2016-04-09 ENCOUNTER — Encounter: Payer: Self-pay | Admitting: Endocrinology

## 2016-04-09 ENCOUNTER — Ambulatory Visit (INDEPENDENT_AMBULATORY_CARE_PROVIDER_SITE_OTHER): Payer: 59 | Admitting: Endocrinology

## 2016-04-09 VITALS — BP 124/82 | HR 62 | Ht 64.0 in | Wt 187.0 lb

## 2016-04-09 DIAGNOSIS — Z0000 Encounter for general adult medical examination without abnormal findings: Secondary | ICD-10-CM

## 2016-04-09 LAB — BASIC METABOLIC PANEL
BUN: 15 mg/dL (ref 6–23)
CHLORIDE: 100 meq/L (ref 96–112)
CO2: 31 mEq/L (ref 19–32)
CREATININE: 0.75 mg/dL (ref 0.40–1.20)
Calcium: 9.7 mg/dL (ref 8.4–10.5)
GFR: 88.54 mL/min (ref >60.00)
Glucose, Bld: 94 mg/dL (ref 70–99)
Potassium: 3.7 mEq/L (ref 3.5–5.1)
Sodium: 135 mEq/L (ref 135–145)

## 2016-04-09 LAB — CBC WITH DIFFERENTIAL/PLATELET
BASOS PCT: 1.2 % (ref 0.0–3.0)
Basophils Absolute: 0.1 10*3/uL (ref 0.0–0.1)
Eosinophils Absolute: 0.2 10*3/uL (ref 0.0–0.7)
Eosinophils Relative: 3.5 % (ref 0.0–5.0)
HEMATOCRIT: 37.5 % (ref 36.0–46.0)
Hemoglobin: 12.7 g/dL (ref 12.0–15.0)
LYMPHS ABS: 1.6 10*3/uL (ref 0.7–4.0)
LYMPHS PCT: 26 % (ref 12.0–46.0)
MCHC: 33.8 g/dL (ref 30.0–36.0)
MCV: 90 fl (ref 78.0–100.0)
MONOS PCT: 13.2 % — AB (ref 3.0–12.0)
Monocytes Absolute: 0.8 10*3/uL (ref 0.1–1.0)
NEUTROS ABS: 3.5 10*3/uL (ref 1.4–7.7)
Neutrophils Relative %: 56.1 % (ref 43.0–77.0)
PLATELETS: 280 10*3/uL (ref 150.0–400.0)
RBC: 4.17 Mil/uL (ref 3.87–5.11)
RDW: 13.4 % (ref 11.5–15.5)
WBC: 6.2 10*3/uL (ref 4.0–10.5)

## 2016-04-09 LAB — HEPATIC FUNCTION PANEL
ALK PHOS: 47 U/L (ref 39–117)
ALT: 26 U/L (ref 0–35)
AST: 22 U/L (ref 0–37)
Albumin: 4.1 g/dL (ref 3.5–5.2)
BILIRUBIN DIRECT: 0.1 mg/dL (ref 0.0–0.3)
BILIRUBIN TOTAL: 0.3 mg/dL (ref 0.2–1.2)
Total Protein: 6.9 g/dL (ref 6.0–8.3)

## 2016-04-09 LAB — LIPID PANEL
CHOL/HDL RATIO: 3
Cholesterol: 217 mg/dL — ABNORMAL HIGH (ref 0–200)
HDL: 65.5 mg/dL (ref >39.00)
LDL CALC: 127 mg/dL — AB (ref 0–99)
NonHDL: 151.27
TRIGLYCERIDES: 119 mg/dL (ref 0.0–149.0)
VLDL: 23.8 mg/dL (ref 0.0–40.0)

## 2016-04-09 LAB — URINALYSIS, ROUTINE W REFLEX MICROSCOPIC
Bilirubin Urine: NEGATIVE
Hgb urine dipstick: NEGATIVE
Ketones, ur: NEGATIVE
Leukocytes, UA: NEGATIVE
Nitrite: NEGATIVE
PH: 6.5 (ref 5.0–8.0)
RBC / HPF: NONE SEEN (ref 0–?)
SPECIFIC GRAVITY, URINE: 1.015 (ref 1.000–1.030)
Total Protein, Urine: NEGATIVE
Urine Glucose: NEGATIVE
Urobilinogen, UA: 0.2 (ref 0.0–1.0)

## 2016-04-09 LAB — TSH: TSH: 3.62 u[IU]/mL (ref 0.35–4.50)

## 2016-04-09 NOTE — Progress Notes (Signed)
Subjective:    Patient ID: Linus OrnLinda K Ingraham, female    DOB: [birth year], 45 y.o.   MRN: [id number]  HPI Pt is here for regular wellness examination, and is feeling pretty well in general, and says chronic med probs are stable, except as noted below Past Medical History  Diagnosis Date  . HYPOTHYROIDISM 06/03/2007  . ANXIETY 06/03/2007  . ASTHMA 06/03/2007  . Bradycardia     Asymptomatic  . Alopecia areata 05/2002  . Allergy   . Chronic kidney disease   . Anemia     Past Surgical History  Procedure Laterality Date  . Cholecystectomy  05/2002  . Tubal ligation      Social History   Social History  . Marital Status: Married    Spouse Name: N/A  . Number of Children: N/A  . Years of Education: N/A   Occupational History  .      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 Smoking status: Never Smoker   . Smokeless tobacco: Never Used  . Alcohol Use: No  . Drug Use: No  . Sexual Activity: Yes    Birth Control/ Protection: None   Other Topics Concern  . Not on file   Social History Narrative    Current Outpatient Prescriptions on File Prior to Visit  Medication Sig Dispense Refill  . Calcium Carbonate-Vitamin D (CALTRATE 600+D) 600-400 MG-UNIT per tablet Take 2 tablets by mouth daily.     Marland Kitchen. docusate sodium (COLACE) 100 MG capsule Take 3 capsules (300 mg total) by mouth daily. 90 capsule 11  . fluticasone (FLONASE) 50 MCG/ACT nasal spray Place into both nostrils daily.    Marland Kitchen. levothyroxine (SYNTHROID, LEVOTHROID) 75 MCG tablet TAKE ONE TABLET BY MOUTH ONCE DAILY BEFORE  BREAKFAST 90 tablet 0  . Multiple Vitamin (MULTIVITAMIN) tablet Take 1 tablet by mouth daily.      . polyethylene glycol powder (MIRALAX) powder Take 17 g by mouth daily. 850 g 11   No current facility-administered medications on file prior to visit.    Allergies  Allergen Reactions  . Erythromycin     REACTION: Upset Stomach    Family History  Problem Relation Age of Onset  .  Hypothyroidism Mother   . Hypertension Mother   . Cancer Father   . Cancer Maternal Grandmother   . Cancer Maternal Grandfather   . Cancer Paternal Grandmother   . Cancer Paternal Grandfather     BP 124/82 mmHg  Pulse 62  Ht 5\' 4"  (1.626 m)  Wt 187 lb (84.823 kg)  BMI 32.08 kg/m2  SpO2 97%   Review of Systems  Constitutional: Negative for fever.  HENT: Negative for hearing loss.   Eyes: Negative for visual disturbance.  Respiratory: Negative for shortness of breath.   Cardiovascular: Negative for chest pain.  Gastrointestinal: Negative for blood in stool.  Endocrine: Negative for cold intolerance.  Genitourinary: Negative for hematuria.  Musculoskeletal: Negative for back pain.  Skin: Negative for wound.  Allergic/Immunologic: Positive for environmental allergies.  Neurological: Negative for syncope.  Hematological: Bruises/bleeds easily.  Psychiatric/Behavioral: Negative for dysphoric mood.       Objective:   Physical Exam VS: see vs page GEN: no distress HEAD: head: no deformity eyes: no periorbital swelling, no proptosis external nose and ears are normal mouth: no lesion seen NECK: supple, thyroid is not enlarged CHEST WALL: no deformity LUNGS:  Clear to auscultation BREASTS: sees gyn CV: reg rate and rhythm, no murmur ABD: abdomen  is soft, nontender.  no hepatosplenomegaly.  not distended.  no hernia.   GENITALIA/RECTAL: sees gyn MUSCULOSKELETAL: muscle bulk and strength are grossly normal.  no obvious joint swelling.  gait is normal and steady EXTEMITIES: no deformity.  no ulcer on the feet.  feet are of normal color and temp.  no edema.  bilat ingrown great toenails (but swelling/erythema/drainage) PULSES: dorsalis pedis intact bilat.  no carotid bruit NEURO:  cn 2-12 grossly intact.   readily moves all 4's.  sensation is intact to touch on the feet SKIN:  Normal texture and temperature.  No rash or suspicious lesion is visible.  Mild eczematous rash on the  extensor surface of the elbows 9pt has seen derm--similar to psoriasis) NODES:  None palpable at the neck PSYCH: alert, well-oriented.  Does not appear anxious nor depressed.   i personally reviewed electrocardiogram tracing (today): Indication: wellness Impression: normal     Assessment & Plan:  Wellness visit today, with problems stable, except as noted.  Patient is advised the following: Patient Instructions  blood tests are requested for you today.  We'll let you know about the results. Please consider these measures for your health:  minimize alcohol.  Do not use tobacco products.  Women should have an annual mammogram from age 46.  Keep firearms safely stored.  Always use seat belts.  have working smoke alarms in your home.  See an eye doctor and dentist regularly.  Never drive under the influence of alcohol or drugs (including prescription drugs).  Those with fair skin should take precautions against the sun, and should carefully examine their skin once per month, for any new or changed moles. Please return in 1 year.     Romero BellingELLISON, Brinleigh Tew, MD

## 2016-04-09 NOTE — Patient Instructions (Signed)
blood tests are requested for you today.  We'll let you know about the results. Please consider these measures for your health:  minimize alcohol.  Do not use tobacco products.  Women should have an annual mammogram from age 46.  Keep firearms safely stored.  Always use seat belts.  have working smoke alarms in your home.  See an eye doctor and dentist regularly.  Never drive under the influence of alcohol or drugs (including prescription drugs).  Those with fair skin should take precautions against the sun, and should carefully examine their skin once per month, for any new or changed moles. Please return in 1 year.

## 2016-04-10 LAB — HIV ANTIBODY (ROUTINE TESTING W REFLEX): HIV 1&2 Ab, 4th Generation: NONREACTIVE

## 2016-06-30 ENCOUNTER — Other Ambulatory Visit: Payer: Self-pay | Admitting: Endocrinology

## 2016-08-05 ENCOUNTER — Ambulatory Visit (INDEPENDENT_AMBULATORY_CARE_PROVIDER_SITE_OTHER): Payer: 59 | Admitting: Urgent Care

## 2016-08-05 VITALS — BP 118/79 | HR 92 | Temp 98.3°F | Resp 16 | Ht 64.0 in | Wt 175.2 lb

## 2016-08-05 DIAGNOSIS — J019 Acute sinusitis, unspecified: Secondary | ICD-10-CM

## 2016-08-05 MED ORDER — DIPHENHYDRAMINE HCL 50 MG PO TABS: 50.0000 mg | ORAL_TABLET | Freq: Every evening | ORAL | 0 refills | Status: DC | PRN

## 2016-08-05 MED ORDER — AMOXICILLIN 875 MG PO TABS: 875.0000 mg | ORAL_TABLET | Freq: Two times a day (BID) | ORAL | 0 refills | Status: AC

## 2016-08-05 NOTE — Patient Instructions (Addendum)
Try Sudafed 120mg , Allegra for your sinus congestion. Take benadryl to help you sleep.   Sinusitis, Adult Sinusitis is redness, soreness, and inflammation of the paranasal sinuses. Paranasal sinuses are air pockets within the bones of your face. They are located beneath your eyes, in the middle of your forehead, and above your eyes. In healthy paranasal sinuses, mucus is able to drain out, and air is able to circulate through them by way of your nose. However, when your paranasal sinuses are inflamed, mucus and air can become trapped. This can allow bacteria and other germs to grow and cause infection. Sinusitis can develop quickly and last only a short time (acute) or continue over a long period (chronic). Sinusitis that lasts for more than 12 weeks is considered chronic. CAUSES Causes of sinusitis include:  Allergies.  Structural abnormalities, such as displacement of the cartilage that separates your nostrils (deviated septum), which can decrease the air flow through your nose and sinuses and affect sinus drainage.  Functional abnormalities, such as when the small hairs (cilia) that line your sinuses and help remove mucus do not work properly or are not present. SIGNS AND SYMPTOMS Symptoms of acute and chronic sinusitis are the same. The primary symptoms are pain and pressure around the affected sinuses. Other symptoms include:  Upper toothache.  Earache.  Headache.  Bad breath.  Decreased sense of smell and taste.  A cough, which worsens when you are lying flat.  Fatigue.  Fever.  Thick drainage from your nose, which often is green and may contain pus (purulent).  Swelling and warmth over the affected sinuses. DIAGNOSIS Your health care provider will perform a physical exam. During your exam, your health care provider may perform any of the following to help determine if you have acute sinusitis or chronic sinusitis:  Look in your nose for signs of abnormal growths in your  nostrils (nasal polyps).  Tap over the affected sinus to check for signs of infection.  View the inside of your sinuses using an imaging device that has a light attached (endoscope). If your health care provider suspects that you have chronic sinusitis, one or more of the following tests may be recommended:  Allergy tests.  Nasal culture. A sample of mucus is taken from your nose, sent to a lab, and screened for bacteria.  Nasal cytology. A sample of mucus is taken from your nose and examined by your health care provider to determine if your sinusitis is related to an allergy. TREATMENT Most cases of acute sinusitis are related to a viral infection and will resolve on their own within 10 days. Sometimes, medicines are prescribed to help relieve symptoms of both acute and chronic sinusitis. These may include pain medicines, decongestants, nasal steroid sprays, or saline sprays. However, for sinusitis related to a bacterial infection, your health care provider will prescribe antibiotic medicines. These are medicines that will help kill the bacteria causing the infection. Rarely, sinusitis is caused by a fungal infection. In these cases, your health care provider will prescribe antifungal medicine. For some cases of chronic sinusitis, surgery is needed. Generally, these are cases in which sinusitis recurs more than 3 times per year, despite other treatments. HOME CARE INSTRUCTIONS  Drink plenty of water. Water helps thin the mucus so your sinuses can drain more easily.  Use a humidifier.  Inhale steam 3-4 times a day (for example, sit in the bathroom with the shower running).  Apply a warm, moist washcloth to your face 3-4 times a  day, or as directed by your health care provider.  Use saline nasal sprays to help moisten and clean your sinuses.  Take medicines only as directed by your health care provider.  If you were prescribed either an antibiotic or antifungal medicine, finish it all  even if you start to feel better. SEEK IMMEDIATE MEDICAL CARE IF:  You have increasing pain or severe headaches.  You have nausea, vomiting, or drowsiness.  You have swelling around your face.  You have vision problems.  You have a stiff neck.  You have difficulty breathing.   This information is not intended to replace advice given to you by your health care provider. Make sure you discuss any questions you have with your health care provider.   Document Released: 09/20/2005 Document Revised: 10/11/2014 Document Reviewed: 10/05/2011 Elsevier Interactive Patient Education 2016 [employer]     IF you received an x-ray today, you will receive an invoice from [facility]. Please contact [facility] at [phone] with questions or concerns regarding your invoice.   IF you received labwork today, you will receive an invoice from [facility]. Please contact [facility] at [phone] with questions or concerns regarding your invoice.   Our billing staff will not be able to assist you with questions regarding bills from these companies.  You will be contacted with the lab results as soon as they are available. The fastest way to get your results is to activate your My Chart account. Instructions are located on the last page of this paperwork. If you have not heard from us regarding the results in 2 weeks, please contact this office.

## 2016-08-05 NOTE — Progress Notes (Signed)
    MRN: [id number] DOB: 03/08/1971  Subjective:   Audrey Christian is a 45 y.o. female presenting for chief complaint of Sinusitis (pressure in face/ x1 wk/ yeelow mucus); Ear Pain (left/ x 1wk); and Immunizations (flu shot)  Reports 1 week history of worsening sinus pain, sinus headaches, difficulty breathing from her congestion, hoarseness, sore throat, subjective fever, left sided lymph node pain of her neck and her left ear. Also had a cough which is now improved. Has had a very difficult time sleeping, laying down. Has tried nasal saline, Vick's Vapor rub, Sudafed, Allegra, NyQuil. Denies smoking cigarettes. Has seasonal asthma mostly with dry air or evergreen trees. Denies ear drainage, chest pain, wheezing, n/v, abdominal pain, rashes, body aches.  Audrey Christian has a current medication list which includes the following prescription(s): calcium carbonate-vitamin d, docusate sodium, fluticasone, levothyroxine, multivitamin, and polyethylene glycol powder. Also is allergic to erythromycin.  Audrey Christian  has a past medical history of Allergy; Alopecia areata (05/2002); Anemia; ANXIETY (06/03/2007); ASTHMA (06/03/2007); Bradycardia; Chronic kidney disease; and HYPOTHYROIDISM (06/03/2007). Also  has a past surgical history that includes Cholecystectomy (05/2002) and Tubal ligation.  Objective:   Vitals: BP 118/79   Pulse 92   Temp 98.3 F (36.8 C) (Oral)   Resp 16   Ht 5\' 4"  (1.626 m)   Wt 175 lb 3.2 oz (79.5 kg)   LMP 07/29/2016   SpO2 98%   BMI 30.07 kg/m   Physical Exam  Constitutional: She is oriented to person, place, and time. She appears well-developed and well-nourished.  HENT:  TM's intact bilaterally, no effusions or erythema. Nasal turbinates pink and moist, nasal passages minimally patent. Right maxillary and frontal sinus tenderness. Oropharynx clear, mucous membranes moist, dentition in good repair.  Eyes: Right eye exhibits no discharge. Left eye exhibits no discharge. No scleral  icterus.  Neck: Normal range of motion. Neck supple.  Cardiovascular: Normal rate, regular rhythm and intact distal pulses.  Exam reveals no gallop and no friction rub.   No murmur heard. Pulmonary/Chest: No respiratory distress. She has no wheezes. She has no rales.  Lymphadenopathy:    She has no cervical adenopathy.  Neurological: She is alert and oriented to person, place, and time.  Skin: Skin is warm and dry.   Assessment and Plan :   1. Acute sinusitis, recurrence not specified, unspecified location - Will cover for sinusitis with amoxicillin. Supportive care recommended otherwise.  - If no improvement or symptoms do not resolve return to clinic in 1 week.  Wallis BambergMario Lateia Fraser, PA-C [facility] [phone] 08/05/2016 8:26 AM

## 2016-08-11 ENCOUNTER — Ambulatory Visit (INDEPENDENT_AMBULATORY_CARE_PROVIDER_SITE_OTHER): Payer: 59 | Admitting: Urgent Care

## 2016-08-11 VITALS — BP 118/74 | HR 87 | Temp 98.0°F | Resp 16 | Ht 64.0 in | Wt 175.0 lb

## 2016-08-11 DIAGNOSIS — J329 Chronic sinusitis, unspecified: Secondary | ICD-10-CM

## 2016-08-11 MED ORDER — PREDNISONE 10 MG PO TABS: 30.0000 mg | ORAL_TABLET | Freq: Every day | ORAL | 0 refills | Status: DC

## 2016-08-11 MED ORDER — DOXYCYCLINE HYCLATE 100 MG PO CAPS: 100.0000 mg | ORAL_CAPSULE | Freq: Two times a day (BID) | ORAL | 0 refills | Status: DC

## 2016-08-11 NOTE — Progress Notes (Signed)
    MRN: [id number] DOB: 08-Mar-1971  Subjective:   Audrey Christian is a 45 y.o. female presenting for follow up on sinusitis.   She was last seen 08/05/2016, started on Amoxicillin for sinusitis. She has continued to take Allegra, Flonase and Sudafed. Notes significant improvement in fever, sinus pain. However, she is worried that she still has some sinus pain and feels boggy and swollen within her sinuses.  Audrey Christian has a current medication list which includes the following prescription(s): amoxicillin, calcium carbonate-vitamin d, saline, docusate sodium, fexofenadine, fluticasone, levothyroxine, liniments, multivitamin, polyethylene glycol powder, pseudoephedrine, and diphenhydramine. Also is allergic to erythromycin.  Audrey Christian  has a past medical history of Allergy; Alopecia areata (05/2002); Anemia; ANXIETY (06/03/2007); ASTHMA (06/03/2007); Bradycardia; Chronic kidney disease; and HYPOTHYROIDISM (06/03/2007). Also  has a past surgical history that includes Cholecystectomy (05/2002) and Tubal ligation.   Objective:   Vitals: BP 118/74   Pulse 87   Temp 98 F (36.7 C) (Oral)   Resp 16   Ht 5\' 4"  (1.626 m)   Wt 175 lb (79.4 kg)   LMP 07/29/2016   SpO2 96%   BMI 30.04 kg/m   Physical Exam  Constitutional: She is oriented to person, place, and time. She appears well-developed and well-nourished.  HENT:  TM's intact bilaterally, no effusions or erythema. Nasal turbinates erythematous and mildly edematous, nasal passages patent. Bilateral maxillary sinus tenderness. Oropharynx clear, mucous membranes moist, dentition in good repair.   Eyes: Right eye exhibits no discharge. Left eye exhibits no discharge.  Neck: Normal range of motion. Neck supple.  Cardiovascular: Normal rate, regular rhythm and intact distal pulses.  Exam reveals no gallop and no friction rub.   No murmur heard. Pulmonary/Chest: No respiratory distress. She has no wheezes. She has no rales.  Lymphadenopathy:    She  has no cervical adenopathy.  Neurological: She is alert and oriented to person, place, and time.  Skin: Skin is warm and dry.   Assessment and Plan :   1. Chronic sinusitis, unspecified location - Start doxycycline, short steroid course. Continue Flonase, Allegra, Sudafed. Hydrate very well. Follow up on Monday if no improvement, consider sinus/head CT or referral to ENT. Patient is in agreement.  Wallis BambergMario Deziah Renwick, PA-C [facility] [phone] 08/11/2016 3:33 PM

## 2016-08-11 NOTE — Patient Instructions (Addendum)
Sinusitis, Adult Sinusitis is redness, soreness, and inflammation of the paranasal sinuses. Paranasal sinuses are air pockets within the bones of your face. They are located beneath your eyes, in the middle of your forehead, and above your eyes. In healthy paranasal sinuses, mucus is able to drain out, and air is able to circulate through them by way of your nose. However, when your paranasal sinuses are inflamed, mucus and air can become trapped. This can allow bacteria and other germs to grow and cause infection. Sinusitis can develop quickly and last only a short time (acute) or continue over a long period (chronic). Sinusitis that lasts for more than 12 weeks is considered chronic. CAUSES Causes of sinusitis include:  Allergies.  Structural abnormalities, such as displacement of the cartilage that separates your nostrils (deviated septum), which can decrease the air flow through your nose and sinuses and affect sinus drainage.  Functional abnormalities, such as when the small hairs (cilia) that line your sinuses and help remove mucus do not work properly or are not present. SIGNS AND SYMPTOMS Symptoms of acute and chronic sinusitis are the same. The primary symptoms are pain and pressure around the affected sinuses. Other symptoms include:  Upper toothache.  Earache.  Headache.  Bad breath.  Decreased sense of smell and taste.  A cough, which worsens when you are lying flat.  Fatigue.  Fever.  Thick drainage from your nose, which often is green and may contain pus (purulent).  Swelling and warmth over the affected sinuses. DIAGNOSIS Your health care provider will perform a physical exam. During your exam, your health care provider may perform any of the following to help determine if you have acute sinusitis or chronic sinusitis:  Look in your nose for signs of abnormal growths in your nostrils (nasal polyps).  Tap over the affected sinus to check for signs of  infection.  View the inside of your sinuses using an imaging device that has a light attached (endoscope). If your health care provider suspects that you have chronic sinusitis, one or more of the following tests may be recommended:  Allergy tests.  Nasal culture. A sample of mucus is taken from your nose, sent to a lab, and screened for bacteria.  Nasal cytology. A sample of mucus is taken from your nose and examined by your health care provider to determine if your sinusitis is related to an allergy. TREATMENT Most cases of acute sinusitis are related to a viral infection and will resolve on their own within 10 days. Sometimes, medicines are prescribed to help relieve symptoms of both acute and chronic sinusitis. These may include pain medicines, decongestants, nasal steroid sprays, or saline sprays. However, for sinusitis related to a bacterial infection, your health care provider will prescribe antibiotic medicines. These are medicines that will help kill the bacteria causing the infection. Rarely, sinusitis is caused by a fungal infection. In these cases, your health care provider will prescribe antifungal medicine. For some cases of chronic sinusitis, surgery is needed. Generally, these are cases in which sinusitis recurs more than 3 times per year, despite other treatments. HOME CARE INSTRUCTIONS  Drink plenty of water. Water helps thin the mucus so your sinuses can drain more easily.  Use a humidifier.  Inhale steam 3-4 times a day (for example, sit in the bathroom with the shower running).  Apply a warm, moist washcloth to your face 3-4 times a day, or as directed by your health care provider.  Use saline nasal sprays to help   moisten and clean your sinuses.  Take medicines only as directed by your health care provider.  If you were prescribed either an antibiotic or antifungal medicine, finish it all even if you start to feel better. SEEK IMMEDIATE MEDICAL CARE IF:  You have  increasing pain or severe headaches.  You have nausea, vomiting, or drowsiness.  You have swelling around your face.  You have vision problems.  You have a stiff neck.  You have difficulty breathing.   This information is not intended to replace advice given to you by your health care provider. Make sure you discuss any questions you have with your health care provider.   Document Released: 09/20/2005 Document Revised: 10/11/2014 Document Reviewed: 10/05/2011 Elsevier Interactive Patient Education 2016 Elsevier Inc.    IF you received an x-ray today, you will receive an invoice from [facility]. Please contact [facility] at [phone] with questions or concerns regarding your invoice.   IF you received labwork today, you will receive an invoice from [facility]. Please contact [facility] at [phone] with questions or concerns regarding your invoice.   Our billing staff will not be able to assist you with questions regarding bills from these companies.  You will be contacted with the lab results as soon as they are available. The fastest way to get your results is to activate your My Chart account. Instructions are located on the last page of this paperwork. If you have not heard from us regarding the results in 2 weeks, please contact this office.      

## 2016-08-31 ENCOUNTER — Other Ambulatory Visit: Payer: Self-pay | Admitting: Urgent Care

## 2016-09-28 ENCOUNTER — Other Ambulatory Visit: Payer: Self-pay

## 2016-09-28 MED ORDER — LEVOTHYROXINE SODIUM 75 MCG PO TABS: ORAL_TABLET | ORAL | 0 refills | Status: DC

## 2016-09-30 ENCOUNTER — Other Ambulatory Visit: Payer: Self-pay

## 2016-10-18 DIAGNOSIS — M25521 Pain in right elbow: Secondary | ICD-10-CM | POA: Diagnosis not present

## 2016-11-20 DIAGNOSIS — K120 Recurrent oral aphthae: Secondary | ICD-10-CM | POA: Diagnosis not present

## 2016-11-26 ENCOUNTER — Ambulatory Visit (INDEPENDENT_AMBULATORY_CARE_PROVIDER_SITE_OTHER): Payer: 59 | Admitting: Urgent Care

## 2016-11-26 VITALS — BP 128/80 | HR 53 | Temp 98.5°F | Resp 16 | Ht 63.0 in | Wt 166.2 lb

## 2016-11-26 DIAGNOSIS — R0982 Postnasal drip: Secondary | ICD-10-CM | POA: Diagnosis not present

## 2016-11-26 DIAGNOSIS — J029 Acute pharyngitis, unspecified: Secondary | ICD-10-CM | POA: Diagnosis not present

## 2016-11-26 DIAGNOSIS — J3489 Other specified disorders of nose and nasal sinuses: Secondary | ICD-10-CM

## 2016-11-26 LAB — POCT RAPID STREP A (OFFICE): RAPID STREP A SCREEN: NEGATIVE

## 2016-11-26 MED ORDER — AMOXICILLIN 500 MG PO CAPS: 500.0000 mg | ORAL_CAPSULE | Freq: Two times a day (BID) | ORAL | 0 refills | Status: DC

## 2016-11-26 MED ORDER — CHLORHEXIDINE GLUCONATE 0.12 % MT SOLN: 15.0000 mL | Freq: Two times a day (BID) | OROMUCOSAL | 0 refills | Status: DC

## 2016-11-26 NOTE — Patient Instructions (Addendum)
    Sore Throat When you have a sore throat, your throat may:  Hurt.  Burn.  Feel irritated.  Feel scratchy. Many things can cause a sore throat, including:  An infection.  Allergies.  Dryness in the air.  Smoke or pollution.  Gastroesophageal reflux disease (GERD).  A tumor. A sore throat can be the first sign of another sickness. It can happen with other problems, like coughing or a fever. Most sore throats go away without treatment. Follow these instructions at home:  Take over-the-counter medicines only as told by your doctor.  Drink enough fluids to keep your pee (urine) clear or pale yellow.  Rest when you feel you need to.  To help with pain, try:  Sipping warm liquids, such as broth, herbal tea, or warm water.  Eating or drinking cold or frozen liquids, such as frozen ice pops.  Gargling with a salt-water mixture 3-4 times a day or as needed. To make a salt-water mixture, add -1 tsp of salt in 1 cup of warm water. Mix it until you cannot see the salt anymore.  Sucking on hard candy or throat lozenges.  Putting a cool-mist humidifier in your bedroom at night.  Sitting in the bathroom with the door closed for 5-10 minutes while you run hot water in the shower.  Do not use any tobacco products, such as cigarettes, chewing tobacco, and e-cigarettes. If you need help quitting, ask your doctor. Contact a doctor if:  You have a fever for more than 2-3 days.  You keep having symptoms for more than 2-3 days.  Your throat does not get better in 7 days.  You have a fever and your symptoms suddenly get worse. Get help right away if:  You have trouble breathing.  You cannot swallow fluids, soft foods, or your saliva.  You have swelling in your throat or neck that gets worse.  You keep feeling like you are going to throw up (vomit).  You keep throwing up. This information is not intended to replace advice given to you by your health care provider. Make  sure you discuss any questions you have with your health care provider. Document Released: 06/29/2008 Document Revised: 05/16/2016 Document Reviewed: 07/11/2015 Elsevier Interactive Patient Education  2017 Elsevier Inc.   IF you received an x-ray today, you will receive an invoice from [facility]. Please contact [facility] at [phone] with questions or concerns regarding your invoice.   IF you received labwork today, you will receive an invoice from [facility]. Please contact [facility] at [phone] with questions or concerns regarding your invoice.   Our billing staff will not be able to assist you with questions regarding bills from these companies.  You will be contacted with the lab results as soon as they are available. The fastest way to get your results is to activate your My Chart account. Instructions are located on the last page of this paperwork. If you have not heard from us regarding the results in 2 weeks, please contact this office.      

## 2016-11-26 NOTE — Progress Notes (Signed)
  MRN: [id number] DOB: [birth year]  Subjective:   Audrey Christian is a 46 y.o. female presenting for chief complaint of sores in throat (seen at [facility] on last Sat., treated with Magicmouth wash, "helped, but not gone away")  Reports >1 week history of sore throat with sores in her throat, left lymph node pain, sinus congestion, frontal sinus headaches. [facility] on Magic Mouthwash and has had some relief but not resolution of her symptoms. Admits that she has been around children with strep throat this week. Denies fever, eye pain, ear pain, ear drainage, tooth pain, bleeding gums, cough.  Audrey Christian has a current medication list which includes the following prescription(s): calcium carbonate-vitamin d, docusate sodium, fexofenadine, fluticasone, levothyroxine, liniments, multivitamin, polyethylene glycol powder, [facility] allergy, and saline. Also is allergic to erythromycin.  Audrey Christian  has a past medical history of Allergy; Alopecia areata (05/2002); Anemia; ANXIETY (06/03/2007); ASTHMA (06/03/2007); Bradycardia; Chronic kidney disease; and HYPOTHYROIDISM (06/03/2007). Also  has a past surgical history that includes Cholecystectomy (05/2002) and Tubal ligation.  Objective:   Vitals: BP 128/80   Pulse (!) 53   Temp 98.5 F (36.9 C) (Oral)   Resp 16   Ht 5\' 3"  (1.6 m)   Wt 166 lb 3.2 oz (75.4 kg)   LMP 11/05/2016 (Approximate)   SpO2 100%   BMI 29.44 kg/m   Physical Exam  Constitutional: She is oriented to person, place, and time. She appears well-developed and well-nourished.  HENT:  TM's intact bilaterally, no effusions or erythema. Nasal turbinates pink and moist, nasal passages patent. Frontal sinus tenderness. Oropharynx with post-nasal drainage, erythema but no exudates, mucous membranes moist, dentition in good repair.  Eyes: Right eye exhibits no discharge. Left eye exhibits no discharge.  Neck: Normal range of motion. Neck supple.  Cardiovascular:  Normal rate.   Pulmonary/Chest: Effort normal.  Lymphadenopathy:    She has cervical adenopathy (left-sided).  Neurological: She is alert and oriented to person, place, and time.  Skin: Skin is warm and dry.   Results for orders placed or performed in visit on 11/26/16 (from the past 24 hour(s))  POCT rapid strep A     Status: None   Collection Time: 11/26/16  5:10 PM  Result Value Ref Range   Rapid Strep A Screen Negative Negative   Assessment and Plan :   1. Frontal sinus pain 2. Sore throat 3. Post-nasal drainage - Will have patient start amoxicillin to address sinusitis which I suspect is causing her drainage and throat irritation. Will also have patient use chlorhexadine mouthwash. Recheck in 1 week if no improvement.  Wallis BambergMario Azreal Stthomas, PA-C [facility] [phone] 11/26/2016  4:56 PM

## 2016-11-27 ENCOUNTER — Other Ambulatory Visit: Payer: Self-pay | Admitting: Emergency Medicine

## 2016-11-27 DIAGNOSIS — K5909 Other constipation: Secondary | ICD-10-CM

## 2016-11-29 LAB — CULTURE, GROUP A STREP: STREP A CULTURE: NEGATIVE

## 2016-11-29 NOTE — Telephone Encounter (Signed)
Meds ordered this encounter  Medications  . polyethylene glycol powder (GLYCOLAX/MIRALAX) powder    Sig: TAKE [id number] GRAMS BY MOUTH DAILY    Dispense:  527 g    Refill:  0    Patient notified via My Chart.

## 2016-12-02 ENCOUNTER — Ambulatory Visit (INDEPENDENT_AMBULATORY_CARE_PROVIDER_SITE_OTHER): Payer: 59 | Admitting: Family Medicine

## 2016-12-02 VITALS — BP 102/60 | HR 67 | Temp 98.5°F | Resp 18 | Ht 63.0 in | Wt 166.0 lb

## 2016-12-02 DIAGNOSIS — M79604 Pain in right leg: Secondary | ICD-10-CM | POA: Diagnosis not present

## 2016-12-02 DIAGNOSIS — W57XXXA Bitten or stung by nonvenomous insect and other nonvenomous arthropods, initial encounter: Secondary | ICD-10-CM

## 2016-12-02 MED ORDER — DOXYCYCLINE HYCLATE 100 MG PO CAPS
100.0000 mg | ORAL_CAPSULE | Freq: Two times a day (BID) | ORAL | 0 refills | Status: DC
Start: 2016-12-02 — End: 2016-12-25

## 2016-12-02 NOTE — Patient Instructions (Addendum)
-Complete your Amoxicillin for pharyngitis. Start Doxycycline 100 mg twice daily for 10 days for the tick on back your right leg.  -Apply hydrocortisone ointment for wound itchiness.    IF you received an x-ray today, you will receive an invoice from [facility]. Please contact [facility] at [phone] with questions or concerns regarding your invoice.   IF you received labwork today, you will receive an invoice from [facility]. Please contact [facility] at [phone] with questions or concerns regarding your invoice.   Our billing staff will not be able to assist you with questions regarding bills from these companies.  You will be contacted with the lab results as soon as they are available. The fastest way to get your results is to activate your My Chart account. Instructions are located on the last page of this paperwork. If you have not heard from [place] regarding the results in 2 weeks, please contact this office.     Lyme Disease Lyme disease is an infection that affects many parts of the body, including the skin, joints, and nervous system. It is a bacterial infection that starts from the bite of an infected tick. The infection can spread, and some of the symptoms are similar to the flu. If Lyme disease is not treated, it may cause joint pain, swelling, numbness, problems thinking, fatigue, muscle weakness, and other problems. What are the causes? This condition is caused by bacteria called Borrelia burgdorferi. You can get Lyme disease by being bitten by an infected tick. The tick must be attached to your skin to pass along the infection. Deer often carry infected ticks. What increases the risk? The following factors may make you more likely to develop this condition:  Living in or visiting these areas in the [place]  [place].  The [place] states.  The [place].  Spending time in wooded or grassy areas.  Being outdoors with exposed  skin.  Camping, gardening, hiking, fishing, or hunting outdoors.  Failing to remove a tick from your skin within 3-4 days. What are the signs or symptoms? Symptoms of this condition include:  A round, red rash that surrounds the center of the tick bite. This is the first sign of infection. The center of the rash may be blood colored or have tiny blisters.  Fatigue.  Headache.  Chills and fever.  General achiness.  Joint pain, often in the knees.  Muscle pain.  Swollen lymph glands.  Stiff neck. How is this diagnosed? This condition is diagnosed based on:  Your symptoms and medical history.  A physical exam.  A blood test. How is this treated? The main treatment for this condition is antibiotic medicine, which is usually taken by mouth (orally). The length of treatment depends on how soon after a tick bite you begin taking the medicine. In some cases, treatment is necessary for several weeks. If the infection is severe, antibiotics may need to be given through an IV tube that is inserted into one of your veins. Follow these instructions at home:  Take your antibiotic medicine as told by your health care provider. Do not stop taking the antibiotic even if you start to feel better.  Ask your health care provider about takinga probiotic in between doses of your antibiotic to help avoid stomach upset or diarrhea.  Check with your health care provider before supplementing your treatment. Many alternative therapies have not been proven and may be harmful to you.  Keep all follow-up visits as told by your health  care provider. This is important. How is this prevented? You can become reinfected if you get another tick bite from an infected tick. Take these steps to help prevent an infection:  Cover your skin with light-colored clothing when you are outdoors in the spring and summer months.  Spray clothing and skin with bug spray. The spray should be 20-30% DEET.  Avoid  wooded, grassy, and shaded areas.  Remove yard litter, brush, trash, and plants that attract deer and rodents.  Check yourself for ticks when you come indoors.  Wash clothing worn each day.  Check your pets for ticks before they come inside.  If you find a tick:  Remove it with tweezers.  Clean your hands and the bite area with rubbing alcohol or soap and water. Pregnant women should take special care to avoid tick bites because the infection can be passed along to the fetus. Contact a health care provider if:  You have symptoms after treatment.  You have removed a tick and want to bring it to your health care provider for testing. Get help right away if:  You have an irregular heartbeat.  You have nerve pain.  Your face feels numb. This information is not intended to replace advice given to you by your health care provider. Make sure you discuss any questions you have with your health care provider. Document Released: 12/27/2000 Document Revised: 05/11/2016 Document Reviewed: 05/11/2016 Elsevier Interactive Patient Education  2017 [employer]

## 2016-12-02 NOTE — Progress Notes (Signed)
Subjective:  Chief Complaint  Patient presents with  . Insect Bite    Tick, Right Calf, Pt has insect w/her    Audrey Christian is a 46 y.o. female who presents for evaluation of tick bite x 4 days posterior right leg. The patient denies fever, chills, cold sweats, warmth and drainage. Reports severe itch at the site of insect bite. On amoxil for recent diagnosis of pharyngitis. Reports throat remains mildly sore, although improving. Reports working in yard x 4 days ago and noticed the tick about 16 hours later while showering and feeling the sensation of something biting her. Only applied alcohol to site.   ROS See HPI   Objective:  Physical Exam  Constitutional: She is oriented to person, place, and time. She appears well-developed and well-nourished.  Cardiovascular: Normal rate, regular rhythm, normal heart sounds and intact distal pulses.   Neurological: She is alert and oriented to person, place, and time.  Skin: Rash noted. There is erythema.  Middle of posterior leg erythremic harden mass with a defined head present. Non fluctuate.        Assessment:  1. Tick bite, initial encounter Small black tick. Patient brought the actual insect into the office. Due to hardness and erythremia at the site of bite, will treat empirically with Doxycycline.    Plan:  -Doxycyline 100 mg twice daily x 10 days -May use hydrocortisone cream for relief of itching. -Information provided regarding warning signs of worsening infection.  Return for follow-up as needed.  Godfrey PickKimberly S. Tiburcio PeaHarris, MSN, FNP-C Primary Care at [facility] [phone]

## 2016-12-19 ENCOUNTER — Other Ambulatory Visit: Payer: Self-pay | Admitting: Endocrinology

## 2016-12-19 NOTE — Telephone Encounter (Signed)
Please refill x 1 Ov is due  

## 2016-12-25 ENCOUNTER — Ambulatory Visit (INDEPENDENT_AMBULATORY_CARE_PROVIDER_SITE_OTHER): Payer: 59 | Admitting: Physician Assistant

## 2016-12-25 VITALS — BP 114/78 | HR 62 | Temp 97.9°F | Resp 16 | Ht 63.0 in | Wt 165.0 lb

## 2016-12-25 DIAGNOSIS — K120 Recurrent oral aphthae: Secondary | ICD-10-CM | POA: Diagnosis not present

## 2016-12-25 MED ORDER — DEXAMETHASONE 0.5 MG/5ML PO SOLN: 0.5000 mg | Freq: Three times a day (TID) | ORAL | 0 refills | Status: DC

## 2016-12-25 MED ORDER — MAGIC MOUTHWASH W/LIDOCAINE: 10.0000 mL | ORAL | 0 refills | Status: DC | PRN

## 2016-12-25 NOTE — Patient Instructions (Signed)
     IF you received an x-ray today, you will receive an invoice from [facility]. Please contact [facility] at [phone] with questions or concerns regarding your invoice.   IF you received labwork today, you will receive an invoice from [facility]. Please contact [facility] at [phone] with questions or concerns regarding your invoice.   Our billing staff will not be able to assist you with questions regarding bills from these companies.  You will be contacted with the lab results as soon as they are available. The fastest way to get your results is to activate your My Chart account. Instructions are located on the last page of this paperwork. If you have not heard from us regarding the results in 2 weeks, please contact this office.     

## 2016-12-25 NOTE — Progress Notes (Signed)
Patient ID: [name], female    DOB: 06-Sep-1971, 46 y.o.   MRN: [id number]  PCP: Romero Belling, MD  Chief Complaint  Patient presents with  . Mouth Lesions    for about a month, used listerine and magic mthwsh    Subjective:   Presents for evaluation of lesions in her mouth x 1 month.  Improvement has stalled. Continues to develop new lesions. Nothing so far has really helped. The MMW numbs the discomfort temporarily. Throat gets really dry.  Nothing different: toothpaste, toothbrush. Has tried Listerine without benefit.  No fever, chills. Cervical lymphadenopathy may still persist, and notes a baseline palpable occipitable node. No other lymphadenopathy. Has cut out chewing gum, citrus, acidic foods, sugars.  Uses [employer] (original) toothpaste. Brushes BID. Flosses at least twice daily. Last dentist visit about 6 months ago. Next visit is next week.  Has IBS.    Review of Systems As above.    Patient Active Problem List   Diagnosis Date Noted  . Polyp, corpus uteri 06/12/2015  . Forgetfulness 04/01/2015  . Multinodular goiter 04/01/2015  . Iron deficiency anemia 12/13/2012  . Routine general medical examination at a health care facility 12/13/2012  . Hypothyroidism 06/03/2007  . ALLERGIC RHINITIS 06/03/2007  . ASTHMA 06/03/2007     Prior to Admission medications   Medication Sig Start Date End Date Taking? Authorizing Provider  Calcium Carbonate-Vitamin D (CALTRATE 600+D) 600-400 MG-UNIT per tablet Take 2 tablets by mouth daily.    Yes Historical Provider, MD  chlorhexidine (PERIDEX) 0.12 % solution Use as directed 15 mLs in the mouth or throat 2 (two) times daily. 11/26/16  Yes Wallis Bamberg, PA-C  clobetasol cream (TEMOVATE) 0.05 %  12/20/16  Yes Historical Provider, MD  docusate sodium (COLACE) 100 MG capsule Take 3 capsules (300 mg total) by mouth daily. 11/05/15  Yes Collene Gobble, MD  fexofenadine [facility] ALLERGY) 60 MG tablet Take 60 mg by  mouth 2 (two) times daily.   Yes Historical Provider, MD  fluticasone (FLONASE) 50 MCG/ACT nasal spray Place into both nostrils daily.   Yes Historical Provider, MD  levothyroxine (SYNTHROID, LEVOTHROID) 75 MCG tablet TAKE ONE TABLET BY MOUTH ONCE DAILY BEFORE BREAKFAST 12/20/16  Yes Romero Belling, MD  Liniments (VICKS BABYRUB EX) Apply topically.   Yes Historical Provider, MD  Multiple Vitamin (MULTIVITAMIN) tablet Take 1 tablet by mouth daily.     Yes Historical Provider, MD  polyethylene glycol powder (GLYCOLAX/MIRALAX) powder TAKE 17 GRAMS BY MOUTH DAILY 11/29/16  Yes Jaquell Seddon, PA-C  SRONYX 0.1-20 MG-MCG tablet TAKE 1 TABLET(S) EVERY DAY BY ORAL ROUTE. 12/16/16   Historical Provider, MD     Allergies  Allergen Reactions  . Erythromycin     REACTION: Upset Stomach       Objective:  Physical Exam  Constitutional: She is oriented to person, place, and time. She appears well-developed and well-nourished. She is active and cooperative. No distress.  BP 114/78 (BP Location: Right Arm, Patient Position: Sitting, Cuff Size: Small)   Pulse 62   Temp 97.9 F (36.6 C) (Oral)   Resp 16   Ht 5\' 3"  (1.6 m)   Wt 165 lb (74.8 kg)   SpO2 100%   BMI 29.23 kg/m   HENT:  Head: Normocephalic and atraumatic.  Right Ear: Hearing, tympanic membrane, external ear and ear canal normal.  Left Ear: Hearing, tympanic membrane, external ear and ear canal normal.  Nose: Nose normal.  Mouth/Throat: Oropharynx is clear  and moist. Mucous membranes are not pale, not dry and not cyanotic. Oral lesions (scattered ulcers, small, 3-4 mm) present.  Eyes: Conjunctivae are normal. No scleral icterus.  Neck: Normal range of motion. Neck supple. No thyromegaly present.  Cardiovascular: Normal rate, regular rhythm and normal heart sounds.   Pulses:      Radial pulses are 2+ on the right side, and 2+ on the left side.  Pulmonary/Chest: Effort normal and breath sounds normal.  Lymphadenopathy:       Head (right  side): No tonsillar, no preauricular, no posterior auricular and no occipital adenopathy present.       Head (left side): No tonsillar, no preauricular, no posterior auricular and no occipital adenopathy present.    She has no cervical adenopathy.       Right: No supraclavicular adenopathy present.       Left: No supraclavicular adenopathy present.  Neurological: She is alert and oriented to person, place, and time. No sensory deficit.  Skin: Skin is warm, dry and intact. No rash noted. No cyanosis or erythema. Nails show no clubbing.  Psychiatric: She has a normal mood and affect. Her speech is normal and behavior is normal.           Assessment & Plan:   1. Aphthous stomatitis Trial of dexamethasone and MMW. She sees her dentist next week and will ask about it. If no better, would wonder if GI could help, given her IBS. - HIV antibody - HSV(herpes simplex vrs) 1+2 ab-IgG - Epstein-Barr virus VCA antibody panel - CBC with Differential/Platelet - Rheumatoid Arthritis Profile - CMV abs, IgG+IgM (cytomegalovirus) - Coxsackie A virus antibodies - Coxsackie B virus antibodies - dexamethasone (DECADRON) 0.5 MG/5ML solution; Take 5 mLs (0.5 mg total) by mouth 3 (three) times daily. Hold in mouth for 5 minutes before spitting. Wait 30 minutes to eat or drink.  Dispense: 100 mL; Refill: 0 - Sedimentation Rate - magic mouthwash w/lidocaine SOLN; Take 10 mLs by mouth every 2 (two) hours as needed for mouth pain.  Dispense: 360 mL; Refill: 0    Return if symptoms worsen or fail to improve.   Fernande Brashelle S. Issa Kosmicki, PA-C Primary Care at [facility]

## 2016-12-29 LAB — CBC WITH DIFFERENTIAL/PLATELET
BASOS: 1 %
Basophils Absolute: 0 10*3/uL (ref 0.0–0.2)
EOS (ABSOLUTE): 0.3 10*3/uL (ref 0.0–0.4)
EOS: 5 %
HEMATOCRIT: 37.3 % (ref 34.0–46.6)
Hemoglobin: 12.1 g/dL (ref 11.1–15.9)
IMMATURE GRANS (ABS): 0 10*3/uL (ref 0.0–0.1)
IMMATURE GRANULOCYTES: 0 %
LYMPHS: 30 %
Lymphocytes Absolute: 1.6 10*3/uL (ref 0.7–3.1)
MCH: 29.4 pg (ref 26.6–33.0)
MCHC: 32.4 g/dL (ref 31.5–35.7)
MCV: 91 fL (ref 79–97)
Monocytes Absolute: 0.8 10*3/uL (ref 0.1–0.9)
Monocytes: 14 %
NEUTROS PCT: 50 %
Neutrophils Absolute: 2.7 10*3/uL (ref 1.4–7.0)
PLATELETS: 323 10*3/uL (ref 150–379)
RBC: 4.12 x10E6/uL (ref 3.77–5.28)
RDW: 14 % (ref 12.3–15.4)
WBC: 5.4 10*3/uL (ref 3.4–10.8)

## 2016-12-29 LAB — RHEUMATOID ARTHRITIS PROFILE: CYCLIC CITRULLIN PEPTIDE AB: 4 U (ref 0–19)

## 2016-12-29 LAB — COXSACKIE B VIRUS ANTIBODIES
COXSACKIE B4 AB: NEGATIVE
Coxsackie B1 Ab: 1:8 {titer} — ABNORMAL HIGH
Coxsackie B2 Ab: 1:8 {titer} — ABNORMAL HIGH
Coxsackie B3 Ab: NEGATIVE
Coxsackie B5 Ab: 1:8 {titer} — ABNORMAL HIGH
Coxsackie B6 Ab: 1:32 {titer} — ABNORMAL HIGH

## 2016-12-29 LAB — COXSACKIE A VIRUS ANTIBODIES
COXSACKIE A7 IGM: NEGATIVE {titer}
Coxsackie A16 IgG: 1:400 {titer} — ABNORMAL HIGH
Coxsackie A16 IgM: NEGATIVE titer
Coxsackie A24 IgM: NEGATIVE titer
Coxsackie A7 IgG: 1:400 {titer} — ABNORMAL HIGH
Coxsackie A9 IgG: 1:400 {titer} — ABNORMAL HIGH
Coxsackie A9 IgM: NEGATIVE titer

## 2016-12-29 LAB — EPSTEIN-BARR VIRUS VCA ANTIBODY PANEL
EBV Early Antigen Ab, IgG: <9 U/mL (ref 0.0–8.9)
EBV NA IgG: 417 U/mL — ABNORMAL HIGH (ref 0.0–17.9)
EBV VCA IgG: >600 U/mL — ABNORMAL HIGH (ref 0.0–17.9)

## 2016-12-29 LAB — CMV ABS, IGG+IGM (CYTOMEGALOVIRUS)
CMV Ab - IgG: <0.6 U/mL (ref 0.00–0.59)
CMV IgM Ser EIA-aCnc: <30 AU/mL (ref 0.0–29.9)

## 2016-12-29 LAB — SEDIMENTATION RATE: SED RATE: 4 mm/h (ref 0–32)

## 2016-12-29 LAB — HIV ANTIBODY (ROUTINE TESTING W REFLEX): HIV Screen 4th Generation wRfx: NONREACTIVE

## 2016-12-29 LAB — HSV(HERPES SIMPLEX VRS) I + II AB-IGG
HSV 1 Glycoprotein G Ab, IgG: <0.91 index (ref 0.00–0.90)
HSV 2 Glycoprotein G Ab, IgG: <0.91 index (ref 0.00–0.90)

## 2017-01-06 ENCOUNTER — Other Ambulatory Visit: Payer: Self-pay | Admitting: Emergency Medicine

## 2017-01-06 DIAGNOSIS — K5909 Other constipation: Secondary | ICD-10-CM

## 2017-02-08 ENCOUNTER — Ambulatory Visit (INDEPENDENT_AMBULATORY_CARE_PROVIDER_SITE_OTHER): Payer: 59 | Admitting: Physician Assistant

## 2017-02-08 ENCOUNTER — Encounter: Payer: Self-pay | Admitting: Physician Assistant

## 2017-02-08 ENCOUNTER — Ambulatory Visit (INDEPENDENT_AMBULATORY_CARE_PROVIDER_SITE_OTHER): Payer: 59

## 2017-02-08 VITALS — BP 135/91 | HR 73 | Temp 98.3°F | Resp 16 | Wt 167.2 lb

## 2017-02-08 DIAGNOSIS — R109 Unspecified abdominal pain: Secondary | ICD-10-CM

## 2017-02-08 DIAGNOSIS — N3000 Acute cystitis without hematuria: Secondary | ICD-10-CM | POA: Diagnosis not present

## 2017-02-08 DIAGNOSIS — Z299 Encounter for prophylactic measures, unspecified: Secondary | ICD-10-CM | POA: Diagnosis not present

## 2017-02-08 DIAGNOSIS — E860 Dehydration: Secondary | ICD-10-CM

## 2017-02-08 DIAGNOSIS — R300 Dysuria: Secondary | ICD-10-CM | POA: Diagnosis not present

## 2017-02-08 LAB — POCT CBC
GRANULOCYTE PERCENT: 57.8 % (ref 37–80)
HEMATOCRIT: 34.4 % — AB (ref 37.7–47.9)
Hemoglobin: 11.5 g/dL — AB (ref 12.2–16.2)
LYMPH, POC: 2.7 (ref 0.6–3.4)
MCH, POC: 29.5 pg (ref 27–31.2)
MCHC: 33.4 g/dL (ref 31.8–35.4)
MCV: 88.2 fL (ref 80–97)
MID (CBC): 0.6 (ref 0–0.9)
MPV: 8.1 fL (ref 0–99.8)
PLATELET COUNT, POC: 337 10*3/uL (ref 142–424)
POC Granulocyte: 4.5 (ref 2–6.9)
POC LYMPH %: 34.5 % (ref 10–50)
POC MID %: 7.7 %M (ref 0–12)
RBC: 3.9 M/uL — AB (ref 4.04–5.48)
RDW, POC: 14.1 %
WBC: 7.8 10*3/uL (ref 4.6–10.2)

## 2017-02-08 LAB — POCT URINALYSIS DIP (MANUAL ENTRY)
BILIRUBIN UA: NEGATIVE mg/dL
Bilirubin, UA: NEGATIVE
GLUCOSE UA: NEGATIVE mg/dL
LEUKOCYTES UA: NEGATIVE
Nitrite, UA: NEGATIVE
Protein Ur, POC: 30 mg/dL — AB
Urobilinogen, UA: 0.2 E.U./dL
pH, UA: 6 (ref 5.0–8.0)

## 2017-02-08 LAB — POC MICROSCOPIC URINALYSIS (UMFC): MUCUS RE: ABSENT

## 2017-02-08 MED ORDER — CEPHALEXIN 750 MG PO CAPS
750.0000 mg | ORAL_CAPSULE | Freq: Two times a day (BID) | ORAL | 0 refills | Status: DC
Start: 2017-02-08 — End: 2017-02-10

## 2017-02-08 NOTE — Patient Instructions (Addendum)
Please complete antibiotics. I have no evidence to suspect that anything else is going on today.      IF you received an x-ray today, you will receive an invoice from [facility]. Please contact [facility] at [phone] with questions or concerns regarding your invoice.   IF you received labwork today, you will receive an invoice from [facility]. Please contact [facility] at [phone] with questions or concerns regarding your invoice.   Our billing staff will not be able to assist you with questions regarding bills from these companies.  You will be contacted with the lab results as soon as they are available. The fastest way to get your results is to activate your My Chart account. Instructions are located on the last page of this paperwork. If you have not heard from us regarding the results in 2 weeks, please contact this office.

## 2017-02-08 NOTE — Progress Notes (Signed)
02/10/2017 1:55 PM   DOB: [birth year] / MRN: [id number]  SUBJECTIVE:  Audrey Christian is a 46 y.o. female presenting for urinary frequency and pressure that started 3 days ago. Associates fever and chills. She has a history of nephrolithiasis and bladder reflux.  She has tried Keflex 500 bid for 2 days along with Azo and reports that this has helped. Denies stool changes and abnormal vaginal discharge.   She is allergic to erythromycin.   She  has a past medical history of Allergy; Alopecia areata (05/2002); Anemia; ANXIETY (06/03/2007); ASTHMA (06/03/2007); Bradycardia; Chronic kidney disease; and HYPOTHYROIDISM (06/03/2007).    She  reports that she has never smoked. She has never used smokeless tobacco. She reports that she does not drink alcohol or use drugs. She  reports that she currently engages in sexual activity. She reports using the following method of birth control/protection: None. The patient  has a past surgical history that includes Cholecystectomy (05/2002) and Tubal ligation.  Her family history includes Cancer in her father, maternal grandfather, maternal grandmother, paternal grandfather, and paternal grandmother; Hypertension in her mother; Hypothyroidism in her mother.  Review of Systems  Constitutional: Negative for chills and fever.  Gastrointestinal: Negative for nausea.  Skin: Negative for rash.  Neurological: Negative for dizziness.    The problem list and medications were reviewed and updated by myself where necessary and exist elsewhere in the encounter.   OBJECTIVE:  BP (!) 135/91 (Cuff Size: Normal)   Pulse 73   Temp 98.3 F (36.8 C) (Oral)   Resp 16   Wt 167 lb 3.2 oz (75.8 kg)   SpO2 97%   BMI 29.62 kg/m   Physical Exam  Constitutional: She is oriented to person, place, and time.  Cardiovascular: Normal rate, regular rhythm and normal heart sounds.   Pulmonary/Chest: Effort normal and breath sounds normal. She has no rales.  Musculoskeletal: Normal  range of motion.  Neurological: She is alert and oriented to person, place, and time.  Skin: Skin is warm and dry.    Results for orders placed or performed in visit on 02/08/17 (from the past 72 hour(s))  POCT urinalysis dipstick     Status: Abnormal   Collection Time: 02/08/17  4:55 PM  Result Value Ref Range   Color, UA yellow yellow   Clarity, UA clear clear   Glucose, UA negative negative mg/dL   Bilirubin, UA negative negative   Ketones, POC UA negative negative mg/dL   Spec Grav, UA >=4.098 (A) 1.010 - 1.025   Blood, UA large (A) negative   pH, UA 6.0 5.0 - 8.0   Protein Ur, POC =30 (A) negative mg/dL   Urobilinogen, UA 0.2 0.2 or 1.0 E.U./dL   Nitrite, UA Negative Negative   Leukocytes, UA Negative Negative  POCT Microscopic Urinalysis ([facility])     Status: Abnormal   Collection Time: 02/08/17  5:05 PM  Result Value Ref Range   WBC,UR,HPF,POC Few (A) None WBC/hpf   RBC,UR,HPF,POC Too numerous to count  (A) None RBC/hpf   Bacteria None None, Too numerous to count   Mucus Absent Absent   Epithelial Cells, UR Per Microscopy Few (A) None, Too numerous to count cells/hpf  Basic metabolic panel     Status: None   Collection Time: 02/08/17  5:27 PM  Result Value Ref Range   Glucose 87 65 - 99 mg/dL   BUN 14 6 - 24 mg/dL   Creatinine, Ser 1.19 0.57 - 1.00 mg/dL   GFR calc  non Af Amer 90 >59 mL/min/1.73   GFR calc Af Amer 104 >59 mL/min/1.73   BUN/Creatinine Ratio 18 9 - 23   Sodium 137 134 - 144 mmol/L   Potassium 4.0 3.5 - 5.2 mmol/L   Chloride 98 96 - 106 mmol/L   CO2 22 18 - 29 mmol/L   Calcium 8.8 8.7 - 10.2 mg/dL  POCT CBC     Status: Abnormal   Collection Time: 02/08/17  5:41 PM  Result Value Ref Range   WBC 7.8 4.6 - 10.2 K/uL   Lymph, poc 2.7 0.6 - 3.4   POC LYMPH PERCENT 34.5 10 - 50 %L   MID (cbc) 0.6 0 - 0.9   POC MID % 7.7 0 - 12 %M   POC Granulocyte 4.5 2 - 6.9   Granulocyte percent 57.8 37 - 80 %G   RBC 3.90 (A) 4.04 - 5.48 M/uL   Hemoglobin 11.5 (A)  12.2 - 16.2 g/dL   HCT, POC 40.934.4 (A) 81.137.7 - 47.9 %   MCV 88.2 80 - 97 fL   MCH, POC 29.5 27 - 31.2 pg   MCHC 33.4 31.8 - 35.4 g/dL   RDW, POC 91.414.1 %   Platelet Count, POC 337 142 - 424 K/uL   MPV 8.1 0 - 99.8 fL  Urine culture     Status: Abnormal   Collection Time: 02/08/17  5:54 PM  Result Value Ref Range   Urine Culture, Routine Final report (A)    Urine Culture result 1 Escherichia coli (A)     Comment: 10,000-25,000 colony forming units per mL Cefazolin with an MIC <=16 predicts susceptibility to the oral agents cefaclor, cefdinir, cefpodoxime, cefprozil, cefuroxime, cephalexin, and loracarbef when used for therapy of uncomplicated urinary tract infections due to E. coli, Klebsiella pneumoniae, and Proteus mirabilis.    ANTIMICROBIAL SUSCEPTIBILITY Comment     Comment:       ** S = Susceptible; I = Intermediate; R = Resistant **                    P = Positive; N = Negative             MICS are expressed in micrograms per mL    Antibiotic                 RSLT#1    RSLT#2    RSLT#3    RSLT#4 Amoxicillin/Clavulanic Acid    I Ampicillin                     R Cefazolin                      S Cefepime                       S Ceftriaxone                    S Cefuroxime                     S Cephalothin                    R Ciprofloxacin                  S Ertapenem                      S Gentamicin  S Imipenem                       S Levofloxacin                   S Nitrofurantoin                 S Piperacillin                   R Tetracycline                   S Tobramycin                     S Trimethoprim/Sulfa             R     No results found.  ASSESSMENT AND PLAN:  Audrey Christian was seen today for urinary tract infection.  Diagnoses and all orders for this visit:  Dysuria -     POCT urinalysis dipstick -     POCT Microscopic Urinalysis ([facility]) -     POCT CBC -     Urine culture  Flank pain -     DG Abd 2 Views; Future -     Basic metabolic  panel  Dehydration -     Insert peripheral IV -     Care order/instruction:  Need for prophylactic measure  Other orders -     Discontinue: cephALEXin (KEFLEX) 750 MG capsule; Take 1 capsule (750 mg total) by mouth 2 (two) times daily. -     ciprofloxacin (CIPRO) 500 MG tablet; Take 1 tablet (500 mg total) by mouth 2 (two) times daily. -     fluconazole (DIFLUCAN) 150 MG tablet; Take 1 tablet (150 mg total) by mouth once. Repeat if needed    The patient is advised to call or return to clinic if she does not see an improvement in symptoms, or to seek the care of the closest emergency department if she worsens with the above plan.   Deliah Boston, MHS, PA-C [facility] 02/10/2017 1:55 PM

## 2017-02-09 LAB — BASIC METABOLIC PANEL
BUN / CREAT RATIO: 18 (ref 9–23)
BUN: 14 mg/dL (ref 6–24)
CALCIUM: 8.8 mg/dL (ref 8.7–10.2)
CHLORIDE: 98 mmol/L (ref 96–106)
CO2: 22 mmol/L (ref 18–29)
CREATININE: 0.79 mg/dL (ref 0.57–1.00)
GFR calc Af Amer: 104 mL/min/{1.73_m2} (ref >59)
GFR calc non Af Amer: 90 mL/min/{1.73_m2} (ref >59)
GLUCOSE: 87 mg/dL (ref 65–99)
Potassium: 4 mmol/L (ref 3.5–5.2)
Sodium: 137 mmol/L (ref 134–144)

## 2017-02-10 LAB — URINE CULTURE

## 2017-02-10 MED ORDER — CIPROFLOXACIN HCL 500 MG PO TABS: 500.0000 mg | ORAL_TABLET | Freq: Two times a day (BID) | ORAL | 0 refills | Status: AC

## 2017-02-10 MED ORDER — FLUCONAZOLE 150 MG PO TABS: 150.0000 mg | ORAL_TABLET | Freq: Once | ORAL | 0 refills | Status: AC

## 2017-03-07 ENCOUNTER — Other Ambulatory Visit: Payer: Self-pay | Admitting: Endocrinology

## 2017-03-22 DIAGNOSIS — M76822 Posterior tibial tendinitis, left leg: Secondary | ICD-10-CM | POA: Diagnosis not present

## 2017-03-22 DIAGNOSIS — M25571 Pain in right ankle and joints of right foot: Secondary | ICD-10-CM | POA: Diagnosis not present

## 2017-04-04 DIAGNOSIS — L718 Other rosacea: Secondary | ICD-10-CM | POA: Diagnosis not present

## 2017-04-04 DIAGNOSIS — L659 Nonscarring hair loss, unspecified: Secondary | ICD-10-CM | POA: Diagnosis not present

## 2017-04-08 ENCOUNTER — Ambulatory Visit: Payer: 59 | Admitting: Endocrinology

## 2017-04-27 ENCOUNTER — Ambulatory Visit
Admission: RE | Admit: 2017-04-27 | Discharge: 2017-04-27 | Disposition: A | Discharge status: Home / Self Care | Payer: 59 | Source: Ambulatory Visit | Attending: Endocrinology | Admitting: Endocrinology

## 2017-04-27 ENCOUNTER — Encounter: Payer: Self-pay | Admitting: Endocrinology

## 2017-04-27 ENCOUNTER — Ambulatory Visit (INDEPENDENT_AMBULATORY_CARE_PROVIDER_SITE_OTHER): Payer: 59 | Admitting: Endocrinology

## 2017-04-27 VITALS — BP 132/88 | HR 68 | Wt 177.4 lb

## 2017-04-27 DIAGNOSIS — M79642 Pain in left hand: Secondary | ICD-10-CM | POA: Diagnosis not present

## 2017-04-27 DIAGNOSIS — Z0000 Encounter for general adult medical examination without abnormal findings: Secondary | ICD-10-CM | POA: Diagnosis not present

## 2017-04-27 DIAGNOSIS — M2550 Pain in unspecified joint: Secondary | ICD-10-CM | POA: Diagnosis not present

## 2017-04-27 LAB — URINALYSIS, ROUTINE W REFLEX MICROSCOPIC
Bilirubin Urine: NEGATIVE
Leukocytes, UA: NEGATIVE
Nitrite: NEGATIVE
Total Protein, Urine: NEGATIVE
URINE GLUCOSE: NEGATIVE
Urobilinogen, UA: 0.2 (ref 0.0–1.0)
pH: 6 (ref 5.0–8.0)

## 2017-04-27 LAB — BASIC METABOLIC PANEL
BUN: 15 mg/dL (ref 6–23)
CALCIUM: 9.4 mg/dL (ref 8.4–10.5)
CO2: 31 meq/L (ref 19–32)
CREATININE: 0.79 mg/dL (ref 0.40–1.20)
Chloride: 103 mEq/L (ref 96–112)
GFR: 83 mL/min (ref >60.00)
GLUCOSE: 90 mg/dL (ref 70–99)
Potassium: 3.7 mEq/L (ref 3.5–5.1)
SODIUM: 137 meq/L (ref 135–145)

## 2017-04-27 LAB — HEPATIC FUNCTION PANEL
ALBUMIN: 4.1 g/dL (ref 3.5–5.2)
ALT: 25 U/L (ref 0–35)
AST: 23 U/L (ref 0–37)
Alkaline Phosphatase: 47 U/L (ref 39–117)
Bilirubin, Direct: 0 mg/dL (ref 0.0–0.3)
Total Bilirubin: 0.2 mg/dL (ref 0.2–1.2)
Total Protein: 6.9 g/dL (ref 6.0–8.3)

## 2017-04-27 LAB — CBC WITH DIFFERENTIAL/PLATELET
BASOS ABS: 0.1 10*3/uL (ref 0.0–0.1)
BASOS PCT: 1.8 % (ref 0.0–3.0)
EOS ABS: 0.3 10*3/uL (ref 0.0–0.7)
Eosinophils Relative: 4.5 % (ref 0.0–5.0)
HCT: 38 % (ref 36.0–46.0)
HEMOGLOBIN: 12.7 g/dL (ref 12.0–15.0)
Lymphocytes Relative: 32.2 % (ref 12.0–46.0)
Lymphs Abs: 1.9 10*3/uL (ref 0.7–4.0)
MCHC: 33.4 g/dL (ref 30.0–36.0)
MCV: 90.4 fl (ref 78.0–100.0)
MONO ABS: 0.9 10*3/uL (ref 0.1–1.0)
Monocytes Relative: 16.2 % — ABNORMAL HIGH (ref 3.0–12.0)
Neutro Abs: 2.6 10*3/uL (ref 1.4–7.7)
Neutrophils Relative %: 45.3 % (ref 43.0–77.0)
Platelets: 264 10*3/uL (ref 150.0–400.0)
RBC: 4.2 Mil/uL (ref 3.87–5.11)
RDW: 15 % (ref 11.5–15.5)
WBC: 5.8 10*3/uL (ref 4.0–10.5)

## 2017-04-27 LAB — TSH: TSH: 2.04 u[IU]/mL (ref 0.35–4.50)

## 2017-04-27 LAB — LIPID PANEL
CHOLESTEROL: 202 mg/dL — AB (ref 0–200)
HDL: 62.1 mg/dL (ref >39.00)
NonHDL: 140.07
Total CHOL/HDL Ratio: 3
Triglycerides: 247 mg/dL — ABNORMAL HIGH (ref 0.0–149.0)
VLDL: 49.4 mg/dL — AB (ref 0.0–40.0)

## 2017-04-27 LAB — SEDIMENTATION RATE: Sed Rate: 12 mm/hr (ref 0–20)

## 2017-04-27 LAB — LDL CHOLESTEROL, DIRECT: LDL DIRECT: 113 mg/dL

## 2017-04-27 NOTE — Progress Notes (Signed)
Subjective:    Patient ID: Audrey Christian, female    DOB: [birth year], 46 y.o.   MRN: [id number]  HPI Pt is here for regular wellness examination; she says chronic med probs are stable, except as noted below She has arthralgias of the left hand.  Past Medical History:  Diagnosis Date  . Allergy   . Alopecia areata 05/2002  . Anemia   . ANXIETY 06/03/2007  . ASTHMA 06/03/2007  . Bradycardia    Asymptomatic  . Chronic kidney disease   . HYPOTHYROIDISM 06/03/2007    Past Surgical History:  Procedure Laterality Date  . CHOLECYSTECTOMY  05/2002  . TUBAL LIGATION      Social History   Social History  . Marital status: Married    Spouse name: N/A  . Number of children: N/A  . Years of education: N/A   Occupational History  .  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 Smoking status: Never Smoker  . Smokeless tobacco: Never Used  . Alcohol use No  . Drug use: No  . Sexual activity: Yes    Birth control/ protection: None   Other Topics Concern  . Not on file   Social History Narrative  . No narrative on file    Current Outpatient Prescriptions on File Prior to Visit  Medication Sig Dispense Refill  . Calcium Carbonate-Vitamin D (CALTRATE 600+D) 600-400 MG-UNIT per tablet Take 2 tablets by mouth daily.     . clobetasol cream (TEMOVATE) 0.05 %     . docusate sodium (COLACE) 100 MG capsule Take 3 capsules (300 mg total) by mouth daily. 90 capsule 11  . fexofenadine (ALLEGRA ALLERGY) 60 MG tablet Take 60 mg by mouth 2 (two) times daily.    . fluticasone (FLONASE) 50 MCG/ACT nasal spray Place into both nostrils daily.    Marland Kitchen. levothyroxine (SYNTHROID, LEVOTHROID) 75 MCG tablet TAKE 1 TABLET BY MOUTH ONCE DAILY BEFORE BREAKFAST 90 tablet 0  . Multiple Vitamin (MULTIVITAMIN) tablet Take 1 tablet by mouth daily.      . polyethylene glycol powder (GLYCOLAX/MIRALAX) powder TAKE 17 GRAMS BY MOUTH DAILY 527 g 0  . Liniments (VICKS BABYRUB EX) Apply  topically.    . SRONYX 0.1-20 MG-MCG tablet TAKE 1 TABLET(S) EVERY DAY BY ORAL ROUTE.  3   No current facility-administered medications on file prior to visit.     Allergies  Allergen Reactions  . Erythromycin     REACTION: Upset Stomach    Family History  Problem Relation Age of Onset  . Hypothyroidism Mother   . Hypertension Mother   . Cancer Father   . Cancer Maternal Grandmother   . Cancer Maternal Grandfather   . Cancer Paternal Grandmother   . Cancer Paternal Grandfather     BP 132/88   Pulse 68   Wt 177 lb 6.4 oz (80.5 kg)   SpO2 98%   BMI 31.42 kg/m    Review of Systems  Constitutional: Negative for fever.  HENT: Negative for hearing loss.   Eyes: Negative for visual disturbance.  Respiratory: Negative for shortness of breath.   Cardiovascular: Negative for chest pain.  Gastrointestinal: Negative for blood in stool.  Endocrine: Negative for cold intolerance.  Genitourinary: Negative for hematuria.  Musculoskeletal: Negative for back pain.  Skin: Negative for wound.  Allergic/Immunologic:       Allergic rhinitis is well-controlled   Neurological: Negative for syncope and numbness.  Hematological: Bruises/bleeds easily.  Psychiatric/Behavioral: Negative for dysphoric mood.      Objective:   Physical Exam VS: see vs page GEN: no distress HEAD: head: no deformity eyes: no periorbital swelling, no proptosis external nose and ears are normal mouth: no lesion seen NECK: supple, thyroid is not enlarged.   CHEST WALL: no deformity LUNGS:  Clear to auscultation BREASTS: sees gyn CV: reg rate and rhythm, no murmur ABD: abdomen is soft, nontender.  no hepatosplenomegaly.  not distended.  no hernia.   GENITALIA/RECTAL: sees gyn.   MUSCULOSKELETAL: muscle bulk and strength are grossly normal.  no obvious joint swelling.  gait is normal and steady.   EXTEMITIES: no deformity.  no ulcer on the feet.  feet are of normal color and temp.  no edema PULSES:  dorsalis pedis intact bilat.  no carotid bruit NEURO:  cn 2-12 grossly intact.   readily moves all 4's.  sensation is intact to touch on the feet SKIN:  Normal texture and temperature.  No rash or suspicious lesion is visible.   NODES:  None palpable at the neck.   PSYCH: alert, well-oriented.  Does not appear anxious nor depressed.    ecg is declined     Assessment & Plan:  Wellness visit today, with problems stable, except as noted. Arthralgias, new  Patient Instructions  Please consider these measures for your health:  minimize alcohol.  Do not use tobacco products.  Have a colonoscopy at least every 10 years from age 47.  Women should have an annual mammogram from age 47.  Keep firearms safely stored.  Always use seat belts.  have working smoke alarms in your home.  See an eye doctor and dentist regularly.  Never drive under the influence of alcohol or drugs (including prescription drugs).  Those with fair skin should take precautions against the sun, and should carefully examine their skin once per month, for any new or changed moles. blood tests and x-rays are requested for you today.  We'll let you know about the results.  Please return in 1 year.

## 2017-04-27 NOTE — Patient Instructions (Addendum)
Please consider these measures for your health:  minimize alcohol.  Do not use tobacco products.  Have a colonoscopy at least every 10 years from age 47.  Women should have an annual mammogram from age 47.  Keep firearms safely stored.  Always use seat belts.  have working smoke alarms in your home.  See an eye doctor and dentist regularly.  Never drive under the influence of alcohol or drugs (including prescription drugs).  Those with fair skin should take precautions against the sun, and should carefully examine their skin once per month, for any new or changed moles. blood tests and x-rays are requested for you today.  We'll let you know about the results.  Please return in 1 year.

## 2017-04-28 LAB — RHEUMATOID FACTOR

## 2017-04-29 LAB — ANA: ANA: NEGATIVE

## 2017-06-02 ENCOUNTER — Other Ambulatory Visit: Payer: Self-pay | Admitting: Endocrinology

## 2017-06-03 ENCOUNTER — Telehealth: Payer: 59 | Admitting: Family

## 2017-06-03 DIAGNOSIS — B9689 Other specified bacterial agents as the cause of diseases classified elsewhere: Secondary | ICD-10-CM

## 2017-06-03 DIAGNOSIS — J329 Chronic sinusitis, unspecified: Secondary | ICD-10-CM

## 2017-06-03 MED ORDER — AMOXICILLIN-POT CLAVULANATE 875-125 MG PO TABS: 1.0000 | ORAL_TABLET | Freq: Two times a day (BID) | ORAL | 0 refills | Status: AC

## 2017-06-03 NOTE — Progress Notes (Signed)
Thank you for the details you put in the comment boxes. Those details really help us take better care of you.   We are sorry that you are not feeling well.  Here is how we plan to help!  Based on what you have shared with me it looks like you have sinusitis.  Sinusitis is inflammation and infection in the sinus cavities of the head.  Based on your presentation I believe you most likely have Acute Bacterial Sinusitis.  This is an infection caused by bacteria and is treated with antibiotics. I have prescribed Augmentin 875mg/125mg one tablet twice daily with food, for 7 days. You may use an oral decongestant such as Mucinex D or if you have glaucoma or high blood pressure use plain Mucinex. Saline nasal spray help and can safely be used as often as needed for congestion.  If you develop worsening sinus pain, fever or notice severe headache and vision changes, or if symptoms are not better after completion of antibiotic, please schedule an appointment with a health care provider.    Sinus infections are not as easily transmitted as other respiratory infection, however we still recommend that you avoid close contact with loved ones, especially the very young and elderly.  Remember to wash your hands thoroughly throughout the day as this is the number one way to prevent the spread of infection!  Home Care:  Only take medications as instructed by your medical team.  Complete the entire course of an antibiotic.  Do not take these medications with alcohol.  A steam or ultrasonic humidifier can help congestion.  You can place a towel over your head and breathe in the steam from hot water coming from a faucet.  Avoid close contacts especially the very young and the elderly.  Cover your mouth when you cough or sneeze.  Always remember to wash your hands.  Get Help Right Away If:  You develop worsening fever or sinus pain.  You develop a severe head ache or visual changes.  Your symptoms persist  after you have completed your treatment plan.  Make sure you  Understand these instructions.  Will watch your condition.  Will get help right away if you are not doing well or get worse.  Your e-visit answers were reviewed by a board certified advanced clinical practitioner to complete your personal care plan.  Depending on the condition, your plan could have included both over the counter or prescription medications.  If there is a problem please reply  once you have received a response from your provider.  Your safety is important to us.  If you have drug allergies check your prescription carefully.    You can use MyChart to ask questions about today's visit, request a non-urgent call back, or ask for a work or school excuse for 24 hours related to this e-Visit. If it has been greater than 24 hours you will need to follow up with your provider, or enter a new e-Visit to address those concerns.  You will get an e-mail in the next two days asking about your experience.  I hope that your e-visit has been valuable and will speed your recovery. Thank you for using e-visits.    

## 2017-06-09 ENCOUNTER — Encounter: Payer: Self-pay | Admitting: Urgent Care

## 2017-06-09 ENCOUNTER — Ambulatory Visit (INDEPENDENT_AMBULATORY_CARE_PROVIDER_SITE_OTHER): Payer: 59 | Admitting: Urgent Care

## 2017-06-09 VITALS — BP 108/72 | HR 88 | Temp 98.9°F | Resp 16 | Ht 62.5 in | Wt 176.0 lb

## 2017-06-09 DIAGNOSIS — J3489 Other specified disorders of nose and nasal sinuses: Secondary | ICD-10-CM | POA: Diagnosis not present

## 2017-06-09 DIAGNOSIS — J019 Acute sinusitis, unspecified: Secondary | ICD-10-CM

## 2017-06-09 DIAGNOSIS — J3089 Other allergic rhinitis: Secondary | ICD-10-CM | POA: Diagnosis not present

## 2017-06-09 MED ORDER — TETRACYCLINE HCL 500 MG PO CAPS: 500.0000 mg | ORAL_CAPSULE | Freq: Two times a day (BID) | ORAL | 0 refills | Status: DC

## 2017-06-09 MED ORDER — DOXYCYCLINE HYCLATE 100 MG PO CAPS: 100.0000 mg | ORAL_CAPSULE | Freq: Two times a day (BID) | ORAL | 0 refills | Status: DC

## 2017-06-09 MED ORDER — METHYLPREDNISOLONE ACETATE 80 MG/ML IJ SUSP
80.0000 mg | Freq: Once | INTRAMUSCULAR | Status: AC
  Administered 2017-06-09: 80 mg via INTRAMUSCULAR

## 2017-06-09 MED ORDER — PSEUDOEPHEDRINE HCL ER 120 MG PO TB12: 120.0000 mg | ORAL_TABLET | Freq: Two times a day (BID) | ORAL | 1 refills | Status: DC

## 2017-06-09 NOTE — Patient Instructions (Signed)
Sinusitis, Adult Sinusitis is soreness and inflammation of your sinuses. Sinuses are hollow spaces in the bones around your face. Your sinuses are located:  Around your eyes.  In the middle of your forehead.  Behind your nose.  In your cheekbones.  Your sinuses and nasal passages are lined with a stringy fluid (mucus). Mucus normally drains out of your sinuses. When your nasal tissues become inflamed or swollen, the mucus can become trapped or blocked so air cannot flow through your sinuses. This allows bacteria, viruses, and funguses to grow, which leads to infection. Sinusitis can develop quickly and last for 7?10 days (acute) or for more than 12 weeks (chronic). Sinusitis often develops after a cold. What are the causes? This condition is caused by anything that creates swelling in the sinuses or stops mucus from draining, including:  Allergies.  Asthma.  Bacterial or viral infection.  Abnormally shaped bones between the nasal passages.  Nasal growths that contain mucus (nasal polyps).  Narrow sinus openings.  Pollutants, such as chemicals or irritants in the air.  A foreign object stuck in the nose.  A fungal infection. This is rare.  What increases the risk? The following factors may make you more likely to develop this condition:  Having allergies or asthma.  Having had a recent cold or respiratory tract infection.  Having structural deformities or blockages in your nose or sinuses.  Having a weak immune system.  Doing a lot of swimming or diving.  Overusing nasal sprays.  Smoking.  What are the signs or symptoms? The main symptoms of this condition are pain and a feeling of pressure around the affected sinuses. Other symptoms include:  Upper toothache.  Earache.  Headache.  Bad breath.  Decreased sense of smell and taste.  A cough that may get worse at night.  Fatigue.  Fever.  Thick drainage from your nose. The drainage is often green and  it may contain pus (purulent).  Stuffy nose or congestion.  Postnasal drip. This is when extra mucus collects in the throat or back of the nose.  Swelling and warmth over the affected sinuses.  Sore throat.  Sensitivity to light.  How is this diagnosed? This condition is diagnosed based on symptoms, a medical history, and a physical exam. To find out if your condition is acute or chronic, your health care provider may:  Look in your nose for signs of nasal polyps.  Tap over the affected sinus to check for signs of infection.  View the inside of your sinuses using an imaging device that has a light attached (endoscope).  If your health care provider suspects that you have chronic sinusitis, you may also:  Be tested for allergies.  Have a sample of mucus taken from your nose (nasal culture) and checked for bacteria.  Have a mucus sample examined to see if your sinusitis is related to an allergy.  If your sinusitis does not respond to treatment and it lasts longer than 8 weeks, you may have an MRI or CT scan to check your sinuses. These scans also help to determine how severe your infection is. In rare cases, a bone biopsy may be done to rule out more serious types of fungal sinus disease. How is this treated? Treatment for sinusitis depends on the cause and whether your condition is chronic or acute. If a virus is causing your sinusitis, your symptoms will go away on their own within 10 days. You may be given medicines to relieve your symptoms,   including:  Topical nasal decongestants. They shrink swollen nasal passages and let mucus drain from your sinuses.  Antihistamines. These drugs block inflammation that is triggered by allergies. This can help to ease swelling in your nose and sinuses.  Topical nasal corticosteroids. These are nasal sprays that ease inflammation and swelling in your nose and sinuses.  Nasal saline washes. These rinses can help to get rid of thick mucus in  your nose.  If your condition is caused by bacteria, you will be given an antibiotic medicine. If your condition is caused by a fungus, you will be given an antifungal medicine. Surgery may be needed to correct underlying conditions, such as narrow nasal passages. Surgery may also be needed to remove polyps. Follow these instructions at home: Medicines  Take, use, or apply over-the-counter and prescription medicines only as told by your health care provider. These may include nasal sprays.  If you were prescribed an antibiotic medicine, take it as told by your health care provider. Do not stop taking the antibiotic even if you start to feel better. Hydrate and Humidify  Drink enough water to keep your urine clear or pale yellow. Staying hydrated will help to thin your mucus.  Use a cool mist humidifier to keep the humidity level in your home above 50%.  Inhale steam for 10-15 minutes, 3-4 times a day or as told by your health care provider. You can do this in the bathroom while a hot shower is running.  Limit your exposure to cool or dry air. Rest  Rest as much as possible.  Sleep with your head raised (elevated).  Make sure to get enough sleep each night. General instructions  Apply a warm, moist washcloth to your face 3-4 times a day or as told by your health care provider. This will help with discomfort.  Wash your hands often with soap and water to reduce your exposure to viruses and other germs. If soap and water are not available, use hand sanitizer.  Do not smoke. Avoid being around people who are smoking (secondhand smoke).  Keep all follow-up visits as told by your health care provider. This is important. Contact a health care provider if:  You have a fever.  Your symptoms get worse.  Your symptoms do not improve within 10 days. Get help right away if:  You have a severe headache.  You have persistent vomiting.  You have pain or swelling around your face or  eyes.  You have vision problems.  You develop confusion.  Your neck is stiff.  You have trouble breathing. This information is not intended to replace advice given to you by your health care provider. Make sure you discuss any questions you have with your health care provider. Document Released: 09/20/2005 Document Revised: 05/16/2016 Document Reviewed: 07/16/2015 Elsevier Interactive Patient Education  2017 [place].     Doxycycline tablets or capsules What is this medicine? DOXYCYCLINE (dox i SYE kleen) is a tetracycline antibiotic. It kills certain bacteria or stops their growth. It is used to treat many kinds of infections, like dental, skin, respiratory, and urinary tract infections. It also treats acne, Lyme disease, malaria, and certain sexually transmitted infections. This medicine may be used for other purposes; ask your health care provider or pharmacist if you have questions. COMMON BRAND NAME(S): Acticlate, Adoxa, Adoxa CK, Adoxa Pak, Adoxa TT, Alodox, Avidoxy, Doxal, Mondoxyne NL, Monodox, Morgidox 1x, Morgidox 1x Kit, Morgidox 2x, Morgidox 2x Kit, NutriDox, Ocudox, TARGADOX, Vibra-Tabs, Vibramycin What should I  tell my health care provider before I take this medicine? They need to know if you have any of these conditions: -liver disease -long exposure to sunlight like working outdoors -stomach problems like colitis -an unusual or allergic reaction to doxycycline, tetracycline antibiotics, other medicines, foods, dyes, or preservatives -pregnant or trying to get pregnant -breast-feeding How should I use this medicine? Take this medicine by mouth with a full glass of water. Follow the directions on the prescription label. It is best to take this medicine without food, but if it upsets your stomach take it with food. Take your medicine at regular intervals. Do not take your medicine more often than directed. Take all of your medicine as directed even if you think you are  better. Do not skip doses or stop your medicine early. Talk to your pediatrician regarding the use of this medicine in children. While this drug may be prescribed for selected conditions, precautions do apply. Overdosage: If you think you have taken too much of this medicine contact a poison control center or emergency room at once. NOTE: This medicine is only for you. Do not share this medicine with others. What if I miss a dose? If you miss a dose, take it as soon as you can. If it is almost time for your next dose, take only that dose. Do not take double or extra doses. What may interact with this medicine? -antacids -barbiturates -birth control pills -bismuth subsalicylate -carbamazepine -methoxyflurane -other antibiotics -phenytoin -vitamins that contain iron -warfarin This list may not describe all possible interactions. Give your health care provider a list of all the medicines, herbs, non-prescription drugs, or dietary supplements you use. Also tell them if you smoke, drink alcohol, or use illegal drugs. Some items may interact with your medicine. What should I watch for while using this medicine? Tell your doctor or health care professional if your symptoms do not improve. Do not treat diarrhea with over the counter products. Contact your doctor if you have diarrhea that lasts more than 2 days or if it is severe and watery. Do not take this medicine just before going to bed. It may not dissolve properly when you lay down and can cause pain in your throat. Drink plenty of fluids while taking this medicine to also help reduce irritation in your throat. This medicine can make you more sensitive to the sun. Keep out of the sun. If you cannot avoid being in the sun, wear protective clothing and use sunscreen. Do not use sun lamps or tanning beds/booths. Birth control pills may not work properly while you are taking this medicine. Talk to your doctor about using an extra method of birth  control. If you are being treated for a sexually transmitted infection, avoid sexual contact until you have finished your treatment. Your sexual partner may also need treatment. Avoid antacids, aluminum, calcium, magnesium, and iron products for 4 hours before and 2 hours after taking a dose of this medicine. If you are using this medicine to prevent malaria, you should still protect yourself from contact with mosquitos. Stay in screened-in areas, use mosquito nets, keep your body covered, and use an insect repellent. What side effects may I notice from receiving this medicine? Side effects that you should report to your doctor or health care professional as soon as possible: -allergic reactions like skin rash, itching or hives, swelling of the face, lips, or tongue -difficulty breathing -fever -itching in the rectal or genital area -pain on swallowing -redness, blistering,  peeling or loosening of the skin, including inside the mouth -severe stomach pain or cramps -unusual bleeding or bruising -unusually weak or tired -yellowing of the eyes or skin Side effects that usually do not require medical attention (report to your doctor or health care professional if they continue or are bothersome): -diarrhea -loss of appetite -nausea, vomiting This list may not describe all possible side effects. Call your doctor for medical advice about side effects. You may report side effects to FDA at 1-800-FDA-1088. Where should I keep my medicine? Keep out of the reach of children. Store at room temperature, below 30 degrees C (86 degrees F). Protect from light. Keep container tightly closed. Throw away any unused medicine after the expiration date. Taking this medicine after the expiration date can make you seriously ill. NOTE: This sheet is a summary. It may not cover all possible information. If you have questions about this medicine, talk to your doctor, pharmacist, or health care provider.  2018  Elsevier/Gold Standard (2015-10-22 17:11:22)

## 2017-06-09 NOTE — Progress Notes (Signed)
   MRN: [id number] DOB: [birth year]  Subjective:   Audrey Christian is a 46 y.o. female presenting for chief complaint of face hurts (e-visit x 1 wk ago tomorrow. treated w/Augmentin 875-125) and Cough (dry cough, "little drip down back of throat")  Reports 3 week history of sinus pain, sinus pressure, fever (highest was 100F), mild and intermittent left ear pain, sore throat (now improved), mild dry cough. Denies ear drainage, chest pain, shob, wheezing, n/v, abdominal pain, rashes. Patient did have an e-visit and was prescribed Augmentin for 1 week, she will finish this course tomorrow. Admits some improvement but her sinus pain is still persisting. Denies smoking cigarettes.   Bonita QuinLinda has a current medication list which includes the following prescription(s): amoxicillin-clavulanate, calcium carbonate-vitamin d, clobetasol cream, docusate sodium, fluticasone, levothyroxine, multivitamin, polyethylene glycol powder, fexofenadine, liniments, and sronyx. Also is allergic to erythromycin.  Bonita QuinLinda  has a past medical history of Allergy; Alopecia areata (05/2002); Anemia; ANXIETY (06/03/2007); ASTHMA (06/03/2007); Bradycardia; Chronic kidney disease; and HYPOTHYROIDISM (06/03/2007). Also  has a past surgical history that includes Cholecystectomy (05/2002) and Tubal ligation.  Objective:   Vitals: BP 108/72   Pulse 88   Temp 98.9 F (37.2 C) (Oral)   Resp 16   Ht 5' 2.5" (1.588 m)   Wt 176 lb (79.8 kg)   LMP 06/08/2017   SpO2 97%   BMI 31.68 kg/m   Physical Exam  Constitutional: She is oriented to person, place, and time. She appears well-developed and well-nourished.  HENT:  TM's intact bilaterally, no effusions or erythema. No tragus tenderness. Nasal turbinates erythematous and dry, nasal passages patent. Bilateral maxillary sinus tenderness. Oropharynx with thick streaks of post-nasal drainage but no tonsillar exudates, mucous membranes moist.  Eyes: No scleral icterus.  Neck: Normal range  of motion. Neck supple.  Cardiovascular: Normal rate, regular rhythm and intact distal pulses.  Exam reveals no gallop and no friction rub.   No murmur heard. Pulmonary/Chest: No respiratory distress. She has no wheezes. She has no rales.  Lymphadenopathy:    She has no cervical adenopathy.  Neurological: She is alert and oriented to person, place, and time.  Skin: Skin is warm and dry.  Psychiatric: She has a normal mood and affect.   Assessment and Plan :   1. Acute sinusitis, recurrence not specified, unspecified location 2. Sinus pain - Doxycycline not covered by her insurance, will use tetracycline instead. If this is also not covered patient, will monitor symptoms to see if Depo-Medrol doesn't resolve her symptoms. Otherwise, consider additional days with Augmentin to try to help her symptoms.  3. Allergic rhinitis due to other allergic trigger, unspecified seasonality - Maintain Allegra, patient is to double check and make sure she has Sudafed. She will hold her Flonase until she has resolution of her symptoms. - methylPREDNISolone acetate (DEPO-MEDROL) injection 80 mg; Inject 1 mL (80 mg total) into the muscle once.   Wallis BambergMario Tameka Hoiland, PA-C [facility] [phone] 06/09/2017  5:52 PM

## 2017-06-22 DIAGNOSIS — Z01419 Encounter for gynecological examination (general) (routine) without abnormal findings: Secondary | ICD-10-CM | POA: Diagnosis not present

## 2017-06-22 DIAGNOSIS — Z1231 Encounter for screening mammogram for malignant neoplasm of breast: Secondary | ICD-10-CM | POA: Diagnosis not present

## 2017-08-29 ENCOUNTER — Other Ambulatory Visit: Payer: Self-pay | Admitting: Endocrinology

## 2017-09-30 ENCOUNTER — Ambulatory Visit: Payer: 59 | Admitting: Physician Assistant

## 2017-09-30 VITALS — BP 124/86 | HR 77 | Temp 98.4°F | Resp 16 | Ht 62.0 in | Wt 182.0 lb

## 2017-09-30 DIAGNOSIS — B3789 Other sites of candidiasis: Secondary | ICD-10-CM | POA: Diagnosis not present

## 2017-09-30 MED ORDER — FLUCONAZOLE 150 MG PO TABS: 150.0000 mg | ORAL_TABLET | ORAL | 0 refills | Status: DC

## 2017-09-30 MED ORDER — NYSTATIN-TRIAMCINOLONE 100000-0.1 UNIT/GM-% EX CREA: 1.0000 "application " | TOPICAL_CREAM | Freq: Two times a day (BID) | CUTANEOUS | 0 refills | Status: DC

## 2017-09-30 NOTE — Progress Notes (Signed)
[facility] [address], [place] [phone]  Date:  09/30/2017   Name:  Audrey Christian   DOB:  12-Sep-1971   MRN:  [id number]  PCP:  Patient, No Pcp Per    History of Present Illness:  Audrey Christian is a 46 y.o. female patient who presents to PCP with  Chief Complaint  Patient presents with  . Rash    under breast and  stomach/ itching. x 3 wks     Patient reports 3 weeks of rash at groin and under her breast.  It is pruritic and gives off an odd odor.   She has tried diaper rash cream, vaginal yeast cream, lotions, vaseline, and fluocinolone.  Fluocinolone has been the only thing productive with gold bond-like medicated topical powder.  She has had no broken skin. No hsitory of issues with glucose or diabetes. She wears a G-cup.  She has never had this before.    Patient Active Problem List   Diagnosis Date Noted  . Arthralgia 04/27/2017  . Polyp, corpus uteri 06/12/2015  . Multinodular goiter 04/01/2015  . Iron deficiency anemia 12/13/2012  . Routine general medical examination at a health care facility 12/13/2012  . Hypothyroidism 06/03/2007  . ALLERGIC RHINITIS 06/03/2007  . ASTHMA 06/03/2007    Past Medical History:  Diagnosis Date  . Allergy   . Alopecia areata 05/2002  . Anemia   . ANXIETY 06/03/2007  . ASTHMA 06/03/2007  . Bradycardia    Asymptomatic  . Chronic kidney disease   . HYPOTHYROIDISM 06/03/2007    Past Surgical History:  Procedure Laterality Date  . CHOLECYSTECTOMY  05/2002  . TUBAL LIGATION      Social History   Tobacco Use  . Smoking status: Never Smoker  . Smokeless tobacco: Never Used  Substance Use Topics  . Alcohol use: No  . Drug use: No    Family History  Problem Relation Age of Onset  . Hypothyroidism Mother   . Hypertension Mother   . Cancer Father   . Cancer Maternal Grandmother   . Cancer Maternal Grandfather   . Cancer Paternal Grandmother   . Cancer Paternal Grandfather      Allergies  Allergen Reactions  . Erythromycin     REACTION: Upset Stomach    Medication list has been reviewed and updated.  Current Outpatient Medications on File Prior to Visit  Medication Sig Dispense Refill  . Calcium Carbonate-Vitamin D (CALTRATE 600+D) 600-400 MG-UNIT per tablet Take 2 tablets by mouth daily.     . clobetasol cream (TEMOVATE) 0.05 %     . docusate sodium (COLACE) 100 MG capsule Take 3 capsules (300 mg total) by mouth daily. 90 capsule 11  . levothyroxine (SYNTHROID, LEVOTHROID) 75 MCG tablet TAKE 1 TABLET BY MOUTH ONCE DAILY BEFORE BREAKFAST 90 tablet 0  . Liniments (VICKS BABYRUB EX) Apply topically.    . Multiple Vitamin (MULTIVITAMIN) tablet Take 1 tablet by mouth daily.      . polyethylene glycol powder (GLYCOLAX/MIRALAX) powder TAKE 17 GRAMS BY MOUTH DAILY 527 g 0  . tetracycline (ACHROMYCIN,SUMYCIN) 500 MG capsule Take 1 capsule (500 mg total) by mouth 2 (two) times daily. 20 capsule 0   No current facility-administered medications on file prior to visit.     ROS ROS otherwise unremarkable unless listed above.  Physical Examination: BP 124/86   Pulse 77   Temp 98.4 F (36.9 C) (Oral)   Resp 16   Ht 5\' 2"  (  1.575 m)   Wt 182 lb (82.6 kg)   SpO2 99%   BMI 33.29 kg/m  Ideal Body Weight: Weight in (lb) to have BMI = 25: 136.4  Physical Exam  Constitutional: She is oriented to person, place, and time. She appears well-developed and well-nourished. No distress.  HENT:  Head: Normocephalic and atraumatic.  Right Ear: External ear normal.  Left Ear: External ear normal.  Eyes: Conjunctivae and EOM are normal. Pupils are equal, round, and reactive to light.  Cardiovascular: Normal rate.  Pulmonary/Chest: Effort normal. No respiratory distress.  Neurological: She is alert and oriented to person, place, and time.  Skin: She is not diaphoretic.  Mildly erythematous non-raised patch along the fold of the breast line bilaterally.   This is also at  the suprapubic area where the abdomen folds over.  Non-erythematous.  Psychiatric: She has a normal mood and affect. Her behavior is normal.     Assessment and Plan: Audrey Christian is a 46 y.o. female who is here today for cc of  Chief Complaint  Patient presents with  . Rash    under breast and  stomach/ itching. x 3 wks   Candidiasis of breast - Plan: fluconazole (DIFLUCAN) 150 MG tablet, nystatin-triamcinolone (MYCOLOG II) cream  Trena PlattStephanie Madalee Altmann, PA-C [facility] 12/30/20184:19 PM

## 2017-09-30 NOTE — Patient Instructions (Addendum)
You will take this once weekly for 3 doses.  Let's follow up in that time.  I am also giving you a fungal cream to place under the breast.   Rash A rash is a change in the color of the skin. A rash can also change the way your skin feels. There are many different conditions and factors that can cause a rash. Follow these instructions at home: Pay attention to any changes in your symptoms. Follow these instructions to help with your condition: Medicine Take or apply over-the-counter and prescription medicines only as told by your doctor. These may include:  Corticosteroid cream.  Anti-itch lotions.  Oral antihistamines.  Skin Care  Put cool compresses on the affected areas.  Try taking a bath with: ? Epsom salts. Follow the instructions on the packaging. You can get these at your local pharmacy or grocery store. ? Baking soda. Pour a small amount into the bath as told by your doctor. ? Colloidal oatmeal. Follow the instructions on the packaging. You can get this at your local pharmacy or grocery store.  Try putting baking soda paste onto your skin. Stir water into baking soda until it gets like a paste.  Do not scratch or rub your skin.  Avoid covering the rash. Make sure the rash is exposed to air as much as possible. General instructions  Avoid hot showers or baths, which can make itching worse. A cold shower may help.  Avoid scented soaps, detergents, and perfumes. Use gentle soaps, detergents, perfumes, and other cosmetic products.  Avoid anything that causes your rash. Keep a journal to help track what causes your rash. Write down: ? What you eat. ? What cosmetic products you use. ? What you drink. ? What you wear. This includes jewelry.  Keep all follow-up visits as told by your doctor. This is important. Contact a doctor if:  You sweat at night.  You lose weight.  You pee (urinate) more than normal.  You feel weak.  You throw up (vomit).  Your skin or the  whites of your eyes look yellow (jaundice).  Your skin: ? Tingles. ? Is numb.  Your rash: ? Does not go away after a few days. ? Gets worse.  You are: ? More thirsty than normal. ? More tired than normal.  You have: ? New symptoms. ? Pain in your belly (abdomen). ? A fever. ? Watery poop (diarrhea). Get help right away if:  Your rash covers all or most of your body. The rash may or may not be painful.  You have blisters that: ? Are on top of the rash. ? Grow larger. ? Grow together. ? Are painful. ? Are inside your nose or mouth.  You have a rash that: ? Looks like purple pinprick-sized spots all over your body. ? Has a "bull's eye" or looks like a target. ? Is red and painful, causes your skin to peel, and is not from being in the sun too long. This information is not intended to replace advice given to you by your health care provider. Make sure you discuss any questions you have with your health care provider. Document Released: 03/08/2008 Document Revised: 02/26/2016 Document Reviewed: 02/05/2015 Elsevier Interactive Patient Education  2018 [employer]    IF you received an x-ray today, you will receive an invoice from [facility]. Please contact [facility] at [phone] with questions or concerns regarding your invoice.   IF you received labwork today, you will receive an invoice from  [facility]. Please contact [facility] at [phone] with questions or concerns regarding your invoice.   Our billing staff will not be able to assist you with questions regarding bills from these companies.  You will be contacted with the lab results as soon as they are available. The fastest way to get your results is to activate your My Chart account. Instructions are located on the last page of this paperwork. If you have not heard from [place] regarding the results in 2 weeks, please contact this office.

## 2017-10-02 ENCOUNTER — Encounter: Payer: Self-pay | Admitting: Physician Assistant

## 2017-10-04 HISTORY — PX: SHOULDER SURGERY: SHX246

## 2017-10-26 ENCOUNTER — Ambulatory Visit: Payer: 59 | Admitting: Physician Assistant

## 2017-10-28 ENCOUNTER — Telehealth: Payer: 59 | Admitting: Family

## 2017-10-28 DIAGNOSIS — J028 Acute pharyngitis due to other specified organisms: Secondary | ICD-10-CM

## 2017-10-28 DIAGNOSIS — B9689 Other specified bacterial agents as the cause of diseases classified elsewhere: Secondary | ICD-10-CM

## 2017-10-28 MED ORDER — BENZONATATE 100 MG PO CAPS: 100.0000 mg | ORAL_CAPSULE | Freq: Three times a day (TID) | ORAL | 0 refills | Status: DC | PRN

## 2017-10-28 MED ORDER — DOXYCYCLINE HYCLATE 100 MG PO TABS: 100.0000 mg | ORAL_TABLET | Freq: Two times a day (BID) | ORAL | 0 refills | Status: DC

## 2017-10-28 NOTE — Progress Notes (Signed)
Thank you for the details you included in the comment boxes. Those details are very helpful in determining the best course of treatment for you and help us to provide the best care.  We are sorry that you are not feeling well.  Here is how we plan to help!  Based on your presentation I believe you most likely have A cough due to bacteria.  When patients have a fever and a productive cough with a change in color or increased sputum production, we are concerned about bacterial bronchitis.  If left untreated it can progress to pneumonia.  If your symptoms do not improve with your treatment plan it is important that you contact your provider.   I have prescribed Doxycycline 100 mg twice a day for 7 days     In addition you may use A non-prescription cough medication called Mucinex DM: take 2 tablets every 12 hours. and A prescription cough medication called Tessalon Perles 100mg. You may take 1-2 capsules every 8 hours as needed for your cough.   From your responses in the eVisit questionnaire you describe inflammation in the upper respiratory tract which is causing a significant cough.  This is commonly called Bronchitis and has four common causes:    Allergies  Viral Infections  Acid Reflux  Bacterial Infection Allergies, viruses and acid reflux are treated by controlling symptoms or eliminating the cause. An example might be a cough caused by taking certain blood pressure medications. You stop the cough by changing the medication. Another example might be a cough caused by acid reflux. Controlling the reflux helps control the cough.  USE OF BRONCHODILATOR ("RESCUE") INHALERS: There is a risk from using your bronchodilator too frequently.  The risk is that over-reliance on a medication which only relaxes the muscles surrounding the breathing tubes can reduce the effectiveness of medications prescribed to reduce swelling and congestion of the tubes themselves.  Although you feel brief relief from  the bronchodilator inhaler, your asthma may actually be worsening with the tubes becoming more swollen and filled with mucus.  This can delay other crucial treatments, such as oral steroid medications. If you need to use a bronchodilator inhaler daily, several times per day, you should discuss this with your provider.  There are probably better treatments that could be used to keep your asthma under control.     HOME CARE . Only take medications as instructed by your medical team. . Complete the entire course of an antibiotic. . Drink plenty of fluids and get plenty of rest. . Avoid close contacts especially the very young and the elderly . Cover your mouth if you cough or cough into your sleeve. . Always remember to wash your hands . A steam or ultrasonic humidifier can help congestion.   GET HELP RIGHT AWAY IF: . You develop worsening fever. . You become short of breath . You cough up blood. . Your symptoms persist after you have completed your treatment plan MAKE SURE YOU   Understand these instructions.  Will watch your condition.  Will get help right away if you are not doing well or get worse.  Your e-visit answers were reviewed by a board certified advanced clinical practitioner to complete your personal care plan.  Depending on the condition, your plan could have included both over the counter or prescription medications. If there is a problem please reply  once you have received a response from your provider. Your safety is important to us.  If you have   drug allergies check your prescription carefully.    You can use MyChart to ask questions about today's visit, request a non-urgent call back, or ask for a work or school excuse for 24 hours related to this e-Visit. If it has been greater than 24 hours you will need to follow up with your provider, or enter a new e-Visit to address those concerns. You will get an e-mail in the next two days asking about your experience.  I hope  that your e-visit has been valuable and will speed your recovery. Thank you for using e-visits.   

## 2017-10-31 ENCOUNTER — Other Ambulatory Visit: Payer: Self-pay

## 2017-10-31 ENCOUNTER — Encounter: Payer: Self-pay | Admitting: Physician Assistant

## 2017-10-31 ENCOUNTER — Ambulatory Visit: Payer: 59 | Admitting: Physician Assistant

## 2017-10-31 VITALS — BP 125/85 | HR 78 | Temp 98.9°F | Resp 18 | Ht 64.96 in | Wt 179.4 lb

## 2017-10-31 DIAGNOSIS — J069 Acute upper respiratory infection, unspecified: Secondary | ICD-10-CM | POA: Diagnosis not present

## 2017-10-31 DIAGNOSIS — R062 Wheezing: Secondary | ICD-10-CM | POA: Diagnosis not present

## 2017-10-31 DIAGNOSIS — R05 Cough: Secondary | ICD-10-CM

## 2017-10-31 DIAGNOSIS — R6889 Other general symptoms and signs: Secondary | ICD-10-CM

## 2017-10-31 DIAGNOSIS — R059 Cough, unspecified: Secondary | ICD-10-CM

## 2017-10-31 DIAGNOSIS — J3489 Other specified disorders of nose and nasal sinuses: Secondary | ICD-10-CM

## 2017-10-31 LAB — POC INFLUENZA A&B (BINAX/QUICKVUE)
INFLUENZA B, POC: NEGATIVE
Influenza A, POC: NEGATIVE

## 2017-10-31 MED ORDER — IPRATROPIUM BROMIDE 0.02 % IN SOLN
0.5000 mg | Freq: Once | RESPIRATORY_TRACT | Status: AC
  Administered 2017-10-31: 0.5 mg via RESPIRATORY_TRACT

## 2017-10-31 MED ORDER — PREDNISONE 20 MG PO TABS: 40.0000 mg | ORAL_TABLET | Freq: Every day | ORAL | 0 refills | Status: AC

## 2017-10-31 MED ORDER — ALBUTEROL SULFATE HFA 108 (90 BASE) MCG/ACT IN AERS: 2.0000 | INHALATION_SPRAY | RESPIRATORY_TRACT | 1 refills | Status: DC | PRN

## 2017-10-31 MED ORDER — ALBUTEROL SULFATE (2.5 MG/3ML) 0.083% IN NEBU
2.5000 mg | INHALATION_SOLUTION | Freq: Once | RESPIRATORY_TRACT | Status: AC
  Administered 2017-10-31: 2.5 mg via RESPIRATORY_TRACT

## 2017-10-31 NOTE — Progress Notes (Signed)
MRN: [id number] DOB: [birth year]  Subjective:   Audrey Christian is a 47 y.o. female presenting for chief complaint of Facial Pain (X 3 days) and Cough (X 3 days) .  Reports 3 day history of sinus pressure, dry throat, fever, clamminess, dry cough, and ear pain. Has some wheezing. Has tried mucinex, tessalon perles, and doxycycline with no full relief. Denies  ear pain, ear drainage, shortness of breath, chest pain and myalgia, nausea, vomiting, abdominal pain and diarrhea. Has been around sick contacts at the hospital. Has history of seasonal allergies, has history of seasonal asthma in the past. Patient has not had flu shot this season.Denies smoking. Denies recent travel. Denies any other aggravating or relieving factors, no other questions or concerns.  Ilene has a current medication list which includes the following prescription(s): benzonatate, calcium carbonate-vitamin d, clobetasol cream, docusate sodium, doxycycline, fexofenadine hcl, fluconazole, levothyroxine, aromatic inhalants, multivitamin, nystatin-triamcinolone, polyethylene glycol powder, and tetracycline. Also is allergic to erythromycin.  Ozie  has a past medical history of Allergy, Alopecia areata (05/2002), Anemia, ANXIETY (06/03/2007), ASTHMA (06/03/2007), Bradycardia, Chronic kidney disease, and HYPOTHYROIDISM (06/03/2007). Also  has a past surgical history that includes Cholecystectomy (05/2002) and Tubal ligation.   Objective:   Vitals: BP 125/85 (BP Location: Left Arm, Patient Position: Sitting, Cuff Size: Large)   Pulse 78   Temp 98.9 F (37.2 C) (Oral)   Resp 18   Ht 5' 4.96" (1.65 m)   Wt 179 lb 6.4 oz (81.4 kg)   SpO2 98%   BMI 29.89 kg/m   Physical Exam  Constitutional: She is oriented to person, place, and time. She appears well-developed and well-nourished.  HENT:  Head: Normocephalic and atraumatic.  Right Ear: Tympanic membrane, external ear and ear canal normal.  Left Ear: Tympanic membrane,  external ear and ear canal normal.  Nose: Mucosal edema and rhinorrhea present. Right sinus exhibits frontal sinus tenderness. Right sinus exhibits no maxillary sinus tenderness. Left sinus exhibits frontal sinus tenderness. Left sinus exhibits no maxillary sinus tenderness.  Mouth/Throat: Uvula is midline, oropharynx is clear and moist and mucous membranes are normal. No tonsillar exudate.  Eyes: Conjunctivae are normal.  Neck: Normal range of motion.  Cardiovascular: Normal rate, regular rhythm and normal heart sounds.  Pulmonary/Chest: Effort normal. She has wheezes (intermittent wheezing auscultated in posterior lung fileds). She has no rhonchi. She has no rales.  Lymphadenopathy:       Head (right side): No submental, no submandibular, no tonsillar, no preauricular, no posterior auricular and no occipital adenopathy present.       Head (left side): No submental, no submandibular, no tonsillar, no preauricular, no posterior auricular and no occipital adenopathy present.    She has no cervical adenopathy.       Right: No supraclavicular adenopathy present.       Left: No supraclavicular adenopathy present.  Neurological: She is alert and oriented to person, place, and time.  Skin: Skin is warm and dry.  Psychiatric: She has a normal mood and affect.  Vitals reviewed.   Results for orders placed or performed in visit on 10/31/17 (from the past 24 hour(s))  POC Influenza A&B(BINAX/QUICKVUE)     Status: Normal   Collection Time: 10/31/17  5:20 PM  Result Value Ref Range   Influenza A, POC Negative Negative   Influenza B, POC Negative Negative   Breathing treatment of duoneb given in office. Patient reports improvement in chest congestion. Wheezing improved but few wheezes still auscultated on  lung exam.   Assessment and Plan :  1. Flu-like symptoms - POC Influenza A&B(BINAX/QUICKVUE) 2. Wheezing Improved with breathing tx, but still present on lung exam. She is overall well appearing,  no distress. Vitals stable. Given Rx for oral prednisone and albuterol inhaler. Encouraged to continue doxy, tessalon perles, and mucinex. Recommend OTC sudafed. Advised to return to clinic if symptoms worsen, do not improve in 7-10 days, or as needed.  - albuterol (PROVENTIL) (2.5 MG/3ML) 0.083% nebulizer solution 2.5 mg - ipratropium (ATROVENT) nebulizer solution 0.5 mg - predniSONE (DELTASONE) 20 MG tablet; Take 2 tablets (40 mg total) by mouth daily with breakfast for 5 days.  Dispense: 10 tablet; Refill: 0 - albuterol (PROVENTIL HFA;VENTOLIN HFA) 108 (90 Base) MCG/ACT inhaler; Inhale 2 puffs into the lungs every 4 (four) hours as needed for wheezing or shortness of breath (cough, shortness of breath or wheezing.).  Dispense: 1 Inhaler; Refill: 1 3. Sinus pressure 4. Cough 5. Acute upper respiratory infection    Benjiman CoreBrittany Ky Rumple, PA-C  Primary Care at [facility] 10/31/2017 5:32 PM

## 2017-10-31 NOTE — Patient Instructions (Addendum)
We are going to treat your underlying inflammation with oral prednisone. Prednisone is a steroid and can cause side effects such as headache, irritability, nausea, vomiting, increased heart rate, increased blood pressure, increased blood sugar, appetite changes, and insomnia. Please take tablets in the morning with a full meal to help decrease the chances of these side effects.   -I recommend using albuterol inhaler every 4-6 hours for wheezing. -I also recommend picking up OTC sudafed to use for congestion and sinus pressure. Continue abx, tessalon perles, and mucinex.  - I recommend you rest, drink plenty of fluids, eat light meals including soups.  - Please let me know if you are not seeing any improvement or get worse in 7-10 days.     Upper Respiratory Infection, Adult Most upper respiratory infections (URIs) are caused by a virus. A URI affects the nose, throat, and upper air passages. The most common type of URI is often called "the common cold." Follow these instructions at home:  Take medicines only as told by your doctor.  Gargle warm saltwater or take cough drops to comfort your throat as told by your doctor.  Use a warm mist humidifier or inhale steam from a shower to increase air moisture. This may make it easier to breathe.  Drink enough fluid to keep your pee (urine) clear or pale yellow.  Eat soups and other clear broths.  Have a healthy diet.  Rest as needed.  Go back to work when your fever is gone or your doctor says it is okay. ? You may need to stay home longer to avoid giving your URI to others. ? You can also wear a face mask and wash your hands often to prevent spread of the virus.  Use your inhaler more if you have asthma.  Do not use any tobacco products, including cigarettes, chewing tobacco, or electronic cigarettes. If you need help quitting, ask your doctor. Contact a doctor if:  You are getting worse, not better.  Your symptoms are not helped by  medicine.  You have chills.  You are getting more short of breath.  You have brown or red mucus.  You have yellow or brown discharge from your nose.  You have pain in your face, especially when you bend forward.  You have a fever.  You have puffy (swollen) neck glands.  You have pain while swallowing.  You have white areas in the back of your throat. Get help right away if:  You have very bad or constant: ? Headache. ? Ear pain. ? Pain in your forehead, behind your eyes, and over your cheekbones (sinus pain). ? Chest pain.  You have long-lasting (chronic) lung disease and any of the following: ? Wheezing. ? Long-lasting cough. ? Coughing up blood. ? A change in your usual mucus.  You have a stiff neck.  You have changes in your: ? Vision. ? Hearing. ? Thinking. ? Mood. This information is not intended to replace advice given to you by your health care provider. Make sure you discuss any questions you have with your health care provider. Document Released: 03/08/2008 Document Revised: 05/23/2016 Document Reviewed: 12/26/2013 Elsevier Interactive Patient Education  2018 [employer]    IF you received an x-ray today, you will receive an invoice from [facility]. Please contact [facility] at [phone] with questions or concerns regarding your invoice.   IF you received labwork today, you will receive an invoice from [facility]. Please contact [facility] at [phone] with questions or concerns  regarding your invoice.   Our billing staff will not be able to assist you with questions regarding bills from these companies.  You will be contacted with the lab results as soon as they are available. The fastest way to get your results is to activate your My Chart account. Instructions are located on the last page of this paperwork. If you have not heard from [place] regarding the results in 2 weeks, please contact this office.

## 2017-11-03 ENCOUNTER — Ambulatory Visit: Payer: 59 | Admitting: Physician Assistant

## 2017-11-25 ENCOUNTER — Other Ambulatory Visit: Payer: Self-pay | Admitting: *Deleted

## 2017-11-25 MED ORDER — LEVOTHYROXINE SODIUM 75 MCG PO TABS: ORAL_TABLET | ORAL | 0 refills | Status: DC

## 2018-01-11 ENCOUNTER — Encounter: Payer: Self-pay | Admitting: Physician Assistant

## 2018-01-18 DIAGNOSIS — N3281 Overactive bladder: Secondary | ICD-10-CM | POA: Diagnosis not present

## 2018-01-18 DIAGNOSIS — N200 Calculus of kidney: Secondary | ICD-10-CM | POA: Diagnosis not present

## 2018-01-26 DIAGNOSIS — L304 Erythema intertrigo: Secondary | ICD-10-CM | POA: Diagnosis not present

## 2018-01-26 DIAGNOSIS — L400 Psoriasis vulgaris: Secondary | ICD-10-CM | POA: Diagnosis not present

## 2018-02-16 ENCOUNTER — Other Ambulatory Visit: Payer: Self-pay | Admitting: Endocrinology

## 2018-02-16 ENCOUNTER — Other Ambulatory Visit: Payer: Self-pay

## 2018-02-16 MED ORDER — LEVOTHYROXINE SODIUM 75 MCG PO TABS: ORAL_TABLET | ORAL | 0 refills | Status: DC

## 2018-03-20 DIAGNOSIS — M67912 Unspecified disorder of synovium and tendon, left shoulder: Secondary | ICD-10-CM | POA: Diagnosis not present

## 2018-03-20 DIAGNOSIS — M25512 Pain in left shoulder: Secondary | ICD-10-CM | POA: Diagnosis not present

## 2018-04-10 ENCOUNTER — Ambulatory Visit: Payer: 59 | Admitting: Family Medicine

## 2018-04-10 ENCOUNTER — Other Ambulatory Visit: Payer: Self-pay

## 2018-04-10 ENCOUNTER — Encounter: Payer: Self-pay | Admitting: Family Medicine

## 2018-04-10 VITALS — BP 116/81 | HR 92 | Temp 98.6°F | Ht 64.0 in | Wt 189.2 lb

## 2018-04-10 DIAGNOSIS — N3000 Acute cystitis without hematuria: Secondary | ICD-10-CM

## 2018-04-10 DIAGNOSIS — R309 Painful micturition, unspecified: Secondary | ICD-10-CM | POA: Diagnosis not present

## 2018-04-10 DIAGNOSIS — R350 Frequency of micturition: Secondary | ICD-10-CM | POA: Diagnosis not present

## 2018-04-10 LAB — POC MICROSCOPIC URINALYSIS (UMFC): Mucus: ABSENT

## 2018-04-10 LAB — POCT URINALYSIS DIP (MANUAL ENTRY)
Bilirubin, UA: NEGATIVE
Glucose, UA: NEGATIVE mg/dL
Leukocytes, UA: NEGATIVE
NITRITE UA: NEGATIVE
PH UA: 5.5 (ref 5.0–8.0)
Protein Ur, POC: NEGATIVE mg/dL
RBC UA: NEGATIVE
SPEC GRAV UA: 1.02 (ref 1.010–1.025)
UROBILINOGEN UA: 0.2 U/dL

## 2018-04-10 MED ORDER — PHENAZOPYRIDINE HCL 100 MG PO TABS: 100.0000 mg | ORAL_TABLET | Freq: Three times a day (TID) | ORAL | 0 refills | Status: DC | PRN

## 2018-04-10 MED ORDER — NITROFURANTOIN MONOHYD MACRO 100 MG PO CAPS: 100.0000 mg | ORAL_CAPSULE | Freq: Two times a day (BID) | ORAL | 0 refills | Status: DC

## 2018-04-10 NOTE — Patient Instructions (Addendum)
  Stop Keflex, start Macrodantin twice per day.  I will check the urine culture, but as you have been on antibiotics that may not be positive at this point.  Make sure to drink plenty of fluids, Pyridium 3 times per day as needed.  If any persistent fevers beyond the next 36 hours, or any worsening symptoms during that time, please return for recheck.    Urinary Tract Infection, Adult A urinary tract infection (UTI) is an infection of any part of the urinary tract. The urinary tract includes the:  Kidneys.  Ureters.  Bladder.  Urethra.  These organs make, store, and get rid of pee (urine) in the body. Follow these instructions at home:  Take over-the-counter and prescription medicines only as told by your doctor.  If you were prescribed an antibiotic medicine, take it as told by your doctor. Do not stop taking the antibiotic even if you start to feel better.  Avoid the following drinks: ? Alcohol. ? Caffeine. ? Tea. ? Carbonated drinks.  Drink enough fluid to keep your pee clear or pale yellow.  Keep all follow-up visits as told by your doctor. This is important.  Make sure to: ? Empty your bladder often and completely. Do not to hold pee for long periods of time. ? Empty your bladder before and after sex. ? Wipe from front to back after a bowel movement if you are female. Use each tissue one time when you wipe. Contact a doctor if:  You have back pain.  You have a fever.  You feel sick to your stomach (nauseous).  You throw up (vomit).  Your symptoms do not get better after 3 days.  Your symptoms go away and then come back. Get help right away if:  You have very bad back pain.  You have very bad lower belly (abdominal) pain.  You are throwing up and cannot keep down any medicines or water. This information is not intended to replace advice given to you by your health care provider. Make sure you discuss any questions you have with your health care  provider. Document Released: 03/08/2008 Document Revised: 02/26/2016 Document Reviewed: 08/11/2015 Elsevier Interactive Patient Education  2018 [employer]    IF you received an x-ray today, you will receive an invoice from [facility]. Please contact [facility] at [phone] with questions or concerns regarding your invoice.   IF you received labwork today, you will receive an invoice from [facility]. Please contact [facility] at [phone] with questions or concerns regarding your invoice.   Our billing staff will not be able to assist you with questions regarding bills from these companies.  You will be contacted with the lab results as soon as they are available. The fastest way to get your results is to activate your My Chart account. Instructions are located on the last page of this paperwork. If you have not heard from us regarding the results in 2 weeks, please contact this office.

## 2018-04-10 NOTE — Progress Notes (Signed)
Subjective:  By signing my name below, I, Audrey Christian, attest that this documentation has been prepared under the direction and in the presence of Audrey StaggersJeffrey Kailin Leu, MD. Electronically Signed: Stann Oresung-Kai Christian, Scribe. 04/10/2018 , 5:16 PM .  Patient was seen in Room 10 .   Patient ID: Audrey Christian, female    DOB: [birth year], 47 y.o.   MRN: [id number] Chief Complaint  Patient presents with  . abdomial pain    UTI symptions (urinary frequentcy and painful urinatation) and had kidney stone   HPI Audrey OrnLinda K Christian is Christian 47 y.o. female Here for urinary symptoms as above. She also reports history of nephrolithiasis. On chart review, CT abdomen done in 2011 with several small right renal calculi, non obstructing. She's followed by Dr. Retta Dionesahlstedt with urology with history of recurrent UTI and right renal atrophy with reflux. On urology note reviewed 01/18/18, no significant infections over prior 2 years; on keflex prophylaxis post coital.   Patient states she woke up on Thursday, July 4th, with pain over her right side to her lower abdomen. She notes having pain when urinating that day, with some relief after taking pyridium. She's been taking Keflex 750 mg bid. She feels Christian little bit better today, but not cleared. She's had low grade fever at night (last night 100.1). She denies nausea or vomiting. She denies hematuria. She denies swimming recently. She denies history of diverticulitis. Last urine culture, sensitive to Macrobid.   Patient Active Problem List   Diagnosis Date Noted  . Arthralgia 04/27/2017  . Polyp, corpus uteri 06/12/2015  . Multinodular goiter 04/01/2015  . Iron deficiency anemia 12/13/2012  . [facility]ine [facility] 12/13/2012  . Hypothyroidism 06/03/2007  . ALLERGIC RHINITIS 06/03/2007  . ASTHMA 06/03/2007   Past Medical History:  Diagnosis Date  . Allergy   . Alopecia areata 05/2002  . Anemia   . ANXIETY 06/03/2007  . ASTHMA  06/03/2007  . Bradycardia    Asymptomatic  . Chronic kidney disease   . HYPOTHYROIDISM 06/03/2007   Past Surgical History:  Procedure Laterality Date  . CHOLECYSTECTOMY  05/2002  . TUBAL LIGATION     Allergies  Allergen Reactions  . Erythromycin     REACTION: Upset Stomach   Prior to Admission medications   Medication Sig Start Date End Date Taking? Authorizing Provider  albuterol (PROVENTIL HFA;VENTOLIN HFA) 108 (90 Base) MCG/ACT inhaler Inhale 2 puffs into the lungs every 4 (four) hours as needed for wheezing or shortness of breath (cough, shortness of breath or wheezing.). 10/31/17  Yes Barnett AbuWiseman, [place]adaBrittany D, PA-C  Calcium Carbonate-Vitamin D (CALTRATE 600+D) 600-400 MG-UNIT per tablet Take 2 tablets by mouth daily.    Yes [provider]  cephALEXin (KEFLEX) 750 MG capsule Take 750 mg by mouth 2 (two) times daily.   Yes [provider]  Ciclopirox 1 % shampoo APPLICATIONS APPLY ON THE SKIN ALTERNATE WITH OTC TSAL 2 X WEEK 01/30/18  Yes [provider]  clobetasol (TEMOVATE) 0.05 % external solution APPLY TO AFFECTED AREA ON SCALP TWICE Christian DAY AS NEEDED 04/04/18  Yes [provider]  docusate sodium (COLACE) 100 MG capsule Take 3 capsules (300 mg total) by mouth daily. 11/05/15  Yes Collene Christian, Audrey A, MD  Fexofenadine HCl [facility](MUCINEX ALLERGY PO) Take by mouth.   Yes [provider]  levothyroxine (SYNTHROID, LEVOTHROID) 75 MCG tablet TAKE 1 TABLET BY MOUTH ONCE DAILY BEFORE BREAKFAST 02/16/18  Yes Audrey Christian, Sean, MD  Multiple  Vitamin (MULTIVITAMIN) tablet Take 1 tablet by mouth daily.     Yes [provider]  polyethylene glycol powder (GLYCOLAX/MIRALAX) powder TAKE 17 GRAMS BY MOUTH DAILY 11/29/16  Yes Leotis Christian, Chelle, PA-C  zolpidem (AMBIEN) 10 MG tablet  04/07/18  Yes [provider]   Social History   Socioeconomic History  . Marital status: Married    Spouse name: Audrey Christian  . Number of children: 1  . Years of education: Not on file    . 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 Financial resource strain: Not on file  . Food insecurity:    Worry: Not on file    Inability: Not on file  . Transportation needs:    Medical: Not on file    Non-medical: Not on file  Tobacco Use  . Smoking status: Never Smoker  . Smokeless tobacco: Never Used  Substance and Sexual Activity  . Alcohol use: No  . Drug use: No  . Sexual activity: Yes    Birth control/protection: None  Lifestyle  . Physical activity:    Days per week: Not on file    Minutes per session: Not on file  . Stress: Not on file  Relationships  . Social connections:    Talks on phone: Not on file    Gets together: Not on file    Attends religious service: Not on file    Active member of club or organization: Not on file    Attends meetings of clubs or organizations: Not on file    Relationship status: Not on file  . Intimate partner violence:    Fear of current or ex partner: Not on file    Emotionally abused: Not on file    Physically abused: Not on file    Forced sexual activity: Not on file  Other Topics Concern  . Not on file  Social History Narrative  . Not on file   Review of Systems  Constitutional: Negative for chills, fatigue, fever and unexpected weight change.  Respiratory: Negative for cough.   Gastrointestinal: Positive for abdominal pain. Negative for constipation, diarrhea, nausea and vomiting.  Genitourinary: Positive for dysuria and frequency.  Skin: Negative for rash and wound.  Neurological: Negative for dizziness, weakness and headaches.       Objective:   Physical Exam  Constitutional: She is oriented to person, place, and time. She appears well-developed and well-nourished. No distress.  HENT:  Head: Normocephalic and atraumatic.  Eyes: Pupils are equal, round, and reactive to light. EOM are normal.  Neck: Neck supple.   Cardiovascular: Normal rate.  Pulmonary/Chest: Effort normal. No respiratory distress.  Abdominal: There is tenderness in the right lower quadrant and suprapubic area. There is no tenderness at McBurney's point and negative Murphy's sign.  Tenderness over suprapubic into RLQ; LLQ and remainder of abdomen non tender  Musculoskeletal: Normal range of motion.  Neurological: She is alert and oriented to person, place, and time.  Skin: Skin is warm and dry.  Psychiatric: She has Christian normal mood and affect. Her behavior is normal.  Nursing note and vitals reviewed.   Vitals:   04/10/18 1639  BP: 116/81  Pulse: 92  Temp: 98.6 F (37 C)  TempSrc: Oral  SpO2: 95%  Weight: 189 lb 3.2 oz (85.8 kg)  Height: 5\' 4"  (1.626 m)   Results for orders placed or performed in  visit on 04/10/18  POCT urinalysis dipstick  Result Value Ref Range   Color, UA yellow yellow   Clarity, UA clear clear   Glucose, UA negative negative mg/dL   Bilirubin, UA negative negative   Ketones, POC UA trace (5) (Christian) negative mg/dL   Spec Grav, UA 1.610 9.604 - 1.025   Blood, UA negative negative   pH, UA 5.5 5.0 - 8.0   Protein Ur, POC negative negative mg/dL   Urobilinogen, UA 0.2 0.2 or 1.0 E.U./dL   Nitrite, UA Negative Negative   Leukocytes, UA Negative Negative  POCT Microscopic Urinalysis ([facility])  Result Value Ref Range   WBC,UR,HPF,POC Few (Christian) None WBC/hpf   RBC,UR,HPF,POC None None RBC/hpf   Bacteria None None, Too numerous to count   Mucus Absent Absent   Epithelial Cells, UR Per Microscopy Many (Christian) None, Too numerous to count cells/hpf   Crystals present        Assessment & Plan:    CHELLSIE GOMER is Christian 47 y.o. female Urine frequency - Plan: POCT urinalysis dipstick, POCT Microscopic Urinalysis ([facility]), Urine Culture, phenazopyridine (PYRIDIUM) 100 MG tablet  Painful urination - Plan: POCT urinalysis dipstick, POCT Microscopic Urinalysis ([facility]), Urine Culture, phenazopyridine (PYRIDIUM) 100 MG  tablet  Acute cystitis without hematuria - Plan: nitrofurantoin, macrocrystal-monohydrate, (MACROBID) 100 MG capsule History of vesicoureteral reflux and recurrent UTIs in the past.  Has been on Keflex postcoital for prevention, and started that recently for symptoms of UTI.    -Current antibiotic treatment may be affecting current urinalysis as well as potentially affect results of urine culture.  However with persistent symptoms and minimal improvement with current treatment, did agree to change medication to [facility] [address] which appeared to be sensitive on prior urine culture.    - She is currently afebrile, no CVA tenderness, unlikely pyelonephritis. rtc precautions if fevers return.   - Repeat urine culture, also prescribed Pyridium for symptomatic treatment.  RTC precautions given.   Meds ordered this encounter  Medications  . nitrofurantoin, macrocrystal-monohydrate, (MACROBID) 100 MG capsule    Sig: Take 1 capsule (100 mg total) by mouth 2 (two) times daily.    Dispense:  14 capsule    Refill:  0  . phenazopyridine (PYRIDIUM) 100 MG tablet    Sig: Take 1 tablet (100 mg total) by mouth 3 (three) times daily as needed for pain.    Dispense:  10 tablet    Refill:  0   Patient Instructions    Stop Keflex, start Macrodantin twice per day.  I will check the urine culture, but as you have been on antibiotics that may not be positive at this point.  Make sure to drink plenty of fluids, Pyridium 3 times per day as needed.  If any persistent fevers beyond the next 36 hours, or any worsening symptoms during that time, please return for recheck.    Urinary Tract Infection, Adult Christian urinary tract infection (UTI) is an infection of any part of the urinary tract. The urinary tract includes the:  Kidneys.  Ureters.  Bladder.  Urethra.  These organs make, store, and get rid of pee (urine) in the body. Follow these instructions at home:  Take over-the-counter and prescription medicines only as  told by your doctor.  If you were prescribed an antibiotic medicine, take it as told by your doctor. Do not stop taking the antibiotic even if you start to feel better.  Avoid the following drinks: ? Alcohol. ? Caffeine. ? Tea. ? Carbonated drinks.  Drink enough fluid to keep your pee clear or pale yellow.  Keep all follow-up visits as told by your doctor. This is important.  Make sure to: ? Empty your bladder often and completely. Do not to hold pee for long periods of time. ? Empty your bladder before and after sex. ? Wipe from front to back after Christian bowel movement if you are female. Use each tissue one time when you wipe. Contact Christian doctor if:  You have back pain.  You have Christian fever.  You feel sick to your stomach (nauseous).  You throw up (vomit).  Your symptoms do not get better after 3 days.  Your symptoms go away and then come back. Get help right away if:  You have very bad back pain.  You have very bad lower belly (abdominal) pain.  You are throwing up and cannot keep down any medicines or water. This information is not intended to replace advice given to you by your health care provider. Make sure you discuss any questions you have with your health care provider. Document Released: 03/08/2008 Document Revised: 02/26/2016 Document Reviewed: 08/11/2015 Elsevier Interactive Patient Education  2018 [employer].    IF you received an x-ray today, you will receive an invoice from [facility]. Please contact [facility] at [phone] with questions or concerns regarding your invoice.   IF you received labwork today, you will receive an invoice from [place]. Please contact [facility] at [phone] with questions or concerns regarding your invoice.   Our billing staff will not be able to assist you with questions regarding bills from these companies.  You will be contacted with the lab results as soon as they are available. The fastest way to get  your results is to activate your My Chart account. Instructions are located on the last page of this paperwork. If you have not heard from [place] regarding the results in 2 weeks, please contact this office.       I personally performed the services described in this documentation, which was scribed in my presence. The recorded information has been reviewed and considered for accuracy and completeness, addended by me as needed, and agree with information above.  Signed,   Audrey Staggers, MD [facility] at [facility].  04/12/18 3:00 PM

## 2018-04-11 LAB — URINE CULTURE

## 2018-04-13 DIAGNOSIS — L304 Erythema intertrigo: Secondary | ICD-10-CM | POA: Diagnosis not present

## 2018-04-13 DIAGNOSIS — L400 Psoriasis vulgaris: Secondary | ICD-10-CM | POA: Diagnosis not present

## 2018-04-15 ENCOUNTER — Ambulatory Visit: Payer: 59 | Admitting: Emergency Medicine

## 2018-04-15 ENCOUNTER — Encounter: Payer: Self-pay | Admitting: Emergency Medicine

## 2018-04-15 VITALS — BP 118/75 | HR 70 | Temp 98.8°F | Resp 17 | Ht 64.5 in | Wt 188.0 lb

## 2018-04-15 DIAGNOSIS — F418 Other specified anxiety disorders: Secondary | ICD-10-CM | POA: Diagnosis not present

## 2018-04-15 DIAGNOSIS — R002 Palpitations: Secondary | ICD-10-CM | POA: Diagnosis not present

## 2018-04-15 MED ORDER — ALPRAZOLAM 0.5 MG PO TABS: 0.5000 mg | ORAL_TABLET | Freq: Two times a day (BID) | ORAL | 0 refills | Status: DC | PRN

## 2018-04-15 NOTE — Patient Instructions (Addendum)
     IF you received an x-ray today, you will receive an invoice from [facility]. Please contact [facility] at [phone] with questions or concerns regarding your invoice.   IF you received labwork today, you will receive an invoice from [facility]. Please contact [facility] at [phone] with questions or concerns regarding your invoice.   Our billing staff will not be able to assist you with questions regarding bills from these companies.  You will be contacted with the lab results as soon as they are available. The fastest way to get your results is to activate your My Chart account. Instructions are located on the last page of this paperwork. If you have not heard from us regarding the results in 2 weeks, please contact this office.     Palpitations A palpitation is the feeling that your heart:  Has an uneven (irregular) heartbeat.  Is beating faster than normal.  Is fluttering.  Is skipping a beat.  This is usually not a serious problem. In some cases, you may need more medical tests. Follow these instructions at home:  Avoid: ? Caffeine in coffee, tea, soft drinks, diet pills, and energy drinks. ? Chocolate. ? Alcohol.  Do not use any tobacco products. These include cigarettes, chewing tobacco, and e-cigarettes. If you need help quitting, ask your doctor.  Try to reduce your stress. These things may help: ? Yoga. ? Meditation. ? Physical activity. Swimming, jogging, and walking are good choices. ? A method that helps you use your mind to control things in your body, like heartbeats (biofeedback).  Get plenty of rest and sleep.  Take over-the-counter and prescription medicines only as told by your doctor.  Keep all follow-up visits as told by your doctor. This is important. Contact a doctor if:  Your heartbeat is still fast or uneven after 24 hours.  Your palpitations occur more often. Get help right away if:  You have chest pain.  You  feel short of breath.  You have a very bad headache.  You feel dizzy.  You pass out (faint). This information is not intended to replace advice given to you by your health care provider. Make sure you discuss any questions you have with your health care provider. Document Released: 06/29/2008 Document Revised: 02/26/2016 Document Reviewed: 06/05/2015 Elsevier Interactive Patient Education  2018 [employer]vier Inc.  

## 2018-04-15 NOTE — Progress Notes (Signed)
Audrey Christian 47 y.o.   Chief Complaint  Patient presents with  . Palpitations    after taking medicine     HISTORY OF PRESENT ILLNESS: This is a 47 y.o. female complaining of palpitations that started 3 to 4 days ago.  Intermittent and not associated with any symptomatology.  Denies syncope, difficulty breathing, chest pain, dizziness, nausea or vomiting.  She started taking Macrobid for UTI 2 days prior.  Has been under increased emotional stress due to mother's terminal medical condition.  She is under hospice care at home.  Was drinking more coffee than usual but since has cut down on her caffeine intake.  Describes palpitations as: "Like butterflies", "jumping like crazy".  No other significant symptoms.  HPI   Prior to Admission medications   Medication Sig Start Date End Date Taking? Authorizing Provider  albuterol (PROVENTIL HFA;VENTOLIN HFA) 108 (90 Base) MCG/ACT inhaler Inhale 2 puffs into the lungs every 4 (four) hours as needed for wheezing or shortness of breath (cough, shortness of breath or wheezing.). 10/31/17  Yes Barnett Abu, [place] D, PA-C  Calcium Carbonate-Vitamin D (CALTRATE 600+D) 600-400 MG-UNIT per tablet Take 2 tablets by mouth daily.    Yes [provider]  Ciclopirox 1 % shampoo APPLICATIONS APPLY ON THE SKIN ALTERNATE WITH OTC TSAL 2 X WEEK 01/30/18  Yes [provider]  clobetasol (TEMOVATE) 0.05 % external solution APPLY TO AFFECTED AREA ON SCALP TWICE A DAY AS NEEDED 04/04/18  Yes [provider]  docusate sodium (COLACE) 100 MG capsule Take 3 capsules (300 mg total) by mouth daily. 11/05/15  Yes Collene Gobble, MD  Fexofenadine HCl [facility] ALLERGY PO) Take by mouth.   Yes [provider]  levothyroxine (SYNTHROID, LEVOTHROID) 75 MCG tablet TAKE 1 TABLET BY MOUTH ONCE DAILY BEFORE BREAKFAST 02/16/18  Yes Romero Belling, MD  Multiple Vitamin (MULTIVITAMIN) tablet Take 1 tablet by mouth daily.     Yes [provider]    nitrofurantoin, macrocrystal-monohydrate, (MACROBID) 100 MG capsule Take 1 capsule (100 mg total) by mouth 2 (two) times daily. 04/10/18  Yes Shade Flood, MD  polyethylene glycol powder (GLYCOLAX/MIRALAX) powder TAKE 17 GRAMS BY MOUTH DAILY 11/29/16  Yes Jeffery, Chelle, PA-C  zolpidem (AMBIEN) 10 MG tablet  04/07/18  Yes [provider]  ALPRAZolam (XANAX) 0.5 MG tablet Take 1 tablet (0.5 mg total) by mouth 2 (two) times daily as needed for anxiety. 04/15/18   Georgina Quint, MD  phenazopyridine (PYRIDIUM) 100 MG tablet Take 1 tablet (100 mg total) by mouth 3 (three) times daily as needed for pain. Patient not taking: Reported on 04/15/2018 04/10/18   Shade Flood, MD    Allergies  Allergen Reactions  . Erythromycin     REACTION: Upset Stomach    Patient Active Problem List   Diagnosis Date Noted  . Arthralgia 04/27/2017  . Polyp, corpus uteri 06/12/2015  . Multinodular goiter 04/01/2015  . Iron deficiency anemia 12/13/2012  . Routine general medical examination at a health care facility 12/13/2012  . Hypothyroidism 06/03/2007  . ALLERGIC RHINITIS 06/03/2007  . ASTHMA 06/03/2007    Past Medical History:  Diagnosis Date  . Allergy   . Alopecia areata 05/2002  . Anemia   . ANXIETY 06/03/2007  . ASTHMA 06/03/2007  . Bradycardia    Asymptomatic  . Chronic kidney disease   . HYPOTHYROIDISM 06/03/2007    Past Surgical History:  Procedure Laterality Date  . CHOLECYSTECTOMY  05/2002  . TUBAL LIGATION  Social History   Socioeconomic History  . Marital status: Married    Spouse name: Les Pou  . Number of children: 1  . Years of education: Not on file  . 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 Financial resource strain: Not on file  . Food insecurity:    Worry: Not on file    Inability: Not on file  . Transportation needs:    Medical: Not on file     Non-medical: Not on file  Tobacco Use  . Smoking status: Never Smoker  . Smokeless tobacco: Never Used  Substance and Sexual Activity  . Alcohol use: No  . Drug use: No  . Sexual activity: Yes    Birth control/protection: None  Lifestyle  . Physical activity:    Days per week: Not on file    Minutes per session: Not on file  . Stress: Not on file  Relationships  . Social connections:    Talks on phone: Not on file    Gets together: Not on file    Attends religious service: Not on file    Active member of club or organization: Not on file    Attends meetings of clubs or organizations: Not on file    Relationship status: Not on file  . Intimate partner violence:    Fear of current or ex partner: Not on file    Emotionally abused: Not on file    Physically abused: Not on file    Forced sexual activity: Not on file  Other Topics Concern  . Not on file  Social History Narrative  . Not on file    Family History  Problem Relation Age of Onset  . Hypothyroidism Mother   . Hypertension Mother   . Cancer Father   . Cancer Maternal Grandmother   . Cancer Maternal Grandfather   . Cancer Paternal Grandmother   . Cancer Paternal Grandfather      Review of Systems  Constitutional: Negative.  Negative for chills and fever.  HENT: Negative.  Negative for nosebleeds and sore throat.   Eyes: Negative.  Negative for blurred vision and double vision.  Respiratory: Negative.  Negative for cough, hemoptysis, shortness of breath and wheezing.   Cardiovascular: Positive for palpitations. Negative for chest pain and leg swelling.  Gastrointestinal: Negative.  Negative for abdominal pain, diarrhea, nausea and vomiting.  Genitourinary: Negative.  Negative for dysuria and hematuria.  Musculoskeletal: Negative for myalgias and neck pain.  Skin: Negative.  Negative for rash.  Neurological: Negative.  Negative for dizziness, sensory change, speech change, focal weakness, seizures, loss of  consciousness and headaches.  Endo/Heme/Allergies: Negative.   All other systems reviewed and are negative.    Vitals:   04/15/18 1149  BP: 118/75  Pulse: 70  Resp: 17  Temp: 98.8 F (37.1 C)  SpO2: 98%     Physical Exam  Constitutional: She is oriented to person, place, and time. She appears well-developed and well-nourished.  HENT:  Head: Normocephalic and atraumatic.  Right Ear: External ear normal.  Left Ear: External ear normal.  Mouth/Throat: Oropharynx is clear and moist.  Eyes: Pupils are equal, round, and reactive to light. Conjunctivae and EOM are normal.  Neck: Normal range of motion. Neck supple. No JVD present. No thyromegaly present.  Cardiovascular: Normal rate, regular rhythm and normal heart sounds.  Pulmonary/Chest: Effort normal and breath sounds  normal.  Abdominal: Soft. Bowel sounds are normal. She exhibits no distension. There is no tenderness.  Musculoskeletal: Normal range of motion. She exhibits no edema.  Lymphadenopathy:    She has no cervical adenopathy.  Neurological: She is alert and oriented to person, place, and time. No sensory deficit. She exhibits normal muscle tone. Coordination normal.  Skin: Skin is warm and dry. Capillary refill takes less than 2 seconds. No rash noted.  Psychiatric: She has a normal mood and affect. Her behavior is normal.  Vitals reviewed.  EKG: Normal sinus rhythm with 1 PVC.  No acute ischemic changes.  Ventricular rate 72/min.  Normal EKG.  ASSESSMENT & PLAN: Bonita QuinLinda was seen today for palpitations.  Diagnoses and all orders for this visit:  Palpitations -     CBC with Differential/Platelet -     Comprehensive metabolic panel -     TSH -     Hemoglobin A1c -     EKG 12-Lead  Situational anxiety -     ALPRAZolam (XANAX) 0.5 MG tablet; Take 1 tablet (0.5 mg total) by mouth 2 (two) times daily as needed for anxiety.    Patient Instructions       IF you received an x-ray today, you will receive an  invoice from [facility]. Please contact [facility] at [phone] with questions or concerns regarding your invoice.   IF you received labwork today, you will receive an invoice from [facility]. Please contact [facility] at [phone] with questions or concerns regarding your invoice.   Our billing staff will not be able to assist you with questions regarding bills from these companies.  You will be contacted with the lab results as soon as they are available. The fastest way to get your results is to activate your My Chart account. Instructions are located on the last page of this paperwork. If you have not heard from us regarding the results in 2 weeks, please contact this office.      Palpitations A palpitation is the feeling that your heart:  Has an uneven (irregular) heartbeat.  Is beating faster than normal.  Is fluttering.  Is skipping a beat.  This is usually not a serious problem. In some cases, you may need more medical tests. Follow these instructions at home:  Avoid: ? Caffeine in coffee, tea, soft drinks, diet pills, and energy drinks. ? Chocolate. ? Alcohol.  Do not use any tobacco products. These include cigarettes, chewing tobacco, and e-cigarettes. If you need help quitting, ask your doctor.  Try to reduce your stress. These things may help: ? Yoga. ? Meditation. ? Physical activity. Swimming, jogging, and walking are good choices. ? A method that helps you use your mind to control things in your body, like heartbeats (biofeedback).  Get plenty of rest and sleep.  Take over-the-counter and prescription medicines only as told by your doctor.  Keep all follow-up visits as told by your doctor. This is important. Contact a doctor if:  Your heartbeat is still fast or uneven after 24 hours.  Your palpitations occur more often. Get help right away if:  You have chest pain.  You feel short of breath.  You have a very bad  headache.  You feel dizzy.  You pass out (faint). This information is not intended to replace advice given to you by your health care provider. Make sure you discuss any questions you have with your health care provider. Document Released: 06/29/2008 Document Revised: 02/26/2016 Document Reviewed: 06/05/2015 Elsevier Interactive  Patient Education  2018 [employer].      Agustina Caroli, MD [facility] [employer] [facility] [facility]

## 2018-04-16 LAB — CBC WITH DIFFERENTIAL/PLATELET
BASOS: 1 %
Basophils Absolute: 0 10*3/uL (ref 0.0–0.2)
EOS (ABSOLUTE): 0.2 10*3/uL (ref 0.0–0.4)
EOS: 4 %
HEMOGLOBIN: 12.7 g/dL (ref 11.1–15.9)
Hematocrit: 40.9 % (ref 34.0–46.6)
IMMATURE GRANS (ABS): 0 10*3/uL (ref 0.0–0.1)
IMMATURE GRANULOCYTES: 0 %
LYMPHS: 26 %
Lymphocytes Absolute: 1.7 10*3/uL (ref 0.7–3.1)
MCH: 28.6 pg (ref 26.6–33.0)
MCHC: 31.1 g/dL — ABNORMAL LOW (ref 31.5–35.7)
MCV: 92 fL (ref 79–97)
MONOCYTES: 8 %
Monocytes Absolute: 0.5 10*3/uL (ref 0.1–0.9)
NEUTROS ABS: 4 10*3/uL (ref 1.4–7.0)
Neutrophils: 61 %
PLATELETS: 313 10*3/uL (ref 150–450)
RBC: 4.44 x10E6/uL (ref 3.77–5.28)
RDW: 14.1 % (ref 12.3–15.4)
WBC: 6.4 10*3/uL (ref 3.4–10.8)

## 2018-04-16 LAB — COMPREHENSIVE METABOLIC PANEL
ALBUMIN: 4.1 g/dL (ref 3.5–5.5)
ALT: 23 IU/L (ref 0–32)
AST: 21 IU/L (ref 0–40)
Albumin/Globulin Ratio: 1.6 (ref 1.2–2.2)
Alkaline Phosphatase: 63 IU/L (ref 39–117)
BILIRUBIN TOTAL: 0.3 mg/dL (ref 0.0–1.2)
BUN / CREAT RATIO: 10 (ref 9–23)
BUN: 8 mg/dL (ref 6–24)
CALCIUM: 9.9 mg/dL (ref 8.7–10.2)
CHLORIDE: 104 mmol/L (ref 96–106)
CO2: 23 mmol/L (ref 20–29)
Creatinine, Ser: 0.84 mg/dL (ref 0.57–1.00)
GFR, EST AFRICAN AMERICAN: 96 mL/min/{1.73_m2} (ref >59)
GFR, EST NON AFRICAN AMERICAN: 83 mL/min/{1.73_m2} (ref >59)
Globulin, Total: 2.6 g/dL (ref 1.5–4.5)
Glucose: 105 mg/dL — ABNORMAL HIGH (ref 65–99)
Potassium: 4.2 mmol/L (ref 3.5–5.2)
Sodium: 139 mmol/L (ref 134–144)
TOTAL PROTEIN: 6.7 g/dL (ref 6.0–8.5)

## 2018-04-16 LAB — TSH: TSH: 2.32 u[IU]/mL (ref 0.450–4.500)

## 2018-04-16 LAB — HEMOGLOBIN A1C
Est. average glucose Bld gHb Est-mCnc: 100 mg/dL
HEMOGLOBIN A1C: 5.1 % (ref 4.8–5.6)

## 2018-04-17 ENCOUNTER — Encounter: Payer: Self-pay | Admitting: Emergency Medicine

## 2018-04-19 ENCOUNTER — Other Ambulatory Visit: Payer: Self-pay

## 2018-04-19 ENCOUNTER — Encounter (HOSPITAL_COMMUNITY): Payer: Self-pay | Admitting: Emergency Medicine

## 2018-04-19 ENCOUNTER — Emergency Department (HOSPITAL_COMMUNITY)
Admission: EM | Admit: 2018-04-19 | Discharge: 2018-04-19 | Disposition: A | Discharge status: Home / Self Care | Payer: 59 | Attending: Emergency Medicine | Admitting: Emergency Medicine

## 2018-04-19 DIAGNOSIS — Z79899 Other long term (current) drug therapy: Secondary | ICD-10-CM | POA: Diagnosis not present

## 2018-04-19 DIAGNOSIS — N189 Chronic kidney disease, unspecified: Secondary | ICD-10-CM | POA: Insufficient documentation

## 2018-04-19 DIAGNOSIS — N200 Calculus of kidney: Secondary | ICD-10-CM | POA: Diagnosis not present

## 2018-04-19 DIAGNOSIS — E039 Hypothyroidism, unspecified: Secondary | ICD-10-CM | POA: Diagnosis not present

## 2018-04-19 DIAGNOSIS — R109 Unspecified abdominal pain: Secondary | ICD-10-CM | POA: Diagnosis present

## 2018-04-19 DIAGNOSIS — J45909 Unspecified asthma, uncomplicated: Secondary | ICD-10-CM | POA: Insufficient documentation

## 2018-04-19 DIAGNOSIS — R1031 Right lower quadrant pain: Secondary | ICD-10-CM | POA: Insufficient documentation

## 2018-04-19 LAB — URINALYSIS, ROUTINE W REFLEX MICROSCOPIC
Bilirubin Urine: NEGATIVE
Glucose, UA: NEGATIVE mg/dL
HGB URINE DIPSTICK: NEGATIVE
Ketones, ur: NEGATIVE mg/dL
Leukocytes, UA: NEGATIVE
Nitrite: NEGATIVE
Protein, ur: NEGATIVE mg/dL
SPECIFIC GRAVITY, URINE: 1.019 (ref 1.005–1.030)
pH: 6 (ref 5.0–8.0)

## 2018-04-19 LAB — COMPREHENSIVE METABOLIC PANEL
ALT: 21 U/L (ref 0–44)
ANION GAP: 6 (ref 5–15)
AST: 22 U/L (ref 15–41)
Albumin: 3.4 g/dL — ABNORMAL LOW (ref 3.5–5.0)
Alkaline Phosphatase: 51 U/L (ref 38–126)
BUN: 19 mg/dL (ref 6–20)
CALCIUM: 8.9 mg/dL (ref 8.9–10.3)
CHLORIDE: 109 mmol/L (ref 98–111)
CO2: 25 mmol/L (ref 22–32)
Creatinine, Ser: 1.04 mg/dL — ABNORMAL HIGH (ref 0.44–1.00)
GFR calc non Af Amer: >60 mL/min (ref >60)
Glucose, Bld: 94 mg/dL (ref 70–99)
Potassium: 3.6 mmol/L (ref 3.5–5.1)
SODIUM: 140 mmol/L (ref 135–145)
Total Bilirubin: 0.4 mg/dL (ref 0.3–1.2)
Total Protein: 6.4 g/dL — ABNORMAL LOW (ref 6.5–8.1)

## 2018-04-19 LAB — CBC
HCT: 37.7 % (ref 36.0–46.0)
HEMOGLOBIN: 11.9 g/dL — AB (ref 12.0–15.0)
MCH: 29.2 pg (ref 26.0–34.0)
MCHC: 31.6 g/dL (ref 30.0–36.0)
MCV: 92.4 fL (ref 78.0–100.0)
Platelets: 311 10*3/uL (ref 150–400)
RBC: 4.08 MIL/uL (ref 3.87–5.11)
RDW: 13.7 % (ref 11.5–15.5)
WBC: 7 10*3/uL (ref 4.0–10.5)

## 2018-04-19 LAB — I-STAT BETA HCG BLOOD, ED (MC, WL, AP ONLY)

## 2018-04-19 LAB — LIPASE, BLOOD: LIPASE: 47 U/L (ref 11–51)

## 2018-04-19 NOTE — ED Notes (Signed)
Pt discharged from ED; instructions provided; Pt encouraged to return to ED if symptoms worsen and to f/u with PCP; Pt verbalized understanding of all instructions 

## 2018-04-19 NOTE — ED Triage Notes (Signed)
Pt reports RLQ abd pain X1 week, pt states she has frequent UTI/ kidney infection and thinks this is it. Pt recently stopped taking Macrobid Monday.

## 2018-04-19 NOTE — ED Provider Notes (Signed)
[facility] Provider Note   CSN: [id number] Arrival date & time: 04/19/18  0326     History   Chief Complaint Chief Complaint  Patient presents with  . Abdominal Pain    HPI Audrey Christian is a 47 y.o. female.  The history is provided by the patient. No language interpreter was used.  Abdominal Pain       47 year old female with history of anxiety presenting for evaluation of abdominal pain.  Patient mention she has history of kidney reflux and for more than a week she has had pain to her right side of her abdomen.  She described pain as a sharp sensation, more present when she pushed on it, and improves with resting.  She rates her pain currently as 6 out of 10.  Pain is not associate with fever, chills, chest pain, trouble breathing, coughing, dysuria, hematuria, vaginal bleeding, vaginal discharge, back pain or rash.  She was seen by her PCP more than a week ago for this complaint and was giving Keflex which she took for nearly a week.  She will went to urgent care a week ago for the same problem, and was placed on Macrobid in which she recently finished but report no improvement.  She believes her symptoms related to her kidney reflux.  She has had prior cholecystectomy.  She denies any new sexual partner, she denies any post prandial pain.  She mentioned having only one ovary and denies any vaginal bleeding or vaginal discharge.  She takes Tylenol on occasion for her symptoms.  No history of kidney stone.  Past Medical History:  Diagnosis Date  . Allergy   . Alopecia areata 05/2002  . Anemia   . ANXIETY 06/03/2007  . ASTHMA 06/03/2007  . Bradycardia    Asymptomatic  . Chronic kidney disease   . HYPOTHYROIDISM 06/03/2007    Patient Active Problem List   Diagnosis Date Noted  . Arthralgia 04/27/2017  . Polyp, corpus uteri 06/12/2015  . Multinodular goiter 04/01/2015  . Iron deficiency anemia 12/13/2012  . Routine general medical  examination at a health care facility 12/13/2012  . Hypothyroidism 06/03/2007  . ALLERGIC RHINITIS 06/03/2007  . ASTHMA 06/03/2007    Past Surgical History:  Procedure Laterality Date  . CHOLECYSTECTOMY  05/2002  . TUBAL LIGATION       OB History   None      Home Medications    Prior to Admission medications   Medication Sig Start Date End Date Taking? Authorizing Provider  albuterol (PROVENTIL HFA;VENTOLIN HFA) 108 (90 Base) MCG/ACT inhaler Inhale 2 puffs into the lungs every 4 (four) hours as needed for wheezing or shortness of breath (cough, shortness of breath or wheezing.). 10/31/17  Yes Barnett AbuWiseman, [place] D, PA-C  ALPRAZolam Prudy Feeler(XANAX) 0.5 MG tablet Take 1 tablet (0.5 mg total) by mouth 2 (two) times daily as needed for anxiety. 04/15/18  Yes Sagardia, Eilleen KempfMiguel Jose, MD  Calcium Carbonate-Vitamin D (CALTRATE 600+D) 600-400 MG-UNIT per tablet Take 2 tablets by mouth daily.    Yes [provider]  Ciclopirox 1 % shampoo APPLICATIONS APPLY ON THE SKIN ALTERNATE WITH OTC TSAL 2 X WEEK 01/30/18  Yes [provider]  clobetasol (TEMOVATE) 0.05 % external solution APPLY TO AFFECTED AREA ON SCALP TWICE A DAY AS NEEDED 04/04/18  Yes [provider]  levothyroxine (SYNTHROID, LEVOTHROID) 75 MCG tablet TAKE 1 TABLET BY MOUTH ONCE DAILY BEFORE BREAKFAST 02/16/18  Yes Romero BellingEllison, Sean, MD  Multiple Vitamin (  MULTIVITAMIN WITH MINERALS) TABS tablet Take 1 tablet by mouth daily.   Yes [provider]  Multiple Vitamin (MULTIVITAMIN) tablet Take 1 tablet by mouth daily.     Yes [provider]  polyethylene glycol powder (GLYCOLAX/MIRALAX) powder TAKE 17 GRAMS BY MOUTH DAILY 11/29/16  Yes Jeffery, Chelle, PA-C  zolpidem (AMBIEN) 10 MG tablet Take 10 mg by mouth at bedtime as needed for sleep.  04/07/18  Yes [provider]  docusate sodium (COLACE) 100 MG capsule Take 3 capsules (300 mg total) by mouth daily. Patient not taking: Reported on 04/19/2018 11/05/15    Collene Gobble, MD  nitrofurantoin, macrocrystal-monohydrate, (MACROBID) 100 MG capsule Take 1 capsule (100 mg total) by mouth 2 (two) times daily. Patient not taking: Reported on 04/19/2018 04/10/18   Shade Flood, MD  phenazopyridine (PYRIDIUM) 100 MG tablet Take 1 tablet (100 mg total) by mouth 3 (three) times daily as needed for pain. Patient not taking: Reported on 04/15/2018 04/10/18   Shade Flood, MD    Family History Family History  Problem Relation Age of Onset  . Hypothyroidism Mother   . Hypertension Mother   . Cancer Father   . Cancer Maternal Grandmother   . Cancer Maternal Grandfather   . Cancer Paternal Grandmother   . Cancer Paternal Grandfather     Social History Social History   Tobacco Use  . Smoking status: Never Smoker  . Smokeless tobacco: Never Used  Substance Use Topics  . Alcohol use: No  . Drug use: No     Allergies   Erythromycin   Review of Systems Review of Systems  Gastrointestinal: Positive for abdominal pain.  All other systems reviewed and are negative.    Physical Exam Updated Vital Signs BP 134/84   Pulse 82   Temp 99 F (37.2 C)   Resp 19   Ht 5' 4.5" (1.638 m)   Wt 86.2 kg (190 lb)   SpO2 98%   BMI 32.11 kg/m   Physical Exam  Constitutional: She appears well-developed and well-nourished. No distress.  HENT:  Head: Atraumatic.  Eyes: Conjunctivae are normal.  Neck: Neck supple.  Cardiovascular: Normal rate and regular rhythm.  Pulmonary/Chest: Effort normal and breath sounds normal.  Abdominal: Soft. Normal appearance. There is tenderness (Mild tenderness to right lower quadrant on palpation without guarding or rebound tenderness.) in the right lower quadrant.  No CVA tenderness  Genitourinary:  Genitourinary Comments: Pelvic exam deferred.  Neurological: She is alert.  Skin: No rash noted.  Psychiatric: She has a normal mood and affect.  Nursing note and vitals reviewed.    ED Treatments / Results    Labs (all labs ordered are listed, but only abnormal results are displayed) Labs Reviewed  COMPREHENSIVE METABOLIC PANEL - Abnormal; Notable for the following components:      Result Value   Creatinine, Ser 1.04 (*)    Total Protein 6.4 (*)    Albumin 3.4 (*)    All other components within normal limits  CBC - Abnormal; Notable for the following components:   Hemoglobin 11.9 (*)    All other components within normal limits  LIPASE, BLOOD  URINALYSIS, ROUTINE W REFLEX MICROSCOPIC  I-STAT BETA HCG BLOOD, ED ([facility], [facility], [facility] ONLY)    EKG None  Radiology No results found.  Procedures Procedures (including critical care time)  Medications Ordered in ED Medications - No data to display   Initial Impression / Assessment and Plan / ED Course  I  have reviewed the triage vital signs and the nursing notes.  Pertinent labs & imaging results that were available during my care of the patient were reviewed by me and considered in my medical decision making (see chart for details).     BP 134/84   Pulse 82   Temp 99 F (37.2 C)   Resp 19   Ht 5' 4.5" (1.638 m)   Wt 86.2 kg (190 lb)   SpO2 98%   BMI 32.11 kg/m    Final Clinical Impressions(s) / ED Diagnoses   Final diagnoses:  Abdominal pain, RLQ (right lower quadrant)    ED Discharge Orders    None     6:32 AM Patient here for pain to the right lower quadrant ongoing for more than a week despite using several different antibiotics.  She denies any true urinary symptom.  Her labs are reassuring, normal urinalysis, normal WBC, normal kidney function, and her pregnancy test is negative.  No new sexual partners and his symptoms is not suggestive of ovarian torsion or TOA.  She does not have any CVA tenderness or blood in the urine to suggest kidney stone.  Given the prolonged duration of her symptoms, I have low suspicion for appendicitis.  Urine without any signs of urinary tract infection.  Given her chronic history of "kidney  reflux" I strongly encourage patient to follow-up with neurologist for further care.  Patient voiced understanding and agrees with plan.  At this time advanced imaging is not indicated however patient was then symptoms return.  Her vital signs stable and she is afebrile.   Fayrene Helper, PA-C 04/19/18 1610    Dione Booze, MD 04/19/18 [phone]

## 2018-04-28 ENCOUNTER — Encounter: Payer: Self-pay | Admitting: Endocrinology

## 2018-04-28 ENCOUNTER — Ambulatory Visit: Payer: 59 | Admitting: Endocrinology

## 2018-04-28 VITALS — BP 118/72 | HR 67 | Wt 189.2 lb

## 2018-04-28 DIAGNOSIS — Z0000 Encounter for general adult medical examination without abnormal findings: Secondary | ICD-10-CM

## 2018-04-28 NOTE — Progress Notes (Signed)
   Subjective:    Patient ID: Audrey Christian, female    DOB: 05/18/1971, 47 y.o.   MRN: [id number]  HPI    Review of Systems     Objective:   Physical Exam        Assessment & Plan:

## 2018-05-16 ENCOUNTER — Other Ambulatory Visit: Payer: Self-pay | Admitting: Endocrinology

## 2018-06-02 ENCOUNTER — Ambulatory Visit: Payer: 59 | Admitting: Endocrinology

## 2018-06-07 DIAGNOSIS — M25512 Pain in left shoulder: Secondary | ICD-10-CM | POA: Diagnosis not present

## 2018-06-12 DIAGNOSIS — M24812 Other specific joint derangements of left shoulder, not elsewhere classified: Secondary | ICD-10-CM | POA: Diagnosis not present

## 2018-06-16 ENCOUNTER — Ambulatory Visit: Payer: 59 | Admitting: Endocrinology

## 2018-06-26 DIAGNOSIS — M7542 Impingement syndrome of left shoulder: Secondary | ICD-10-CM | POA: Diagnosis not present

## 2018-06-26 DIAGNOSIS — M7552 Bursitis of left shoulder: Secondary | ICD-10-CM | POA: Diagnosis not present

## 2018-06-26 DIAGNOSIS — M19012 Primary osteoarthritis, left shoulder: Secondary | ICD-10-CM | POA: Diagnosis not present

## 2018-06-26 DIAGNOSIS — G8918 Other acute postprocedural pain: Secondary | ICD-10-CM | POA: Diagnosis not present

## 2018-07-05 DIAGNOSIS — M19012 Primary osteoarthritis, left shoulder: Secondary | ICD-10-CM | POA: Diagnosis not present

## 2018-07-19 ENCOUNTER — Other Ambulatory Visit: Payer: Self-pay | Admitting: Physician Assistant

## 2018-07-20 ENCOUNTER — Ambulatory Visit (INDEPENDENT_AMBULATORY_CARE_PROVIDER_SITE_OTHER): Payer: 59 | Admitting: Endocrinology

## 2018-07-20 ENCOUNTER — Encounter: Payer: Self-pay | Admitting: Endocrinology

## 2018-07-20 VITALS — BP 112/70 | HR 78 | Ht 64.5 in | Wt 194.6 lb

## 2018-07-20 DIAGNOSIS — Z0000 Encounter for general adult medical examination without abnormal findings: Secondary | ICD-10-CM | POA: Diagnosis not present

## 2018-07-20 MED ORDER — LEVOTHYROXINE SODIUM 75 MCG PO TABS: 75.0000 ug | ORAL_TABLET | Freq: Every day | ORAL | 3 refills | Status: DC

## 2018-07-20 MED ORDER — TRAZODONE HCL 100 MG PO TABS: 100.0000 mg | ORAL_TABLET | Freq: Every day | ORAL | 11 refills | Status: DC

## 2018-07-20 NOTE — Patient Instructions (Addendum)
In addition to what you take for the scalp. Try adding selenium sulfide ("selsun blue") shampoo.   I have sent a prescription to your pharmacy, for the sleep. Please consider these measures for your health:  minimize alcohol.  Do not use tobacco products.  Have a colonoscopy at least every 10 years from age 48.  Women should have an annual mammogram from age 76.  Keep firearms safely stored.  Always use seat belts.  have working smoke alarms in your home.  See an eye doctor and dentist regularly.  Never drive under the influence of alcohol or drugs (including prescription drugs).  Those with fair skin should take precautions against the sun, and should carefully examine their skin once per month, for any new or changed moles.   Best wished with your new primary care provider.      Insomnia Insomnia is a sleep disorder that makes it difficult to fall asleep or to stay asleep. Insomnia can cause tiredness (fatigue), low energy, difficulty concentrating, mood swings, and poor performance at work or school. There are three different ways to classify insomnia:  Difficulty falling asleep.  Difficulty staying asleep.  Waking up too early in the morning.  Any type of insomnia can be long-term (chronic) or short-term (acute). Both are common. Short-term insomnia usually lasts for three months or less. Chronic insomnia occurs at least three times a week for longer than three months. What are the causes? Insomnia may be caused by another condition, situation, or substance, such as:  Anxiety.  Certain medicines.  Gastroesophageal reflux disease (GERD) or other gastrointestinal conditions.  Asthma or other breathing conditions.  Restless legs syndrome, sleep apnea, or other sleep disorders.  Chronic pain.  Menopause. This may include hot flashes.  Stroke.  Abuse of alcohol, tobacco, or illegal drugs.  Depression.  Caffeine.  Neurological disorders, such as Alzheimer disease.  An  overactive thyroid (hyperthyroidism).  The cause of insomnia may not be known. What increases the risk? Risk factors for insomnia include:  Gender. Women are more commonly affected than men.  Age. Insomnia is more common as you get older.  Stress. This may involve your professional or personal life.  Income. Insomnia is more common in people with lower income.  Lack of exercise.  Irregular work schedule or night shifts.  Traveling between different time zones.  What are the signs or symptoms? If you have insomnia, trouble falling asleep or trouble staying asleep is the main symptom. This may lead to other symptoms, such as:  Feeling fatigued.  Feeling nervous about going to sleep.  Not feeling rested in the morning.  Having trouble concentrating.  Feeling irritable, anxious, or depressed.  How is this treated? Treatment for insomnia depends on the cause. If your insomnia is caused by an underlying condition, treatment will focus on addressing the condition. Treatment may also include:  Medicines to help you sleep.  Counseling or therapy.  Lifestyle adjustments.  Follow these instructions at home:  Take medicines only as directed by your health care provider.  Keep regular sleeping and waking hours. Avoid naps.  Keep a sleep diary to help you and your health care provider figure out what could be causing your insomnia. Include: ? When you sleep. ? When you wake up during the night. ? How well you sleep. ? How rested you feel the next day. ? Any side effects of medicines you are taking. ? What you eat and drink.  Make your bedroom a comfortable place where  it is easy to fall asleep: ? Put up shades or special blackout curtains to block light from outside. ? Use a white noise machine to block noise. ? Keep the temperature cool.  Exercise regularly as directed by your health care provider. Avoid exercising right before bedtime.  Use relaxation techniques to  manage stress. Ask your health care provider to suggest some techniques that may work well for you. These may include: ? Breathing exercises. ? Routines to release muscle tension. ? Visualizing peaceful scenes.  Cut back on alcohol, caffeinated beverages, and cigarettes, especially close to bedtime. These can disrupt your sleep.  Do not overeat or eat spicy foods right before bedtime. This can lead to digestive discomfort that can make it hard for you to sleep.  Limit screen use before bedtime. This includes: ? Watching TV. ? Using your smartphone, tablet, and computer.  Stick to a routine. This can help you fall asleep faster. Try to do a quiet activity, brush your teeth, and go to bed at the same time each night.  Get out of bed if you are still awake after 15 minutes of trying to sleep. Keep the lights down, but try reading or doing a quiet activity. When you feel sleepy, go back to bed.  Make sure that you drive carefully. Avoid driving if you feel very sleepy.  Keep all follow-up appointments as directed by your health care provider. This is important. Contact a health care provider if:  You are tired throughout the day or have trouble in your daily routine due to sleepiness.  You continue to have sleep problems or your sleep problems get worse. Get help right away if:  You have serious thoughts about hurting yourself or someone else. This information is not intended to replace advice given to you by your health care provider. Make sure you discuss any questions you have with your health care provider. Document Released: 09/17/2000 Document Revised: 02/20/2016 Document Reviewed: 06/21/2014 Elsevier Interactive Patient Education  [employer].

## 2018-07-20 NOTE — Progress Notes (Signed)
Subjective:    Patient ID: Audrey Christian, female    DOB: 04-20-1971, 47 y.o.   MRN: [id number]  HPI Pt is here for regular wellness examination, and is feeling pretty well in general, and says chronic med probs are stable, except as noted below She reports nightly insomnia.  She declines flu shot.   Past Medical History:  Diagnosis Date  . Allergy   . Alopecia areata 05/2002  . Anemia   . ANXIETY 06/03/2007  . ASTHMA 06/03/2007  . Bradycardia    Asymptomatic  . Chronic kidney disease   . HYPOTHYROIDISM 06/03/2007    Past Surgical History:  Procedure Laterality Date  . CHOLECYSTECTOMY  05/2002  . TUBAL LIGATION      Social History   Socioeconomic History  . Marital status: Married    Spouse name: Les Pou  . Number of children: 1  . Years of education: Not on file  . 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 Financial resource strain: Not on file  . Food insecurity:    Worry: Not on file    Inability: Not on file  . Transportation needs:    Medical: Not on file    Non-medical: Not on file  Tobacco Use  . Smoking status: Never Smoker  . Smokeless tobacco: Never Used  Substance and Sexual Activity  . Alcohol use: No  . Drug use: No  . Sexual activity: Yes    Birth control/protection: None  Lifestyle  . Physical activity:    Days per week: Not on file    Minutes per session: Not on file  . Stress: Not on file  Relationships  . Social connections:    Talks on phone: Not on file    Gets together: Not on file    Attends religious service: Not on file    Active member of club or organization: Not on file    Attends meetings of clubs or organizations: Not on file    Relationship status: Not on file  . Intimate partner violence:    Fear of current or ex partner: Not on file    Emotionally abused: Not on file    Physically abused: Not on file    Forced sexual  activity: Not on file  Other Topics Concern  . Not on file  Social History Narrative  . Not on file    Current Outpatient Medications on File Prior to Visit  Medication Sig Dispense Refill  . albuterol (PROVENTIL HFA;VENTOLIN HFA) 108 (90 Base) MCG/ACT inhaler Inhale 2 puffs into the lungs every 4 (four) hours as needed for wheezing or shortness of breath (cough, shortness of breath or wheezing.). 1 Inhaler 1  . Calcium Carbonate-Vitamin D (CALTRATE 600+D) 600-400 MG-UNIT per tablet Take 2 tablets by mouth daily.     . Ciclopirox 1 % shampoo APPLICATIONS APPLY ON THE SKIN ALTERNATE WITH OTC TSAL 2 X WEEK  2  . clobetasol (TEMOVATE) 0.05 % external solution APPLY TO AFFECTED AREA ON SCALP TWICE A DAY AS NEEDED  2  . Multiple Vitamin (MULTIVITAMIN WITH MINERALS) TABS tablet Take 1 tablet by mouth daily.    . Multiple Vitamin (MULTIVITAMIN) tablet Take 1 tablet by mouth daily.      . polyethylene glycol powder (GLYCOLAX/MIRALAX) powder TAKE 17 GRAMS BY MOUTH DAILY 527 g 0   No current facility-administered medications on file prior  to visit.     Allergies  Allergen Reactions  . Erythromycin     REACTION: Upset Stomach    Family History  Problem Relation Age of Onset  . Hypothyroidism Mother   . Hypertension Mother   . Cancer Father   . Cancer Maternal Grandmother   . Cancer Maternal Grandfather   . Cancer Paternal Grandmother   . Cancer Paternal Grandfather     BP 112/70 (BP Location: Right Arm, Patient Position: Sitting, Cuff Size: Large)   Pulse 78   Ht 5' 4.5" (1.638 m)   Wt 194 lb 9.6 oz (88.3 kg)   SpO2 97%   BMI 32.89 kg/m   Review of Systems Denies fever, fatigue, visual loss, hearing loss, chest pain, sob, back pain, depression, cold intolerance, BRBPR, hematuria, syncope, numbness.  She has easy bruising, scalp itching, and seasonal nasal congestion.       Objective:   Physical Exam VS: see vs page GEN: no distress HEAD: head: no deformity eyes: no  periorbital swelling, no proptosis external nose and ears are normal mouth: no lesion seen NECK: supple, thyroid is not enlarged CHEST WALL: no deformity LUNGS: clear to auscultation CV: reg rate and rhythm, no murmur ABD: abdomen is soft, nontender.  no hepatosplenomegaly.  not distended.  no hernia MUSCULOSKELETAL: muscle bulk and strength are grossly normal.  no obvious joint swelling.  gait is normal and steady EXTEMITIES: no deformity.  no ulcer on the feet.  feet are of normal color and temp.  no edema PULSES: dorsalis pedis intact bilat.  no carotid bruit NEURO:  cn 2-12 grossly intact.   readily moves all 4's.  sensation is intact to touch on the feet SKIN:  Normal texture and temperature.  No rash or suspicious lesion is visible.   NODES:  None palpable at the neck PSYCH: alert, well-oriented.  Does not appear anxious nor depressed.       Assessment & Plan:  Wellness visit today, with problems stable, except as noted. Insomnia: worse  Patient Instructions  In addition to what you take for the scalp. Try adding selenium sulfide ("selsun blue") shampoo.   I have sent a prescription to your pharmacy, for the sleep. Please consider these measures for your health:  minimize alcohol.  Do not use tobacco products.  Have a colonoscopy at least every 10 years from age 52.  Women should have an annual mammogram from age 14.  Keep firearms safely stored.  Always use seat belts.  have working smoke alarms in your home.  See an eye doctor and dentist regularly.  Never drive under the influence of alcohol or drugs (including prescription drugs).  Those with fair skin should take precautions against the sun, and should carefully examine their skin once per month, for any new or changed moles.   Best wished with your new primary care provider.      Insomnia Insomnia is a sleep disorder that makes it difficult to fall asleep or to stay asleep. Insomnia can cause tiredness (fatigue), low  energy, difficulty concentrating, mood swings, and poor performance at work or school. There are three different ways to classify insomnia:  Difficulty falling asleep.  Difficulty staying asleep.  Waking up too early in the morning.  Any type of insomnia can be long-term (chronic) or short-term (acute). Both are common. Short-term insomnia usually lasts for three months or less. Chronic insomnia occurs at least three times a week for longer than three months. What are the causes? Insomnia  may be caused by another condition, situation, or substance, such as:  Anxiety.  Certain medicines.  Gastroesophageal reflux disease (GERD) or other gastrointestinal conditions.  Asthma or other breathing conditions.  Restless legs syndrome, sleep apnea, or other sleep disorders.  Chronic pain.  Menopause. This may include hot flashes.  Stroke.  Abuse of alcohol, tobacco, or illegal drugs.  Depression.  Caffeine.  Neurological disorders, such as Alzheimer disease.  An overactive thyroid (hyperthyroidism).  The cause of insomnia may not be known. What increases the risk? Risk factors for insomnia include:  Gender. Women are more commonly affected than men.  Age. Insomnia is more common as you get older.  Stress. This may involve your professional or personal life.  Income. Insomnia is more common in people with lower income.  Lack of exercise.  Irregular work schedule or night shifts.  Traveling between different time zones.  What are the signs or symptoms? If you have insomnia, trouble falling asleep or trouble staying asleep is the main symptom. This may lead to other symptoms, such as:  Feeling fatigued.  Feeling nervous about going to sleep.  Not feeling rested in the morning.  Having trouble concentrating.  Feeling irritable, anxious, or depressed.  How is this treated? Treatment for insomnia depends on the cause. If your insomnia is caused by an underlying  condition, treatment will focus on addressing the condition. Treatment may also include:  Medicines to help you sleep.  Counseling or therapy.  Lifestyle adjustments.  Follow these instructions at home:  Take medicines only as directed by your health care provider.  Keep regular sleeping and waking hours. Avoid naps.  Keep a sleep diary to help you and your health care provider figure out what could be causing your insomnia. Include: ? When you sleep. ? When you wake up during the night. ? How well you sleep. ? How rested you feel the next day. ? Any side effects of medicines you are taking. ? What you eat and drink.  Make your bedroom a comfortable place where it is easy to fall asleep: ? Put up shades or special blackout curtains to block light from outside. ? Use a white noise machine to block noise. ? Keep the temperature cool.  Exercise regularly as directed by your health care provider. Avoid exercising right before bedtime.  Use relaxation techniques to manage stress. Ask your health care provider to suggest some techniques that may work well for you. These may include: ? Breathing exercises. ? Routines to release muscle tension. ? Visualizing peaceful scenes.  Cut back on alcohol, caffeinated beverages, and cigarettes, especially close to bedtime. These can disrupt your sleep.  Do not overeat or eat spicy foods right before bedtime. This can lead to digestive discomfort that can make it hard for you to sleep.  Limit screen use before bedtime. This includes: ? Watching TV. ? Using your smartphone, tablet, and computer.  Stick to a routine. This can help you fall asleep faster. Try to do a quiet activity, brush your teeth, and go to bed at the same time each night.  Get out of bed if you are still awake after 15 minutes of trying to sleep. Keep the lights down, but try reading or doing a quiet activity. When you feel sleepy, go back to bed.  Make sure that you  drive carefully. Avoid driving if you feel very sleepy.  Keep all follow-up appointments as directed by your health care provider. This is important. Contact a health  care provider if:  You are tired throughout the day or have trouble in your daily routine due to sleepiness.  You continue to have sleep problems or your sleep problems get worse. Get help right away if:  You have serious thoughts about hurting yourself or someone else. This information is not intended to replace advice given to you by your health care provider. Make sure you discuss any questions you have with your health care provider. Document Released: 09/17/2000 Document Revised: 02/20/2016 Document Reviewed: 06/21/2014 Elsevier Interactive Patient Education  [employer].

## 2018-07-26 DIAGNOSIS — M722 Plantar fascial fibromatosis: Secondary | ICD-10-CM | POA: Diagnosis not present

## 2018-08-11 ENCOUNTER — Other Ambulatory Visit: Payer: Self-pay | Admitting: Endocrinology

## 2018-08-11 NOTE — Telephone Encounter (Signed)
Please refill x 3 mos Further refills from new PCP 

## 2018-08-11 NOTE — Telephone Encounter (Signed)
Please advise if refill is appropriate 

## 2018-11-13 DIAGNOSIS — Z124 Encounter for screening for malignant neoplasm of cervix: Secondary | ICD-10-CM | POA: Diagnosis not present

## 2018-11-13 DIAGNOSIS — Z1231 Encounter for screening mammogram for malignant neoplasm of breast: Secondary | ICD-10-CM | POA: Diagnosis not present

## 2018-11-13 DIAGNOSIS — Z01419 Encounter for gynecological examination (general) (routine) without abnormal findings: Secondary | ICD-10-CM | POA: Diagnosis not present

## 2018-11-13 LAB — HM MAMMOGRAPHY

## 2018-11-14 ENCOUNTER — Other Ambulatory Visit: Payer: Self-pay | Admitting: Endocrinology

## 2018-11-23 ENCOUNTER — Encounter: Payer: Self-pay | Admitting: Family Medicine

## 2018-11-23 NOTE — Progress Notes (Signed)
IMPRESSION: no evidence of malignancy 

## 2018-11-27 DIAGNOSIS — M25462 Effusion, left knee: Secondary | ICD-10-CM | POA: Diagnosis not present

## 2018-11-27 DIAGNOSIS — M25562 Pain in left knee: Secondary | ICD-10-CM | POA: Diagnosis not present

## 2018-12-09 ENCOUNTER — Telehealth: Payer: Self-pay | Admitting: Endocrinology

## 2018-12-09 NOTE — Telephone Encounter (Signed)
Please schedule f/u appt for next available appointment  

## 2018-12-11 NOTE — Telephone Encounter (Signed)
Patient stated she did not need this medication right now and believes this medication is on automatic refill at the pharmacy.

## 2018-12-23 DIAGNOSIS — J0140 Acute pansinusitis, unspecified: Secondary | ICD-10-CM | POA: Diagnosis not present

## 2018-12-28 ENCOUNTER — Telehealth: Payer: 59 | Admitting: Nurse Practitioner

## 2018-12-28 DIAGNOSIS — B9689 Other specified bacterial agents as the cause of diseases classified elsewhere: Secondary | ICD-10-CM | POA: Diagnosis not present

## 2018-12-28 DIAGNOSIS — J019 Acute sinusitis, unspecified: Secondary | ICD-10-CM | POA: Diagnosis not present

## 2018-12-28 MED ORDER — FLUTICASONE PROPIONATE 50 MCG/ACT NA SUSP: 2.0000 | Freq: Every day | NASAL | 0 refills | Status: DC

## 2018-12-28 NOTE — Progress Notes (Signed)
We are sorry that you are not feeling well.  Here is how we plan to help!  Based on what you have shared with me it looks like you have sinusitis.  Sinusitis is inflammation and infection in the sinus cavities of the head.  Based on your presentation I believe you most likely have Acute Bacterial Sinusitis.  This is an infection caused by bacteria and is treated with antibiotics. I believe you should continue the antibiotic you are currently taking.  You may use an oral decongestant such as Mucinex D or if you have glaucoma or high blood pressure use plain Mucinex. Saline nasal spray help and can safely be used as often as needed for congestion.  If you develop worsening sinus pain, fever or notice severe headache and vision changes, or if symptoms are not better after completion of antibiotic, please schedule an appointment with a health care provider.  I am also going to prescribe Fluticasone to help with your nasal symptoms.  I would recommend using a neti pot first, then begin the Fluticasone nasal spray.  It would also be helpful to use over-the-counter Sudafed.  Sinus infections are not as easily transmitted as other respiratory infection, however we still recommend that you avoid close contact with loved ones, especially the very young and elderly.  Remember to wash your hands thoroughly throughout the day as this is the number one way to prevent the spread of infection!  Home Care:  Only take medications as instructed by your medical team.  Complete the entire course of an antibiotic.  Do not take these medications with alcohol.  A steam or ultrasonic humidifier can help congestion.  You can place a towel over your head and breathe in the steam from hot water coming from a faucet.  Avoid close contacts especially the very young and the elderly.  Cover your mouth when you cough or sneeze.  Always remember to wash your hands.  Get Help Right Away If:  You develop worsening fever or  sinus pain.  You develop a severe head ache or visual changes.  Your symptoms persist after you have completed your treatment plan.  Make sure you  Understand these instructions.  Will watch your condition.  Will get help right away if you are not doing well or get worse.  Your e-visit answers were reviewed by a board certified advanced clinical practitioner to complete your personal care plan.  Depending on the condition, your plan could have included both over the counter or prescription medications.  If there is a problem please reply  once you have received a response from your provider.  Your safety is important to [place].  If you have drug allergies check your prescription carefully.    You can use MyChart to ask questions about today's visit, request a non-urgent call back, or ask for a work or school excuse for 24 hours related to this e-Visit. If it has been greater than 24 hours you will need to follow up with your provider, or enter a new e-Visit to address those concerns.  You will get an e-mail in the next two days asking about your experience.  I hope that your e-visit has been valuable and will speed your recovery. Thank you for using e-visits.

## 2019-01-02 ENCOUNTER — Other Ambulatory Visit: Payer: Self-pay | Admitting: Physician Assistant

## 2019-01-02 DIAGNOSIS — R062 Wheezing: Secondary | ICD-10-CM

## 2019-01-21 ENCOUNTER — Other Ambulatory Visit: Payer: Self-pay | Admitting: Nurse Practitioner

## 2019-01-21 DIAGNOSIS — J019 Acute sinusitis, unspecified: Principal | ICD-10-CM

## 2019-01-21 DIAGNOSIS — B9689 Other specified bacterial agents as the cause of diseases classified elsewhere: Secondary | ICD-10-CM

## 2019-03-19 ENCOUNTER — Telehealth: Payer: 59 | Admitting: Family

## 2019-03-19 DIAGNOSIS — J019 Acute sinusitis, unspecified: Secondary | ICD-10-CM | POA: Diagnosis not present

## 2019-03-19 MED ORDER — AMOXICILLIN-POT CLAVULANATE 875-125 MG PO TABS: 1.0000 | ORAL_TABLET | Freq: Two times a day (BID) | ORAL | 0 refills | Status: DC

## 2019-03-19 NOTE — Progress Notes (Signed)
We are sorry that you are not feeling well.  Here is how we plan to help!  Based on what you have shared with me it looks like you have sinusitis.  Sinusitis is inflammation and infection in the sinus cavities of the head.  Based on your presentation I believe you most likely have Acute Bacterial Sinusitis.  This is an infection caused by bacteria and is treated with antibiotics. I have prescribed Augmentin 875mg/125mg one tablet twice daily with food, for 7 days. You may use an oral decongestant such as Mucinex D or if you have glaucoma or high blood pressure use plain Mucinex. Saline nasal spray help and can safely be used as often as needed for congestion.  If you develop worsening sinus pain, fever or notice severe headache and vision changes, or if symptoms are not better after completion of antibiotic, please schedule an appointment with a health care provider.    Approximately 5 minutes was spent documenting and reviewing patient's chart.   Sinus infections are not as easily transmitted as other respiratory infection, however we still recommend that you avoid close contact with loved ones, especially the very young and elderly.  Remember to wash your hands thoroughly throughout the day as this is the number one way to prevent the spread of infection!  Home Care:  Only take medications as instructed by your medical team.  Complete the entire course of an antibiotic.  Do not take these medications with alcohol.  A steam or ultrasonic humidifier can help congestion.  You can place a towel over your head and breathe in the steam from hot water coming from a faucet.  Avoid close contacts especially the very young and the elderly.  Cover your mouth when you cough or sneeze.  Always remember to wash your hands.  Get Help Right Away If:  You develop worsening fever or sinus pain.  You develop a severe head ache or visual changes.  Your symptoms persist after you have completed your  treatment plan.  Make sure you  Understand these instructions.  Will watch your condition.  Will get help right away if you are not doing well or get worse.  Your e-visit answers were reviewed by a board certified advanced clinical practitioner to complete your personal care plan.  Depending on the condition, your plan could have included both over the counter or prescription medications.  If there is a problem please reply  once you have received a response from your provider.  Your safety is important to us.  If you have drug allergies check your prescription carefully.    You can use MyChart to ask questions about today's visit, request a non-urgent call back, or ask for a work or school excuse for 24 hours related to this e-Visit. If it has been greater than 24 hours you will need to follow up with your provider, or enter a new e-Visit to address those concerns.  You will get an e-mail in the next two days asking about your experience.  I hope that your e-visit has been valuable and will speed your recovery. Thank you for using e-visits.    

## 2019-03-30 ENCOUNTER — Other Ambulatory Visit: Payer: Self-pay

## 2019-03-30 ENCOUNTER — Encounter (HOSPITAL_COMMUNITY): Payer: Self-pay

## 2019-03-30 ENCOUNTER — Ambulatory Visit (HOSPITAL_COMMUNITY)
Admission: EM | Admit: 2019-03-30 | Discharge: 2019-03-30 | Disposition: A | Discharge status: Home / Self Care | Payer: 59 | Attending: Internal Medicine | Admitting: Internal Medicine

## 2019-03-30 DIAGNOSIS — B9689 Other specified bacterial agents as the cause of diseases classified elsewhere: Secondary | ICD-10-CM

## 2019-03-30 DIAGNOSIS — J019 Acute sinusitis, unspecified: Secondary | ICD-10-CM

## 2019-03-30 DIAGNOSIS — J0101 Acute recurrent maxillary sinusitis: Secondary | ICD-10-CM

## 2019-03-30 MED ORDER — DOXYCYCLINE HYCLATE 100 MG PO CAPS: 100.0000 mg | ORAL_CAPSULE | Freq: Two times a day (BID) | ORAL | 0 refills | Status: DC

## 2019-03-30 MED ORDER — FLUTICASONE PROPIONATE 50 MCG/ACT NA SUSP: 2.0000 | Freq: Every day | NASAL | 0 refills | Status: DC

## 2019-03-30 MED ORDER — METHYLPREDNISOLONE ACETATE 40 MG/ML IJ SUSP
40.0000 mg | Freq: Once | INTRAMUSCULAR | Status: AC
  Administered 2019-03-30: 40 mg via INTRAMUSCULAR

## 2019-03-30 MED ORDER — FEXOFENADINE HCL 60 MG PO TABS: 60.0000 mg | ORAL_TABLET | Freq: Two times a day (BID) | ORAL | 0 refills | Status: DC

## 2019-03-30 MED ORDER — METHYLPREDNISOLONE ACETATE 40 MG/ML IJ SUSP
INTRAMUSCULAR | Status: AC
  Filled 2019-03-30: qty 1

## 2019-03-30 NOTE — ED Provider Notes (Signed)
[facility]    CSN: [id number] Arrival date & time: 03/30/19  1518      History   Chief Complaint Chief Complaint  Patient presents with  . Appointment    3:30  . Sinusitis  . Otalgia    HPI Audrey Christian is a 48 y.o. female.   Audrey Christian presents with complaints of persistent sinus pressure. She thinks it started even almost a year ago. Was given 7 days of augmentin, finished three days ago, but symptoms persist. They improved some with antibiotics. No fevers. No cough. Minimal nasal drainage. No shortness of breath. Frontal and maxillary pressure. Left ear pain. Has had similar in the past, has not had to follow with ENT for this, however. Uses saline nasal spray as well as mucinex d which hasn't helped. Doesn't take anything for allergies.     ROS per HPI, negative if not otherwise mentioned.      Past Medical History:  Diagnosis Date  . Allergy   . Alopecia areata 05/2002  . Anemia   . ANXIETY 06/03/2007  . ASTHMA 06/03/2007  . Bradycardia    Asymptomatic  . Chronic kidney disease   . HYPOTHYROIDISM 06/03/2007    Patient Active Problem List   Diagnosis Date Noted  . Arthralgia 04/27/2017  . Polyp, corpus uteri 06/12/2015  . Multinodular goiter 04/01/2015  . Iron deficiency anemia 12/13/2012  . Routine general medical examination at a health care facility 12/13/2012  . Hypothyroidism 06/03/2007  . ALLERGIC RHINITIS 06/03/2007  . ASTHMA 06/03/2007    Past Surgical History:  Procedure Laterality Date  . CHOLECYSTECTOMY  05/2002  . TUBAL LIGATION      OB History   No obstetric history on file.      Home Medications    Prior to Admission medications   Medication Sig Start Date End Date Taking? Authorizing Provider  traZODone (DESYREL) 100 MG tablet TAKE 1 TABLET BY MOUTH EVERYDAY AT BEDTIME 08/11/18  Yes Romero BellingEllison, Sean, MD  albuterol (PROVENTIL HFA;VENTOLIN HFA) 108 (90 Base) MCG/ACT inhaler Inhale 2 puffs into the lungs every 4  (four) hours as needed for wheezing or shortness of breath (cough, shortness of breath or wheezing.). 10/31/17   Benjiman CoreWiseman, Brittany D, PA-C  Calcium Carbonate-Vitamin D (CALTRATE 600+D) 600-400 MG-UNIT per tablet Take 2 tablets by mouth daily.     [provider]  Ciclopirox 1 % shampoo APPLICATIONS APPLY ON THE SKIN ALTERNATE WITH OTC TSAL 2 X WEEK 01/30/18   [provider]  clobetasol (TEMOVATE) 0.05 % external solution APPLY TO AFFECTED AREA ON SCALP TWICE A DAY AS NEEDED 04/04/18   [provider]  doxycycline (VIBRAMYCIN) 100 MG capsule Take 1 capsule (100 mg total) by mouth 2 (two) times daily. 03/30/19   Georgetta HaberBurky, Avonna Iribe B, NP  fexofenadine (ALLEGRA) 60 MG tablet Take 1 tablet (60 mg total) by mouth 2 (two) times daily. 03/30/19   Georgetta HaberBurky, Naliyah Neth B, NP  fluticasone (FLONASE) 50 MCG/ACT nasal spray Place 2 sprays into both nostrils daily for 10 days. 03/30/19 04/09/19  Georgetta HaberBurky, Terrianna Holsclaw B, NP  levothyroxine (SYNTHROID, LEVOTHROID) 75 MCG tablet Take 1 tablet (75 mcg total) by mouth daily. 07/20/18   Romero BellingEllison, Sean, MD  Multiple Vitamin (MULTIVITAMIN WITH MINERALS) TABS tablet Take 1 tablet by mouth daily.    [provider]  Multiple Vitamin (MULTIVITAMIN) tablet Take 1 tablet by mouth daily.      [provider]  polyethylene glycol powder (GLYCOLAX/MIRALAX) powder TAKE 17 GRAMS BY  MOUTH DAILY 11/29/16   Porfirio OarJeffery, Chelle, PA    Family History Family History  Problem Relation Age of Onset  . Hypothyroidism Mother   . Hypertension Mother   . Cancer Father   . Cancer Maternal Grandmother   . Cancer Maternal Grandfather   . Cancer Paternal Grandmother   . Cancer Paternal Grandfather     Social History Social History   Tobacco Use  . Smoking status: Never Smoker  . Smokeless tobacco: Never Used  Substance Use Topics  . Alcohol use: No  . Drug use: No     Allergies   Erythromycin   Review of Systems Review of Systems   Physical Exam Triage  Vital Signs ED Triage Vitals  Enc Vitals Group     BP 03/30/19 1548 121/70     Pulse Rate 03/30/19 1548 71     Resp 03/30/19 1548 18     Temp 03/30/19 1548 98 F (36.7 C)     Temp Source 03/30/19 1548 Oral     SpO2 03/30/19 1548 100 %     Weight --      Height --      Head Circumference --      Peak Flow --      Pain Score 03/30/19 1546 2     Pain Loc --      Pain Edu? --      Excl. in GC? --    No data found.  Updated Vital Signs BP 121/70 (BP Location: Right Arm)   Pulse 71   Temp 98 F (36.7 C) (Oral)   Resp 18   SpO2 100%    Physical Exam Constitutional:      General: She is not in acute distress.    Appearance: She is well-developed.  HENT:     Ears:     Comments: Left dm dull    Nose:     Right Sinus: Maxillary sinus tenderness and frontal sinus tenderness present.     Left Sinus: Maxillary sinus tenderness and frontal sinus tenderness present.  Cardiovascular:     Rate and Rhythm: Normal rate.  Pulmonary:     Effort: Pulmonary effort is normal.  Skin:    General: Skin is warm and dry.  Neurological:     Mental Status: She is alert and oriented to person, place, and time.      UC Treatments / Results  Labs (all labs ordered are listed, but only abnormal results are displayed) Labs Reviewed - No data to display  EKG None  Radiology No results found.  Procedures Procedures (including critical care time)  Medications Ordered in UC Medications  methylPREDNISolone acetate (DEPO-MEDROL) injection 40 mg (has no administration in time range)    Initial Impression / Assessment and Plan / UC Course  I have reviewed the triage vital signs and the nursing notes.  Pertinent labs & imaging results that were available during my care of the patient were reviewed by me and considered in my medical decision making (see chart for details).     Recurrent sinus symptoms, only a 7 day course of antibiotics for this. Recently augmentin so will switch to a  complete 10 day course of doxy. Depo medrol im provided and discussed allergy treatments, stopping mucinex D. Encouraged follow up with PCP as may need referral or further eval if symptoms persist. Patient verbalized understanding and agreeable to plan.   Final Clinical Impressions(s) / UC Diagnoses   Final diagnoses:  Acute recurrent maxillary  sinusitis     Discharge Instructions     Push fluids to ensure adequate hydration and keep secretions thin.  Daily flonase and allegra. We will do a 10 day course of antibiotics. You can start this today or you can see how you feel over the next two days after the injection we give you here today.  Please continue to follow with your primary care provider for persistent symptoms as you may need referral or further evaluation.    ED Prescriptions    Medication Sig Dispense Auth. Provider   fluticasone (FLONASE) 50 MCG/ACT nasal spray Place 2 sprays into both nostrils daily for 10 days. 16 g Augusto Gamble B, NP   fexofenadine (ALLEGRA) 60 MG tablet Take 1 tablet (60 mg total) by mouth 2 (two) times daily. 60 tablet Augusto Gamble B, NP   doxycycline (VIBRAMYCIN) 100 MG capsule Take 1 capsule (100 mg total) by mouth 2 (two) times daily. 20 capsule Zigmund Gottron, NP     Controlled Substance Prescriptions [place] Controlled Substance Registry consulted? Not Applicable   Zigmund Gottron, NP 03/30/19 1625

## 2019-03-30 NOTE — ED Triage Notes (Signed)
Patient presents to Urgent Care with complaints of sinus drainage that will not clear up despite recently finishing a course of antibiotics. Patient reports she also has left ear pain.

## 2019-03-30 NOTE — Discharge Instructions (Addendum)
Push fluids to ensure adequate hydration and keep secretions thin.  Daily flonase and allegra. We will do a 10 day course of antibiotics. You can start this today or you can see how you feel over the next two days after the injection we give you here today.  Please continue to follow with your primary care provider for persistent symptoms as you may need referral or further evaluation.

## 2019-04-09 DIAGNOSIS — J324 Chronic pansinusitis: Secondary | ICD-10-CM | POA: Insufficient documentation

## 2019-07-23 ENCOUNTER — Ambulatory Visit: Payer: 59 | Admitting: Endocrinology

## 2019-07-24 ENCOUNTER — Other Ambulatory Visit: Payer: Self-pay | Admitting: Endocrinology

## 2019-07-26 ENCOUNTER — Other Ambulatory Visit: Payer: Self-pay

## 2019-07-30 ENCOUNTER — Encounter: Payer: Self-pay | Admitting: Endocrinology

## 2019-07-30 ENCOUNTER — Ambulatory Visit: Payer: 59 | Admitting: Endocrinology

## 2019-07-30 ENCOUNTER — Other Ambulatory Visit: Payer: Self-pay

## 2019-07-30 VITALS — BP 132/80 | HR 68 | Ht 64.5 in | Wt 177.6 lb

## 2019-07-30 DIAGNOSIS — E042 Nontoxic multinodular goiter: Secondary | ICD-10-CM

## 2019-07-30 DIAGNOSIS — E039 Hypothyroidism, unspecified: Secondary | ICD-10-CM

## 2019-07-30 MED ORDER — TRAZODONE HCL 100 MG PO TABS: ORAL_TABLET | ORAL | 3 refills | Status: DC

## 2019-07-30 NOTE — Patient Instructions (Addendum)
Thyroid blood tests are requested for you today.  We'll let you know about the results.   Please come back for a follow-up appointment in 1 year.   

## 2019-07-30 NOTE — Progress Notes (Signed)
Subjective:    Patient ID: Audrey Christian, female    DOB: June 23, 1971, 48 y.o.   MRN: [id number]  HPI Pt returns for chronic primary hyperthyroidism (dx'ed 1999; [place] in 2016 showed normal size gland with tiny nodules--no change).  pt states she feels well in general.   Past Medical History:  Diagnosis Date  . Allergy   . Alopecia areata 05/2002  . Anemia   . ANXIETY 06/03/2007  . ASTHMA 06/03/2007  . Bradycardia    Asymptomatic  . Chronic kidney disease   . HYPOTHYROIDISM 06/03/2007    Past Surgical History:  Procedure Laterality Date  . CHOLECYSTECTOMY  05/2002  . TUBAL LIGATION      Social History   Socioeconomic History  . Marital status: Married    Spouse name: Wilber Oliphant  . Number of children: 1  . Years of education: Not on file  . 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 Financial resource strain: Not on file  . Food insecurity    Worry: Not on file    Inability: Not on file  . Transportation needs    Medical: Not on file    Non-medical: Not on file  Tobacco Use  . Smoking status: Never Smoker  . Smokeless tobacco: Never Used  Substance and Sexual Activity  . Alcohol use: No  . Drug use: No  . Sexual activity: Yes    Birth control/protection: None  Lifestyle  . Physical activity    Days per week: Not on file    Minutes per session: Not on file  . Stress: Not on file  Relationships  . Social Herbalist on phone: Not on file    Gets together: Not on file    Attends religious service: Not on file    Active member of club or organization: Not on file    Attends meetings of clubs or organizations: Not on file    Relationship status: Not on file  . Intimate partner violence    Fear of current or ex partner: Not on file    Emotionally abused: Not on file    Physically abused: Not on file    Forced sexual activity: Not on file  Other Topics Concern   . Not on file  Social History Narrative  . Not on file    Current Outpatient Medications on File Prior to Visit  Medication Sig Dispense Refill  . albuterol (PROVENTIL HFA;VENTOLIN HFA) 108 (90 Base) MCG/ACT inhaler Inhale 2 puffs into the lungs every 4 (four) hours as needed for wheezing or shortness of breath (cough, shortness of breath or wheezing.). 1 Inhaler 1  . Calcium Carbonate-Vitamin D (CALTRATE 600+D) 600-400 MG-UNIT per tablet Take 2 tablets by mouth daily.     . Ciclopirox 1 % shampoo APPLICATIONS APPLY ON THE SKIN ALTERNATE WITH OTC TSAL 2 X WEEK  2  . clobetasol (TEMOVATE) 0.05 % external solution APPLY TO AFFECTED AREA ON SCALP TWICE A DAY AS NEEDED  2  . fexofenadine (ALLEGRA) 60 MG tablet Take 1 tablet (60 mg total) by mouth 2 (two) times daily. 60 tablet 0  . levothyroxine (SYNTHROID) 75 MCG tablet TAKE 1 TABLET BY MOUTH EVERY DAY 90 tablet 3  . Multiple Vitamin (MULTIVITAMIN) tablet Take 1 tablet by mouth daily.      . polyethylene glycol powder (GLYCOLAX/MIRALAX) powder TAKE 17 GRAMS  BY MOUTH DAILY 527 g 0  . fluticasone (FLONASE) 50 MCG/ACT nasal spray Place 2 sprays into both nostrils daily for 10 days. 16 g 0   No current facility-administered medications on file prior to visit.     Allergies  Allergen Reactions  . Erythromycin     REACTION: Upset Stomach    Family History  Problem Relation Age of Onset  . Hypothyroidism Mother   . Hypertension Mother   . Cancer Father   . Cancer Maternal Grandmother   . Cancer Maternal Grandfather   . Cancer Paternal Grandmother   . Cancer Paternal Grandfather     BP 132/80 (BP Location: Left Arm, Patient Position: Sitting, Cuff Size: Normal)   Pulse 68   Ht 5' 4.5" (1.638 m)   Wt 177 lb 9.6 oz (80.6 kg)   SpO2 99%   BMI 30.01 kg/m    Review of Systems Denies neck pain.  Trazodone works well.      Objective:   Physical Exam VITAL SIGNS:  See vs page GENERAL: no distress NECK: There is no palpable thyroid  enlargement.  No thyroid nodule is palpable.  No palpable lymphadenopathy at the anterior neck.        Assessment & Plan:  Hypothyroidism: due for recheck.  Insomnia: well-controlled.  Please continue the same medication.  Patient Instructions  Thyroid blood tests are requested for you today.  We'll let you know about the results.  Please come back for a follow-up appointment in 1 year.

## 2019-08-02 LAB — T4, FREE: Free T4: 1.19 ng/dL (ref 0.60–1.60)

## 2019-08-02 LAB — TSH: TSH: 2.18 u[IU]/mL (ref 0.35–4.50)

## 2019-08-03 ENCOUNTER — Telehealth: Payer: Self-pay | Admitting: Endocrinology

## 2019-08-03 NOTE — Telephone Encounter (Signed)
Please advise 

## 2019-08-03 NOTE — Telephone Encounter (Signed)
Patient requests to be called at ph# [phone] to be given her lab results or to have lab results sent to her via [facility].

## 2019-08-07 ENCOUNTER — Encounter (HOSPITAL_COMMUNITY): Payer: Self-pay | Admitting: Emergency Medicine

## 2019-08-07 ENCOUNTER — Emergency Department (HOSPITAL_COMMUNITY): Payer: 59

## 2019-08-07 ENCOUNTER — Other Ambulatory Visit: Payer: Self-pay

## 2019-08-07 ENCOUNTER — Emergency Department (HOSPITAL_COMMUNITY)
Admission: EM | Admit: 2019-08-07 | Discharge: 2019-08-07 | Disposition: A | Discharge status: Home / Self Care | Payer: 59 | Attending: Emergency Medicine | Admitting: Emergency Medicine

## 2019-08-07 ENCOUNTER — Encounter: Payer: Self-pay | Admitting: Emergency Medicine

## 2019-08-07 ENCOUNTER — Ambulatory Visit: Payer: 59 | Admitting: Emergency Medicine

## 2019-08-07 VITALS — BP 130/79 | HR 66 | Temp 98.1°F | Resp 16 | Ht 64.0 in | Wt 179.0 lb

## 2019-08-07 DIAGNOSIS — R10823 Right lower quadrant rebound abdominal tenderness: Secondary | ICD-10-CM

## 2019-08-07 DIAGNOSIS — K6389 Other specified diseases of intestine: Secondary | ICD-10-CM | POA: Insufficient documentation

## 2019-08-07 DIAGNOSIS — R1031 Right lower quadrant pain: Secondary | ICD-10-CM

## 2019-08-07 DIAGNOSIS — Z79899 Other long term (current) drug therapy: Secondary | ICD-10-CM | POA: Diagnosis not present

## 2019-08-07 DIAGNOSIS — R1030 Lower abdominal pain, unspecified: Secondary | ICD-10-CM

## 2019-08-07 DIAGNOSIS — J45909 Unspecified asthma, uncomplicated: Secondary | ICD-10-CM | POA: Diagnosis not present

## 2019-08-07 DIAGNOSIS — N189 Chronic kidney disease, unspecified: Secondary | ICD-10-CM | POA: Diagnosis not present

## 2019-08-07 HISTORY — DX: Irritable bowel syndrome, unspecified: K58.9

## 2019-08-07 LAB — POC MICROSCOPIC URINALYSIS (UMFC): Mucus: ABSENT

## 2019-08-07 LAB — CBC
HCT: 43.8 % (ref 36.0–46.0)
Hemoglobin: 14 g/dL (ref 12.0–15.0)
MCH: 30.1 pg (ref 26.0–34.0)
MCHC: 32 g/dL (ref 30.0–36.0)
MCV: 94.2 fL (ref 80.0–100.0)
Platelets: 267 10*3/uL (ref 150–400)
RBC: 4.65 MIL/uL (ref 3.87–5.11)
RDW: 12.8 % (ref 11.5–15.5)
WBC: 6.2 10*3/uL (ref 4.0–10.5)
nRBC: 0 % (ref 0.0–0.2)

## 2019-08-07 LAB — POCT URINALYSIS DIP (MANUAL ENTRY)
Bilirubin, UA: NEGATIVE
Glucose, UA: NEGATIVE mg/dL
Ketones, POC UA: NEGATIVE mg/dL
Leukocytes, UA: NEGATIVE
Nitrite, UA: NEGATIVE
Protein Ur, POC: NEGATIVE mg/dL
Spec Grav, UA: 1.01 (ref 1.010–1.025)
Urobilinogen, UA: 0.2 E.U./dL
pH, UA: 6 (ref 5.0–8.0)

## 2019-08-07 LAB — COMPREHENSIVE METABOLIC PANEL
ALT: 34 U/L (ref 0–44)
AST: 28 U/L (ref 15–41)
Albumin: 4.3 g/dL (ref 3.5–5.0)
Alkaline Phosphatase: 71 U/L (ref 38–126)
Anion gap: 8 (ref 5–15)
BUN: 14 mg/dL (ref 6–20)
CO2: 27 mmol/L (ref 22–32)
Calcium: 9.5 mg/dL (ref 8.9–10.3)
Chloride: 104 mmol/L (ref 98–111)
Creatinine, Ser: 0.75 mg/dL (ref 0.44–1.00)
GFR calc Af Amer: >60 mL/min (ref >60)
GFR calc non Af Amer: >60 mL/min (ref >60)
Glucose, Bld: 94 mg/dL (ref 70–99)
Potassium: 3.9 mmol/L (ref 3.5–5.1)
Sodium: 139 mmol/L (ref 135–145)
Total Bilirubin: 0.4 mg/dL (ref 0.3–1.2)
Total Protein: 7.7 g/dL (ref 6.5–8.1)

## 2019-08-07 LAB — URINALYSIS, ROUTINE W REFLEX MICROSCOPIC
Bacteria, UA: NONE SEEN
Bilirubin Urine: NEGATIVE
Glucose, UA: NEGATIVE mg/dL
Ketones, ur: 5 mg/dL — AB
Leukocytes,Ua: NEGATIVE
Nitrite: NEGATIVE
Protein, ur: NEGATIVE mg/dL
Specific Gravity, Urine: 1.003 — ABNORMAL LOW (ref 1.005–1.030)
pH: 6 (ref 5.0–8.0)

## 2019-08-07 LAB — HCG, QUANTITATIVE, PREGNANCY: hCG, Beta Chain, Quant, S: <1 m[IU]/mL (ref <5)

## 2019-08-07 LAB — LIPASE, BLOOD: Lipase: 30 U/L (ref 11–51)

## 2019-08-07 LAB — POCT URINE PREGNANCY: Preg Test, Ur: NEGATIVE

## 2019-08-07 MED ORDER — IBUPROFEN 800 MG PO TABS: 800.0000 mg | ORAL_TABLET | Freq: Three times a day (TID) | ORAL | 0 refills | Status: DC | PRN

## 2019-08-07 MED ORDER — IOHEXOL 300 MG/ML  SOLN
100.0000 mL | Freq: Once | INTRAMUSCULAR | Status: AC | PRN
  Administered 2019-08-07: 100 mL via INTRAVENOUS

## 2019-08-07 MED ORDER — HYDROCODONE-ACETAMINOPHEN 5-325 MG PO TABS: ORAL_TABLET | ORAL | 0 refills | Status: DC

## 2019-08-07 NOTE — Patient Instructions (Addendum)
History and physical findings suggestive of acute appendicitis. Needs CT scan of abdomen and pelvis as well as blood work. Go to emergency department at [facility] for further evaluation and treatment.   Appendicitis, Adult  The appendix is a tube in the body that is shaped like a finger. It is attached to the large intestine. Appendicitis means that this tube is swollen (inflamed). If this is not treated, the tube can tear (rupture). This can lead to a life-threatening infection. This condition can also cause pus to build up in the appendix (abscess). What are the causes? This condition may be caused by something that blocks the appendix. These include:  A ball of poop (stool).  Lymph glands that are bigger than normal. Sometimes the cause is not known. What increases the risk? You are more likely to develop this condition if you are between 40 and 35 years of age. What are the signs or symptoms? Symptoms of this condition include:  Pain around the belly button (navel). ? The pain moves toward the lower right belly (abdomen). ? The pain can get worse with time. ? The pain can get worse if you cough. ? The pain can get worse if you move suddenly.  Tenderness in the lower right belly.  Feeling sick to your stomach (nauseous).  Throwing up (vomiting).  Not feeling hungry (loss of appetite).  A fever.  Having trouble pooping (constipation).  Watery poop (diarrhea).  Not feeling well. How is this treated? Most often, this condition is treated by taking out the appendix (appendectomy). There are two ways to do this:  Open surgery. For this method, the appendix is taken out through a large cut (incision). The cut is made in the lower right belly. This surgery may be used if: ? You have scars from another surgery. ? You have a bleeding condition. ? You are pregnant and will be having your baby soon. ? You have a condition that makes it hard to do the other type of  surgery.  Laparoscopic surgery. For this method, the appendix is taken out through small cuts. Often, this surgery: ? Causes less pain. ? Causes fewer problems. ? Is easier to heal from. If your appendix tears and pus forms:  A drain may be put into the sore. The drain will be used to get rid of the pus.  You may get an antibiotic medicine through an IV line.  Your appendix may or may not need to be taken out. Follow these instructions at home: If you had surgery, follow instructions from your doctor on how to care for yourself at home and how to take care of your cut from surgery. Medicines  Take over-the-counter and prescription medicines only as told by your doctor.  If you were prescribed an antibiotic medicine, take it as told by your doctor. Do not stop taking the antibiotic even if you start to feel better. Eating and drinking Follow instructions from your doctor about what you cannot eat or drink. You may go back to your diet slowly if:  You no longer feel sick to your stomach.  You have stopped throwing up. General instructions  Do not use any products that contain nicotine or tobacco, such as cigarettes, e-cigarettes, and chewing tobacco. If you need help quitting, ask your doctor.  Do not drive or use heavy machinery while taking prescription pain medicine.  Ask your doctor if the medicine you are taking can cause trouble pooping. You may need to take  steps to prevent or treat trouble pooping: ? Drink enough fluid to keep your pee (urine) pale yellow. ? Take over-the-counter or prescription medicines. ? Eat foods that are high in fiber. These include beans, whole grains, and fresh fruits and vegetables. ? Limit foods that are high in fat and sugar. These include fried or sweet foods.  Keep all follow-up visits as told by your doctor. This is important. Contact a doctor if:  There is pus, blood, or a lot of fluid coming from your cut or cuts from surgery.  You are  sick to your stomach or you throw up. Get help right away if:  You have pain in your belly, and the pain is getting worse.  You have a fever.  You have chills.  You are very tired.  You have muscle pain.  You are short of breath. Summary  Appendicitis is swelling of the appendix. The appendix is a tube that is shaped like a finger. It is joined to the large intestine.  This condition may be caused by something that blocks the appendix. This can lead to an infection.  This condition is usually treated by taking out the appendix. This information is not intended to replace advice given to you by your health care provider. Make sure you discuss any questions you have with your health care provider. Document Released: 12/13/2011 Document Revised: 03/08/2018 Document Reviewed: 03/08/2018 Elsevier Patient Education  [employer].    If you have lab work done today you will be contacted with your lab results within the next 2 weeks.  If you have not heard from [place] then please contact us. The fastest way to get your results is to register for My Chart.   IF you received an x-ray today, you will receive an invoice from [facility]. Please contact [facility] at [phone] with questions or concerns regarding your invoice.   IF you received labwork today, you will receive an invoice from [facility]. Please contact [facility] at [phone] with questions or concerns regarding your invoice.   Our billing staff will not be able to assist you with questions regarding bills from these companies.  You will be contacted with the lab results as soon as they are available. The fastest way to get your results is to activate your My Chart account. Instructions are located on the last page of this paperwork. If you have not heard from [place] regarding the results in 2 weeks, please contact this office.

## 2019-08-07 NOTE — Discharge Instructions (Addendum)
Take Motrin and Vicodin for discomfort.  Drink plenty of fluids and follow-up with your doctor if not improving.  Return here if getting worse

## 2019-08-07 NOTE — ED Provider Notes (Signed)
[facility]-EMERGENCY DEPT Provider Note   CSN: [id number] Arrival date & time: 08/07/19  1646     History   Chief Complaint Chief Complaint  Patient presents with  . Abdominal Pain  . sent from PCP to R/o appendicitis    HPI Audrey Christian is a 48 y.o. female.     Patient complains of abdominal pain for a few days.  Her doctor sent her here to rule out appendicitis  The history is provided by the patient. No language interpreter was used.  Abdominal Pain Pain location:  RLQ Pain quality: aching   Pain radiates to:  Does not radiate Pain severity:  Moderate Onset quality:  Sudden Timing:  Constant Progression:  Worsening Chronicity:  New Context: not alcohol use   Relieved by:  Nothing Worsened by:  Nothing Ineffective treatments:  None tried Associated symptoms: no chest pain, no cough, no diarrhea, no fatigue and no hematuria     Past Medical History:  Diagnosis Date  . Allergy   . Alopecia areata 05/2002  . Anemia   . ANXIETY 06/03/2007  . ASTHMA 06/03/2007  . Bradycardia    Asymptomatic  . Chronic kidney disease   . HYPOTHYROIDISM 06/03/2007  . IBS (irritable bowel syndrome)     Patient Active Problem List   Diagnosis Date Noted  . Polyp, corpus uteri 06/12/2015  . Multinodular goiter 04/01/2015  . Iron deficiency anemia 12/13/2012  . Hypothyroidism 06/03/2007  . ASTHMA 06/03/2007    Past Surgical History:  Procedure Laterality Date  . CHOLECYSTECTOMY  05/2002  . TUBAL LIGATION       OB History   No obstetric history on file.      Home Medications    Prior to Admission medications   Medication Sig Start Date End Date Taking? Authorizing Provider  albuterol (PROVENTIL HFA;VENTOLIN HFA) 108 (90 Base) MCG/ACT inhaler Inhale 2 puffs into the lungs every 4 (four) hours as needed for wheezing or shortness of breath (cough, shortness of breath or wheezing.). 10/31/17  Yes Barnett AbuWiseman, [place] D, PA-C  Calcium Carbonate-Vitamin  D (CALTRATE 600+D) 600-400 MG-UNIT per tablet Take 2 tablets by mouth daily.    Yes [provider]  Ciclopirox 1 % shampoo APPLICATIONS APPLY ON THE SKIN ALTERNATE WITH OTC TSAL 2 X WEEK 01/30/18  Yes [provider]  clobetasol (TEMOVATE) 0.05 % external solution APPLY TO AFFECTED AREA ON SCALP TWICE A DAY AS NEEDED 04/04/18  Yes [provider]  fluocinonide (LIDEX) 0.05 % external solution Apply 1 mL topically 2 (two) times daily as needed for irritation. 08/03/19  Yes [provider]  fluticasone (FLONASE) 50 MCG/ACT nasal spray Place 2 sprays into both nostrils daily for 10 days. 03/30/19 08/07/19 Yes Burky, Barron AlvineNatalie B, NP  levothyroxine (SYNTHROID) 75 MCG tablet TAKE 1 TABLET BY MOUTH EVERY DAY 07/24/19  Yes Romero BellingEllison, Sean, MD  Multiple Vitamin (MULTIVITAMIN) tablet Take 1 tablet by mouth daily.     Yes [provider]  traZODone (DESYREL) 100 MG tablet TAKE 1 TABLET BY MOUTH EVERYDAY AT BEDTIME 07/30/19  Yes Romero BellingEllison, Sean, MD  fexofenadine (ALLEGRA) 60 MG tablet Take 1 tablet (60 mg total) by mouth 2 (two) times daily. Patient not taking: Reported on 08/07/2019 03/30/19   Georgetta HaberBurky, Natalie B, NP  HYDROcodone-acetaminophen (NORCO/VICODIN) 5-325 MG tablet Take 1 pill every 6-8 hours for pain not relieved by Motrin alone 08/07/19   Bethann BerkshireZammit, Vonda Harth, MD  ibuprofen (ADVIL) 800 MG tablet Take 1 tablet (800 mg total)  by mouth every 8 (eight) hours as needed for moderate pain. 08/07/19   Bethann Berkshire, MD  polyethylene glycol powder (GLYCOLAX/MIRALAX) powder TAKE 17 GRAMS BY MOUTH DAILY 11/29/16   Porfirio Oar, PA    Family History Family History  Problem Relation Age of Onset  . Hypothyroidism Mother   . Hypertension Mother   . Cancer Father   . Cancer Maternal Grandmother   . Cancer Maternal Grandfather   . Cancer Paternal Grandmother   . Cancer Paternal Grandfather     Social History Social History   Tobacco Use  . Smoking status: Never Smoker  .  Smokeless tobacco: Never Used  Substance Use Topics  . Alcohol use: No  . Drug use: No     Allergies   Erythromycin   Review of Systems Review of Systems  Constitutional: Negative for appetite change and fatigue.  HENT: Negative for congestion, ear discharge and sinus pressure.   Eyes: Negative for discharge.  Respiratory: Negative for cough.   Cardiovascular: Negative for chest pain.  Gastrointestinal: Positive for abdominal pain. Negative for diarrhea.  Genitourinary: Negative for frequency and hematuria.  Musculoskeletal: Negative for back pain.  Skin: Negative for rash.  Neurological: Negative for seizures and headaches.  Psychiatric/Behavioral: Negative for hallucinations.     Physical Exam Updated Vital Signs BP (!) 155/95   Pulse 72   Temp 98.3 F (36.8 C) (Oral)   Resp 15   SpO2 100%   Physical Exam Vitals signs and nursing note reviewed.  Constitutional:      Appearance: She is well-developed.  HENT:     Head: Normocephalic.     Nose: Nose normal.  Eyes:     General: No scleral icterus.    Conjunctiva/sclera: Conjunctivae normal.  Neck:     Musculoskeletal: Neck supple.     Thyroid: No thyromegaly.  Cardiovascular:     Rate and Rhythm: Normal rate and regular rhythm.     Heart sounds: No murmur. No friction rub. No gallop.   Pulmonary:     Breath sounds: No stridor. No wheezing or rales.  Chest:     Chest wall: No tenderness.  Abdominal:     General: There is no distension.     Tenderness: There is abdominal tenderness. There is no rebound.  Musculoskeletal: Normal range of motion.  Lymphadenopathy:     Cervical: No cervical adenopathy.  Skin:    Findings: No erythema or rash.  Neurological:     Mental Status: She is oriented to person, place, and time.     Motor: No abnormal muscle tone.     Coordination: Coordination normal.  Psychiatric:        Behavior: Behavior normal.      ED Treatments / Results  Labs (all labs ordered are  listed, but only abnormal results are displayed) Labs Reviewed  URINALYSIS, ROUTINE W REFLEX MICROSCOPIC - Abnormal; Notable for the following components:      Result Value   Color, Urine STRAW (*)    Specific Gravity, Urine 1.003 (*)    Hgb urine dipstick SMALL (*)    Ketones, ur 5 (*)    All other components within normal limits  LIPASE, BLOOD  COMPREHENSIVE METABOLIC PANEL  CBC  HCG, QUANTITATIVE, PREGNANCY  I-STAT BETA HCG BLOOD, ED ([facility], [facility], [facility] ONLY)    EKG None  Radiology Ct Abdomen Pelvis W Contrast  Result Date: 08/07/2019 CLINICAL DATA:  Abdominal pain. EXAM: CT ABDOMEN AND PELVIS WITH CONTRAST TECHNIQUE: Multidetector CT imaging  of the abdomen and pelvis was performed using the standard protocol following bolus administration of intravenous contrast. CONTRAST:  167mL OMNIPAQUE IOHEXOL 300 MG/ML  SOLN COMPARISON:  None. FINDINGS: Lower chest: The lung bases are clear of acute process. No pleural effusion or pulmonary lesions. The heart is normal in size. No pericardial effusion. The distal esophagus and aorta are unremarkable. Hepatobiliary: No focal hepatic lesions or intrahepatic biliary dilatation. Gallbladder is surgically absent. No common bile duct dilatation. Pancreas: No mass, inflammation or ductal dilatation. Spleen: Normal size.  No focal lesions. Adrenals/Urinary Tract: The adrenal glands are unremarkable and stable. Chronic scarring type changes involving the right kidney along with small renal calculi. The left kidney is unremarkable. No obstructing ureteral calculi or bladder calculi. No worrisome renal lesions. Bladder appears normal. Stomach/Bowel: The stomach, duodenum, small bowel and colon are unremarkable. No acute inflammatory changes, mass lesions or obstructive findings. The terminal ileum and appendix are normal. Along the anti mesenteric border of the sigmoid colon there is a focal area of inflammation surrounding an area of encapsulated fat. Findings  consistent with epiploic appendagitis. This is a benign self-limited process but can certainly be a cause of abdominal pain. Vascular/Lymphatic: The aorta is normal in caliber. No dissection. The branch vessels are patent. The major venous structures are patent. No mesenteric or retroperitoneal mass or adenopathy. Small scattered lymph nodes are noted. Reproductive: The uterus and ovaries are unremarkable. Uterine fibroids noted. Other: No pelvic mass or free pelvic fluid collections. No pelvic adenopathy. Musculoskeletal: No significant bony findings. IMPRESSION: 1. CT findings consistent with epiploic appendagitis along the anti mesenteric border of the sigmoid colon. This is a benign self-limited process. 2. Status post cholecystectomy.  No biliary dilatation. 3. Chronic scarring changes involving the right kidney and right renal calculi but no obstructing ureteral calculi. Electronically Signed   By: Marijo Sanes M.D.   On: 08/07/2019 21:29    Procedures Procedures (including critical care time)  Medications Ordered in ED Medications  iohexol (OMNIPAQUE) 300 MG/ML solution 100 mL (100 mLs Intravenous Contrast Given 08/07/19 2056)     Initial Impression / Assessment and Plan / ED Course  I have reviewed the triage vital signs and the nursing notes.  Pertinent labs & imaging results that were available during my care of the patient were reviewed by me and considered in my medical decision making (see chart for details).        Patient with abdominal pain.  CT scan shows epiploic appendagitis.  Patient told to take Motrin and also given Vicodin and follow-up with her doctor. Final Clinical Impressions(s) / ED Diagnoses   Final diagnoses:  Lower abdominal pain  Epiploic appendagitis    ED Discharge Orders         Ordered    ibuprofen (ADVIL) 800 MG tablet  Every 8 hours PRN     08/07/19 2159    HYDROcodone-acetaminophen (NORCO/VICODIN) 5-325 MG tablet     08/07/19 2159            Milton Ferguson, MD 08/07/19 2203

## 2019-08-07 NOTE — ED Notes (Signed)
Pt verbalized discharge instructions and follow up care. Alert and ambulatory. No IV. Husband is driving pt home  

## 2019-08-07 NOTE — ED Notes (Signed)
Patient transported to CT 

## 2019-08-07 NOTE — ED Triage Notes (Signed)
Pt having abd pains since last week. Ws sent from PCP for CT scan to rule out appendicitis. Pains have gotten worse since Saturday.

## 2019-08-07 NOTE — Progress Notes (Signed)
Audrey Christian 48 y.o.   Chief Complaint  Patient presents with  . Abdominal Pain    per patient started last week - RIGHT lower side radiates to pelvic    HISTORY OF PRESENT ILLNESS: This is a 48 y.o. female complaining of right-sided lower abdominal pain since last week.  Progressively getting worse.  Denies fever or chills.  Denies nausea or vomiting but has decreased appetite.  Past medical history remarkable for left sided oophorectomy due to ovarian torsion.  Denies urinary symptoms.  Denies pregnancy.  Married with last intercourse 1 week ago and without abnormal pain.  Very tender to touch and now starting to hurt when she walks.  HPI   Prior to Admission medications   Medication Sig Start Date End Date Taking? Authorizing Provider  albuterol (PROVENTIL HFA;VENTOLIN HFA) 108 (90 Base) MCG/ACT inhaler Inhale 2 puffs into the lungs every 4 (four) hours as needed for wheezing or shortness of breath (cough, shortness of breath or wheezing.). 10/31/17  Yes Barnett Abu, [place] D, PA-C  Calcium Carbonate-Vitamin D (CALTRATE 600+D) 600-400 MG-UNIT per tablet Take 2 tablets by mouth daily.    Yes [provider]  Ciclopirox 1 % shampoo APPLICATIONS APPLY ON THE SKIN ALTERNATE WITH OTC TSAL 2 X WEEK 01/30/18  Yes [provider]  clobetasol (TEMOVATE) 0.05 % external solution APPLY TO AFFECTED AREA ON SCALP TWICE A DAY AS NEEDED 04/04/18  Yes [provider]  fexofenadine (ALLEGRA) 60 MG tablet Take 1 tablet (60 mg total) by mouth 2 (two) times daily. 03/30/19  Yes Linus Mako B, NP  levothyroxine (SYNTHROID) 75 MCG tablet TAKE 1 TABLET BY MOUTH EVERY DAY 07/24/19  Yes Romero Belling, MD  Multiple Vitamin (MULTIVITAMIN) tablet Take 1 tablet by mouth daily.     Yes [provider]  polyethylene glycol powder (GLYCOLAX/MIRALAX) powder TAKE 17 GRAMS BY MOUTH DAILY 11/29/16  Yes Jeffery, Chelle, PA  traZODone (DESYREL) 100 MG tablet TAKE 1 TABLET BY MOUTH EVERYDAY  AT BEDTIME 07/30/19  Yes Romero Belling, MD  fluticasone [facility]) 50 MCG/ACT nasal spray Place 2 sprays into both nostrils daily for 10 days. 03/30/19 04/09/19  Georgetta Haber, NP    Allergies  Allergen Reactions  . Erythromycin     REACTION: Upset Stomach    Patient Active Problem List   Diagnosis Date Noted  . Polyp, corpus uteri 06/12/2015  . Multinodular goiter 04/01/2015  . Iron deficiency anemia 12/13/2012  . Hypothyroidism 06/03/2007  . ASTHMA 06/03/2007    Past Medical History:  Diagnosis Date  . Allergy   . Alopecia areata 05/2002  . Anemia   . ANXIETY 06/03/2007  . ASTHMA 06/03/2007  . Bradycardia    Asymptomatic  . Chronic kidney disease   . HYPOTHYROIDISM 06/03/2007    Past Surgical History:  Procedure Laterality Date  . CHOLECYSTECTOMY  05/2002  . TUBAL LIGATION      Social History   Socioeconomic History  . Marital status: Married    Spouse name: Les Pou  . Number of children: 1  . Years of education: Not on file  . 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 Financial resource strain: Not on file  . Food insecurity    Worry: Not on file    Inability: Not on file  . Transportation needs    Medical: Not on file    Non-medical: Not on  file  Tobacco Use  . Smoking status: Never Smoker  . Smokeless tobacco: Never Used  Substance and Sexual Activity  . Alcohol use: No  . Drug use: No  . Sexual activity: Yes    Birth control/protection: None  Lifestyle  . Physical activity    Days per week: Not on file    Minutes per session: Not on file  . Stress: Not on file  Relationships  . Social Herbalist on phone: Not on file    Gets together: Not on file    Attends religious service: Not on file    Active member of club or organization: Not on file    Attends meetings of clubs or organizations: Not on file    Relationship status: Not on file   . Intimate partner violence    Fear of current or ex partner: Not on file    Emotionally abused: Not on file    Physically abused: Not on file    Forced sexual activity: Not on file  Other Topics Concern  . Not on file  Social History Narrative  . Not on file    Family History  Problem Relation Age of Onset  . Hypothyroidism Mother   . Hypertension Mother   . Cancer Father   . Cancer Maternal Grandmother   . Cancer Maternal Grandfather   . Cancer Paternal Grandmother   . Cancer Paternal Grandfather      ROS   Physical Exam Vitals signs reviewed.  Constitutional:      Appearance: Normal appearance.  HENT:     Head: Normocephalic.  Eyes:     Extraocular Movements: Extraocular movements intact.     Pupils: Pupils are equal, round, and reactive to light.  Neck:     Musculoskeletal: Normal range of motion.  Cardiovascular:     Rate and Rhythm: Normal rate and regular rhythm.     Heart sounds: Normal heart sounds.  Pulmonary:     Effort: Pulmonary effort is normal.     Breath sounds: Normal breath sounds.  Abdominal:     Palpations: Abdomen is soft.     Tenderness: There is abdominal tenderness in the right lower quadrant. There is guarding and rebound. There is no right CVA tenderness or left CVA tenderness. Positive signs include McBurney's sign.  Musculoskeletal: Normal range of motion.  Skin:    General: Skin is warm and dry.     Capillary Refill: Capillary refill takes less than 2 seconds.  Neurological:     General: No focal deficit present.     Mental Status: She is alert and oriented to person, place, and time.  Psychiatric:        Mood and Affect: Mood normal.        Behavior: Behavior normal.      Results for orders placed or performed in visit on 08/07/19 (from the past 24 hour(s))  POCT urinalysis dipstick     Status: Abnormal   Collection Time: 08/07/19  4:12 PM  Result Value Ref Range   Color, UA yellow yellow   Clarity, UA clear clear    Glucose, UA negative negative mg/dL   Bilirubin, UA negative negative   Ketones, POC UA negative negative mg/dL   Spec Grav, UA 1.010 1.010 - 1.025   Blood, UA small (A) negative   pH, UA 6.0 5.0 - 8.0   Protein Ur, POC negative negative mg/dL   Urobilinogen, UA 0.2 0.2 or 1.0 E.U./dL  Nitrite, UA Negative Negative   Leukocytes, UA Negative Negative  POCT urine pregnancy     Status: None   Collection Time: 08/07/19  4:13 PM  Result Value Ref Range   Preg Test, Ur Negative Negative  POCT Microscopic Urinalysis ([facility])     Status: Abnormal   Collection Time: 08/07/19  4:16 PM  Result Value Ref Range   WBC,UR,HPF,POC None None WBC/hpf   RBC,UR,HPF,POC None None RBC/hpf   Bacteria None None, Too numerous to count   Mucus Absent Absent   Epithelial Cells, UR Per Microscopy Few (A) None, Too numerous to count cells/hpf     ASSESSMENT & PLAN: Dalaysia was seen today for abdominal pain.  Diagnoses and all orders for this visit:  Right lower quadrant abdominal pain Comments: Suspected appendicitis Orders: -     Cancel: POCT CBC -     POCT Microscopic Urinalysis ([facility]) -     POCT urinalysis dipstick -     POCT urine pregnancy  Right lower quadrant abdominal tenderness with rebound tenderness  Differential diagnosis: Acute appendicitis, ovarian torsion, terminal ileitis, mesenteric adenitis, urolithiasis, ectopic pregnancy, TOA, PID (less likely).  Patient Instructions    History and physical findings suggestive of acute appendicitis. Needs CT scan of abdomen and pelvis as well as blood work. Go to emergency department at [facility] for further evaluation and treatment.   Appendicitis, Adult  The appendix is a tube in the body that is shaped like a finger. It is attached to the large intestine. Appendicitis means that this tube is swollen (inflamed). If this is not treated, the tube can tear (rupture). This can lead to a life-threatening infection. This condition can  also cause pus to build up in the appendix (abscess). What are the causes? This condition may be caused by something that blocks the appendix. These include:  A ball of poop (stool).  Lymph glands that are bigger than normal. Sometimes the cause is not known. What increases the risk? You are more likely to develop this condition if you are between 57 and 10 years of age. What are the signs or symptoms? Symptoms of this condition include:  Pain around the belly button (navel). ? The pain moves toward the lower right belly (abdomen). ? The pain can get worse with time. ? The pain can get worse if you cough. ? The pain can get worse if you move suddenly.  Tenderness in the lower right belly.  Feeling sick to your stomach (nauseous).  Throwing up (vomiting).  Not feeling hungry (loss of appetite).  A fever.  Having trouble pooping (constipation).  Watery poop (diarrhea).  Not feeling well. How is this treated? Most often, this condition is treated by taking out the appendix (appendectomy). There are two ways to do this:  Open surgery. For this method, the appendix is taken out through a large cut (incision). The cut is made in the lower right belly. This surgery may be used if: ? You have scars from another surgery. ? You have a bleeding condition. ? You are pregnant and will be having your baby soon. ? You have a condition that makes it hard to do the other type of surgery.  Laparoscopic surgery. For this method, the appendix is taken out through small cuts. Often, this surgery: ? Causes less pain. ? Causes fewer problems. ? Is easier to heal from. If your appendix tears and pus forms:  A drain may be put into the sore. The drain will  be used to get rid of the pus.  You may get an antibiotic medicine through an IV line.  Your appendix may or may not need to be taken out. Follow these instructions at home: If you had surgery, follow instructions from your doctor on  how to care for yourself at home and how to take care of your cut from surgery. Medicines  Take over-the-counter and prescription medicines only as told by your doctor.  If you were prescribed an antibiotic medicine, take it as told by your doctor. Do not stop taking the antibiotic even if you start to feel better. Eating and drinking Follow instructions from your doctor about what you cannot eat or drink. You may go back to your diet slowly if:  You no longer feel sick to your stomach.  You have stopped throwing up. General instructions  Do not use any products that contain nicotine or tobacco, such as cigarettes, e-cigarettes, and chewing tobacco. If you need help quitting, ask your doctor.  Do not drive or use heavy machinery while taking prescription pain medicine.  Ask your doctor if the medicine you are taking can cause trouble pooping. You may need to take steps to prevent or treat trouble pooping: ? Drink enough fluid to keep your pee (urine) pale yellow. ? Take over-the-counter or prescription medicines. ? Eat foods that are high in fiber. These include beans, whole grains, and fresh fruits and vegetables. ? Limit foods that are high in fat and sugar. These include fried or sweet foods.  Keep all follow-up visits as told by your doctor. This is important. Contact a doctor if:  There is pus, blood, or a lot of fluid coming from your cut or cuts from surgery.  You are sick to your stomach or you throw up. Get help right away if:  You have pain in your belly, and the pain is getting worse.  You have a fever.  You have chills.  You are very tired.  You have muscle pain.  You are short of breath. Summary  Appendicitis is swelling of the appendix. The appendix is a tube that is shaped like a finger. It is joined to the large intestine.  This condition may be caused by something that blocks the appendix. This can lead to an infection.  This condition is usually  treated by taking out the appendix. This information is not intended to replace advice given to you by your health care provider. Make sure you discuss any questions you have with your health care provider. Document Released: 12/13/2011 Document Revised: 03/08/2018 Document Reviewed: 03/08/2018 Elsevier Patient Education  [employer]2020 [employer]    If you have lab work done today you will be contacted with your lab results within the next 2 weeks.  If you have not heard from us then please contact us. The fastest way to get your results is to register for My Chart.   IF you received an x-ray today, you will receive an invoice from [facility]. Please contact [facility] at [phone] with questions or concerns regarding your invoice.   IF you received labwork today, you will receive an invoice from [facility]. Please contact [facility] at [phone] with questions or concerns regarding your invoice.   Our billing staff will not be able to assist you with questions regarding bills from these companies.  You will be contacted with the lab results as soon as they are available. The fastest way to get your results is to activate your  My Chart account. Instructions are located on the last page of this paperwork. If you have not heard from [place] regarding the results in 2 weeks, please contact this office.         Edwina Barth, MD [facility]

## 2020-01-01 LAB — TSH: TSH: 3.12 (ref 0.41–5.90)

## 2020-02-21 ENCOUNTER — Encounter: Payer: Self-pay | Admitting: Emergency Medicine

## 2020-02-21 ENCOUNTER — Other Ambulatory Visit: Payer: Self-pay

## 2020-02-21 ENCOUNTER — Ambulatory Visit: Payer: 59 | Admitting: Emergency Medicine

## 2020-02-21 VITALS — BP 104/68 | HR 71 | Temp 98.3°F | Resp 16 | Ht 64.0 in | Wt 182.0 lb

## 2020-02-21 DIAGNOSIS — Z87898 Personal history of other specified conditions: Secondary | ICD-10-CM

## 2020-02-21 DIAGNOSIS — Z8709 Personal history of other diseases of the respiratory system: Secondary | ICD-10-CM

## 2020-02-21 DIAGNOSIS — Z872 Personal history of diseases of the skin and subcutaneous tissue: Secondary | ICD-10-CM

## 2020-02-21 DIAGNOSIS — Z87448 Personal history of other diseases of urinary system: Secondary | ICD-10-CM | POA: Diagnosis not present

## 2020-02-21 DIAGNOSIS — E039 Hypothyroidism, unspecified: Secondary | ICD-10-CM

## 2020-02-21 DIAGNOSIS — Z8614 Personal history of Methicillin resistant Staphylococcus aureus infection: Secondary | ICD-10-CM

## 2020-02-21 DIAGNOSIS — Z8719 Personal history of other diseases of the digestive system: Secondary | ICD-10-CM

## 2020-02-21 DIAGNOSIS — Z7689 Persons encountering health services in other specified circumstances: Secondary | ICD-10-CM

## 2020-02-21 MED ORDER — ALBUTEROL SULFATE HFA 108 (90 BASE) MCG/ACT IN AERS: 2.0000 | INHALATION_SPRAY | RESPIRATORY_TRACT | 5 refills | Status: DC | PRN

## 2020-02-21 NOTE — Progress Notes (Signed)
Audrey Christian 49 y.o.   Chief Complaint  Patient presents with  . Establish Care    HISTORY OF PRESENT ILLNESS: This is a 49 y.o. female here to establish care with me.  Has the following chronic medical problems: 1.  Hypothyroidism: On Synthroid 75 mcg daily. 2.  History of left kidney issues.  Normal last kidney function test. 3.  Alopecia related to stress.  No issues at present time. 4.  History of IBS.  Well-controlled. 5.  History of asthma.  Needs albuterol refill. 6.  History of MRSA infections. No Covid infection no Covid vaccine. No complaints or medical concerns today.  HPI   Prior to Admission medications   Medication Sig Start Date End Date Taking? Authorizing Provider  albuterol (PROVENTIL HFA;VENTOLIN HFA) 108 (90 Base) MCG/ACT inhaler Inhale 2 puffs into the lungs every 4 (four) hours as needed for wheezing or shortness of breath (cough, shortness of breath or wheezing.). 10/31/17   Benjiman Core D, PA-C  Calcium Carbonate-Vitamin D (CALTRATE 600+D) 600-400 MG-UNIT per tablet Take 2 tablets by mouth daily.     [provider]  Ciclopirox 1 % shampoo APPLICATIONS APPLY ON THE SKIN ALTERNATE WITH OTC TSAL 2 X WEEK 01/30/18   [provider]  clobetasol (TEMOVATE) 0.05 % external solution APPLY TO AFFECTED AREA ON SCALP TWICE A DAY AS NEEDED 04/04/18   [provider]  fexofenadine (ALLEGRA) 60 MG tablet Take 1 tablet (60 mg total) by mouth 2 (two) times daily. Patient not taking: Reported on 08/07/2019 03/30/19   Linus Mako B, NP  fluocinonide (LIDEX) 0.05 % external solution Apply 1 mL topically 2 (two) times daily as needed for irritation. 08/03/19   [provider]  fluticasone (FLONASE) 50 MCG/ACT nasal spray Place 2 sprays into both nostrils daily for 10 days. 03/30/19 08/07/19  Georgetta Haber, NP  HYDROcodone-acetaminophen (NORCO/VICODIN) 5-325 MG tablet Take 1 pill every 6-8 hours for pain not relieved by Motrin alone  08/07/19   Bethann Berkshire, MD  ibuprofen (ADVIL) 800 MG tablet Take 1 tablet (800 mg total) by mouth every 8 (eight) hours as needed for moderate pain. 08/07/19   Bethann Berkshire, MD  levothyroxine (SYNTHROID) 75 MCG tablet TAKE 1 TABLET BY MOUTH EVERY DAY 07/24/19   Romero Belling, MD  Multiple Vitamin (MULTIVITAMIN) tablet Take 1 tablet by mouth daily.      [provider]  polyethylene glycol powder (GLYCOLAX/MIRALAX) powder TAKE 17 GRAMS BY MOUTH DAILY 11/29/16   Porfirio Oar, PA  traZODone (DESYREL) 100 MG tablet TAKE 1 TABLET BY MOUTH EVERYDAY AT BEDTIME 07/30/19   Romero Belling, MD    Allergies  Allergen Reactions  . Erythromycin     REACTION: Upset Stomach    Patient Active Problem List   Diagnosis Date Noted  . Polyp, corpus uteri 06/12/2015  . Multinodular goiter 04/01/2015  . Iron deficiency anemia 12/13/2012  . Hypothyroidism 06/03/2007  . ASTHMA 06/03/2007    Past Medical History:  Diagnosis Date  . Allergy   . Alopecia areata 05/2002  . Anemia   . ANXIETY 06/03/2007  . ASTHMA 06/03/2007  . Bradycardia    Asymptomatic  . Chronic kidney disease   . HYPOTHYROIDISM 06/03/2007  . IBS (irritable bowel syndrome)     Past Surgical History:  Procedure Laterality Date  . CHOLECYSTECTOMY  05/2002  . TUBAL LIGATION      Social History   Socioeconomic History  . Marital status: Married    Spouse name: Audrey Christian  .  Number of children: 1  . Years of education: Not on file  . 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 Smoking status: Never Smoker  . Smokeless tobacco: Never Used  Substance and Sexual Activity  . Alcohol use: No  . Drug use: No  . Sexual activity: Yes    Birth control/protection: None  Other Topics Concern  . Not on file  Social History Narrative  . Not on file   Social Determinants of Health   Financial Resource Strain:   . Difficulty of  Paying Living Expenses:   Food Insecurity:   . Worried About Programme researcher, broadcasting/film/video in the Last Year:   . Barista in the Last Year:   Transportation Needs:   . Freight forwarder (Medical):   Marland Kitchen Lack of Transportation (Non-Medical):   Physical Activity:   . Days of Exercise per Week:   . Minutes of Exercise per Session:   Stress:   . Feeling of Stress :   Social Connections:   . Frequency of Communication with Friends and Family:   . Frequency of Social Gatherings with Friends and Family:   . Attends Religious Services:   . Active Member of Clubs or Organizations:   . Attends Banker Meetings:   Marland Kitchen Marital Status:   Intimate Partner Violence:   . Fear of Current or Ex-Partner:   . Emotionally Abused:   Marland Kitchen Physically Abused:   . Sexually Abused:     Family History  Problem Relation Age of Onset  . Hypothyroidism Mother   . Hypertension Mother   . Cancer Father   . Cancer Maternal Grandmother   . Cancer Maternal Grandfather   . Cancer Paternal Grandmother   . Cancer Paternal Grandfather      Review of Systems  Constitutional: Negative.  Negative for chills and fever.  HENT: Negative.  Negative for congestion and sore throat.   Respiratory: Negative.  Negative for cough and shortness of breath.   Cardiovascular: Negative.  Negative for chest pain and palpitations.  Gastrointestinal: Negative.  Negative for abdominal pain, blood in stool, constipation, diarrhea, melena, nausea and vomiting.  Genitourinary: Negative.  Negative for dysuria and hematuria.  Musculoskeletal: Negative.  Negative for back pain, myalgias and neck pain.  Skin: Negative.  Negative for rash.  Neurological: Negative.  Negative for dizziness and headaches.  All other systems reviewed and are negative.   Today's Vitals   02/21/20 1423  BP: 104/68  Pulse: 71  Resp: 16  Temp: 98.3 F (36.8 C)  TempSrc: Temporal  SpO2: 100%  Weight: 182 lb (82.6 kg)  Height: 5\' 4"  (1.626 m)     Body mass index is 31.24 kg/m.  Physical Exam Vitals reviewed.  Constitutional:      Appearance: Normal appearance.  HENT:     Head: Normocephalic.  Eyes:     Extraocular Movements: Extraocular movements intact.     Pupils: Pupils are equal, round, and reactive to light.  Cardiovascular:     Rate and Rhythm: Normal rate.  Pulmonary:     Effort: Pulmonary effort is normal.  Musculoskeletal:        General: Normal range of motion.     Cervical back: Normal range of motion.  Skin:    General: Skin is warm and dry.     Capillary Refill: Capillary refill takes less  than 2 seconds.  Neurological:     General: No focal deficit present.     Mental Status: She is alert and oriented to person, place, and time.  Psychiatric:        Mood and Affect: Mood normal.        Behavior: Behavior normal.      ASSESSMENT & PLAN: Audrey Christian was seen today for establish care.  Diagnoses and all orders for this visit:  Hypothyroidism, unspecified type  History of asthma -     albuterol (VENTOLIN HFA) 108 (90 Base) MCG/ACT inhaler; Inhale 2 puffs into the lungs every 4 (four) hours as needed for wheezing or shortness of breath (cough, shortness of breath or wheezing.).  History of alopecia  History of kidney problems  History of IBS  Encounter to establish care  History of MRSA infection     Patient Instructions       If you have lab work done today you will be contacted with your lab results within the next 2 weeks.  If you have not heard from [place] then please contact us. The fastest way to get your results is to register for My Chart.   IF you received an x-ray today, you will receive an invoice from [facility]. Please contact [facility] at [phone] with questions or concerns regarding your invoice.   IF you received labwork today, you will receive an invoice from [facility]. Please contact [facility] at [phone] with questions or concerns regarding your  invoice.   Our billing staff will not be able to assist you with questions regarding bills from these companies.  You will be contacted with the lab results as soon as they are available. The fastest way to get your results is to activate your My Chart account. Instructions are located on the last page of this paperwork. If you have not heard from [place] regarding the results in 2 weeks, please contact this office.     Health Maintenance, Female Adopting a healthy lifestyle and getting preventive care are important in promoting health and wellness. Ask your health care provider about:  The right schedule for you to have regular tests and exams.  Things you can do on your own to prevent diseases and keep yourself healthy. What should I know about diet, weight, and exercise? Eat a healthy diet   Eat a diet that includes plenty of vegetables, fruits, low-fat dairy products, and lean protein.  Do not eat a lot of foods that are high in solid fats, added sugars, or sodium. Maintain a healthy weight Body mass index (BMI) is used to identify weight problems. It estimates body fat based on height and weight. Your health care provider can help determine your BMI and help you achieve or maintain a healthy weight. Get regular exercise Get regular exercise. This is one of the most important things you can do for your health. Most adults should:  Exercise for at least 150 minutes each week. The exercise should increase your heart rate and make you sweat (moderate-intensity exercise).  Do strengthening exercises at least twice a week. This is in addition to the moderate-intensity exercise.  Spend less time sitting. Even light physical activity can be beneficial. Watch cholesterol and blood lipids Have your blood tested for lipids and cholesterol at 50 years of age, then have this test every 5 years. Have your cholesterol levels checked more often if:  Your lipid or cholesterol levels are  high.  You are older than 50 years  of age.  You are at high risk for heart disease. What should I know about cancer screening? Depending on your health history and family history, you may need to have cancer screening at various ages. This may include screening for:  Breast cancer.  Cervical cancer.  Colorectal cancer.  Skin cancer.  Lung cancer. What should I know about heart disease, diabetes, and high blood pressure? Blood pressure and heart disease  High blood pressure causes heart disease and increases the risk of stroke. This is more likely to develop in people who have high blood pressure readings, are of African descent, or are overweight.  Have your blood pressure checked: ? Every 3-5 years if you are 77-68 years of age. ? Every year if you are 2 years old or older. Diabetes Have regular diabetes screenings. This checks your fasting blood sugar level. Have the screening done:  Once every three years after age 63 if you are at a normal weight and have a low risk for diabetes.  More often and at a younger age if you are overweight or have a high risk for diabetes. What should I know about preventing infection? Hepatitis B If you have a higher risk for hepatitis B, you should be screened for this virus. Talk with your health care provider to find out if you are at risk for hepatitis B infection. Hepatitis C Testing is recommended for:  Everyone born from [birth year] through 1966.  Anyone with known risk factors for hepatitis C. Sexually transmitted infections (STIs)  Get screened for STIs, including gonorrhea and chlamydia, if: ? You are sexually active and are younger than 50 years of age. ? You are older than 50 years of age and your health care provider tells you that you are at risk for this type of infection. ? Your sexual activity has changed since you were last screened, and you are at increased risk for chlamydia or gonorrhea. Ask your health care provider if you  are at risk.  Ask your health care provider about whether you are at high risk for HIV. Your health care provider may recommend a prescription medicine to help prevent HIV infection. If you choose to take medicine to prevent HIV, you should first get tested for HIV. You should then be tested every 3 months for as long as you are taking the medicine. Pregnancy  If you are about to stop having your period (premenopausal) and you may become pregnant, seek counseling before you get pregnant.  Take 400 to 800 micrograms (mcg) of folic acid every day if you become pregnant.  Ask for birth control (contraception) if you want to prevent pregnancy. Osteoporosis and menopause Osteoporosis is a disease in which the bones lose minerals and strength with aging. This can result in bone fractures. If you are 76 years old or older, or if you are at risk for osteoporosis and fractures, ask your health care provider if you should:  Be screened for bone loss.  Take a calcium or vitamin D supplement to lower your risk of fractures.  Be given hormone replacement therapy (HRT) to treat symptoms of menopause. Follow these instructions at home: Lifestyle  Do not use any products that contain nicotine or tobacco, such as cigarettes, e-cigarettes, and chewing tobacco. If you need help quitting, ask your health care provider.  Do not use street drugs.  Do not share needles.  Ask your health care provider for help if you need support or information about quitting drugs. Alcohol use  Do not drink alcohol if: ? Your health care provider tells you not to drink. ? You are pregnant, may be pregnant, or are planning to become pregnant.  If you drink alcohol: ? Limit how much you use to 0-1 drink a day. ? Limit intake if you are breastfeeding.  Be aware of how much alcohol is in your drink. In the [place] one drink equals one 12 oz bottle of beer (355 mL), one 5 oz glass of wine (148 mL), or one 1 oz glass of hard  liquor (44 mL). General instructions  Schedule regular health, dental, and eye exams.  Stay current with your vaccines.  Tell your health care provider if: ? You often feel depressed. ? You have ever been abused or do not feel safe at home. Summary  Adopting a healthy lifestyle and getting preventive care are important in promoting health and wellness.  Follow your health care provider's instructions about healthy diet, exercising, and getting tested or screened for diseases.  Follow your health care provider's instructions on monitoring your cholesterol and blood pressure. This information is not intended to replace advice given to you by your health care provider. Make sure you discuss any questions you have with your health care provider. Document Revised: 09/13/2018 Document Reviewed: 09/13/2018 Elsevier Patient Education  2020 Elsevier Inc.      Edwina BarthMiguel Jaxston Chohan, MD [facility]

## 2020-02-21 NOTE — Patient Instructions (Addendum)
   If you have lab work done today you will be contacted with your lab results within the next 2 weeks.  If you have not heard from us then please contact us. The fastest way to get your results is to register for My Chart.   IF you received an x-ray today, you will receive an invoice from [facility]. Please contact [facility] at [phone] with questions or concerns regarding your invoice.   IF you received labwork today, you will receive an invoice from [facility]. Please contact [facility] at [phone] with questions or concerns regarding your invoice.   Our billing staff will not be able to assist you with questions regarding bills from these companies.  You will be contacted with the lab results as soon as they are available. The fastest way to get your results is to activate your My Chart account. Instructions are located on the last page of this paperwork. If you have not heard from us regarding the results in 2 weeks, please contact this office.       Health Maintenance, Female Adopting a healthy lifestyle and getting preventive care are important in promoting health and wellness. Ask your health care provider about:  The right schedule for you to have regular tests and exams.  Things you can do on your own to prevent diseases and keep yourself healthy. What should I know about diet, weight, and exercise? Eat a healthy diet   Eat a diet that includes plenty of vegetables, fruits, low-fat dairy products, and lean protein.  Do not eat a lot of foods that are high in solid fats, added sugars, or sodium. Maintain a healthy weight Body mass index (BMI) is used to identify weight problems. It estimates body fat based on height and weight. Your health care provider can help determine your BMI and help you achieve or maintain a healthy weight. Get regular exercise Get regular exercise. This is one of the most important things you can do for your health. Most  adults should:  Exercise for at least 150 minutes each week. The exercise should increase your heart rate and make you sweat (moderate-intensity exercise).  Do strengthening exercises at least twice a week. This is in addition to the moderate-intensity exercise.  Spend less time sitting. Even light physical activity can be beneficial. Watch cholesterol and blood lipids Have your blood tested for lipids and cholesterol at 50 years of age, then have this test every 5 years. Have your cholesterol levels checked more often if:  Your lipid or cholesterol levels are high.  You are older than 50 years of age.  You are at high risk for heart disease. What should I know about cancer screening? Depending on your health history and family history, you may need to have cancer screening at various ages. This may include screening for:  Breast cancer.  Cervical cancer.  Colorectal cancer.  Skin cancer.  Lung cancer. What should I know about heart disease, diabetes, and high blood pressure? Blood pressure and heart disease  High blood pressure causes heart disease and increases the risk of stroke. This is more likely to develop in people who have high blood pressure readings, are of African descent, or are overweight.  Have your blood pressure checked: ? Every 3-5 years if you are 18-39 years of age. ? Every year if you are 40 years old or older. Diabetes Have regular diabetes screenings. This checks your fasting blood sugar level. Have the screening done:  Once   every three years after age 40 if you are at a normal weight and have a low risk for diabetes.  More often and at a younger age if you are overweight or have a high risk for diabetes. What should I know about preventing infection? Hepatitis B If you have a higher risk for hepatitis B, you should be screened for this virus. Talk with your health care provider to find out if you are at risk for hepatitis B infection. Hepatitis  C Testing is recommended for:  Everyone born from 1945 through 1965.  Anyone with known risk factors for hepatitis C. Sexually transmitted infections (STIs)  Get screened for STIs, including gonorrhea and chlamydia, if: ? You are sexually active and are younger than 50 years of age. ? You are older than 50 years of age and your health care provider tells you that you are at risk for this type of infection. ? Your sexual activity has changed since you were last screened, and you are at increased risk for chlamydia or gonorrhea. Ask your health care provider if you are at risk.  Ask your health care provider about whether you are at high risk for HIV. Your health care provider may recommend a prescription medicine to help prevent HIV infection. If you choose to take medicine to prevent HIV, you should first get tested for HIV. You should then be tested every 3 months for as long as you are taking the medicine. Pregnancy  If you are about to stop having your period (premenopausal) and you may become pregnant, seek counseling before you get pregnant.  Take 400 to 800 micrograms (mcg) of folic acid every day if you become pregnant.  Ask for birth control (contraception) if you want to prevent pregnancy. Osteoporosis and menopause Osteoporosis is a disease in which the bones lose minerals and strength with aging. This can result in bone fractures. If you are 65 years old or older, or if you are at risk for osteoporosis and fractures, ask your health care provider if you should:  Be screened for bone loss.  Take a calcium or vitamin D supplement to lower your risk of fractures.  Be given hormone replacement therapy (HRT) to treat symptoms of menopause. Follow these instructions at home: Lifestyle  Do not use any products that contain nicotine or tobacco, such as cigarettes, e-cigarettes, and chewing tobacco. If you need help quitting, ask your health care provider.  Do not use street  drugs.  Do not share needles.  Ask your health care provider for help if you need support or information about quitting drugs. Alcohol use  Do not drink alcohol if: ? Your health care provider tells you not to drink. ? You are pregnant, may be pregnant, or are planning to become pregnant.  If you drink alcohol: ? Limit how much you use to 0-1 drink a day. ? Limit intake if you are breastfeeding.  Be aware of how much alcohol is in your drink. In the [place] one drink equals one 12 oz bottle of beer (355 mL), one 5 oz glass of wine (148 mL), or one 1 oz glass of hard liquor (44 mL). General instructions  Schedule regular health, dental, and eye exams.  Stay current with your vaccines.  Tell your health care provider if: ? You often feel depressed. ? You have ever been abused or do not feel safe at home. Summary  Adopting a healthy lifestyle and getting preventive care are important in promoting health and   wellness.  Follow your health care provider's instructions about healthy diet, exercising, and getting tested or screened for diseases.  Follow your health care provider's instructions on monitoring your cholesterol and blood pressure. This information is not intended to replace advice given to you by your health care provider. Make sure you discuss any questions you have with your health care provider. Document Revised: 09/13/2018 Document Reviewed: 09/13/2018 Elsevier Patient Education  2020 Elsevier Inc.  

## 2020-04-04 ENCOUNTER — Other Ambulatory Visit: Payer: Self-pay

## 2020-04-04 ENCOUNTER — Encounter: Payer: Self-pay | Admitting: Family Medicine

## 2020-04-04 ENCOUNTER — Ambulatory Visit: Payer: 59 | Admitting: Family Medicine

## 2020-04-04 VITALS — BP 122/76 | HR 60 | Temp 97.6°F | Ht 64.0 in | Wt 189.2 lb

## 2020-04-04 DIAGNOSIS — R590 Localized enlarged lymph nodes: Secondary | ICD-10-CM

## 2020-04-04 DIAGNOSIS — H6992 Unspecified Eustachian tube disorder, left ear: Secondary | ICD-10-CM

## 2020-04-04 DIAGNOSIS — H6982 Other specified disorders of Eustachian tube, left ear: Secondary | ICD-10-CM

## 2020-04-04 NOTE — Patient Instructions (Addendum)
  Do nothing about the lymph gland in the left side of your neck at this time.  However if it is not shrinking down over the next 3 or 4 weeks return for it to be rechecked.  If it is getting worse or giving you problems in the meanwhile come in sooner.  Also check your axillary and inguinal regions for lymph glands.  You probably have mild eustachian tube dysfunction in that left ear.  Use your Flonase spray 2 sprays each nostril twice daily for a couple of days and then reduce to once daily over the next week.  If problems persist get rechecked.  Return if problems  I do encourage you to reconsider and get your COVID-19 vaccine.   If you have lab work done today you will be contacted with your lab results within the next 2 weeks.  If you have not heard from [place] then please contact us. The fastest way to get your results is to register for My Chart.   IF you received an x-ray today, you will receive an invoice from [facility]. Please contact [facility] at [phone] with questions or concerns regarding your invoice.   IF you received labwork today, you will receive an invoice from [facility]. Please contact [facility] at [phone] with questions or concerns regarding your invoice.   Our billing staff will not be able to assist you with questions regarding bills from these companies.  You will be contacted with the lab results as soon as they are available. The fastest way to get your results is to activate your My Chart account. Instructions are located on the last page of this paperwork. If you have not heard from [place] regarding the results in 2 weeks, please contact this office.

## 2020-04-04 NOTE — Progress Notes (Signed)
Patient ID: [name], female    DOB: 06-06-1971  Age: 49 y.o. MRN: [id number]  Chief Complaint  Patient presents with  . Knot on her neck    Pt stated that she noticed that she had a knot on the Lt side of her neck 03/31/2020. It was tender to the touch and thats how she noticed that it was there. She feels pressure in her jaw when she is chewing/biting.    Subjective:   49 year old lady who has noticed a knot on the left side of her neck for the last few days that persists.  Its not very tender.  She feels a little pressure when she felt a pressure left ear.  Current allergies, medications, problem list, past/family and social histories reviewed.  Objective:  BP 122/76   Pulse 60   Temp 97.6 F (36.4 C) (Temporal)   Ht 5\' 4"  (1.626 m)   Wt 189 lb 3.2 oz (85.8 kg)   LMP 06/08/2017   SpO2 97%   BMI 32.48 kg/m   She has a small lymph node at the right occipital area that she has known she has had for a long time and it is unchanged.  TMs are entirely normal.  Throat clear.  Neck supple.  She has a 1.5 to 2 cm node on the left side of her neck in the anterior triangle just above the mid sternocleidomastoid muscle area.  This is nontender, freely mobile, soft.  No axillary or inguinal nodes.  No hepatosplenomegaly.  Assessment & Plan:   Assessment: 1. Cervical adenopathy   2. Dysfunction of left eustachian tube       Plan: See instructions.  Reassurance.  This should resolve with time.  No orders of the defined types were placed in this encounter.   No orders of the defined types were placed in this encounter.        Patient Instructions    Do nothing about the lymph gland in the left side of your neck at this time.  However if it is not shrinking down over the next 3 or 4 weeks return for it to be rechecked.  If it is getting worse or giving you problems in the meanwhile come in sooner.  Also check your axillary and inguinal regions for lymph glands.  You  probably have mild eustachian tube dysfunction in that left ear.  Use your Flonase spray 2 sprays each nostril twice daily for a couple of days and then reduce to once daily over the next week.  If problems persist get rechecked.  Return if problems  I do encourage you to reconsider and get your COVID-19 vaccine.   If you have lab work done today you will be contacted with your lab results within the next 2 weeks.  If you have not heard from 08/08/2017 then please contact us. The fastest way to get your results is to register for My Chart.   IF you received an x-ray today, you will receive an invoice from [facility]. Please contact [facility] at [phone] with questions or concerns regarding your invoice.   IF you received labwork today, you will receive an invoice from [place]. Please contact [facility] at [phone] with questions or concerns regarding your invoice.   Our billing staff will not be able to assist you with questions regarding bills from these companies.  You will be contacted with the lab results as soon as they are available. The fastest way to get your results  is to activate your My Chart account. Instructions are located on the last page of this paperwork. If you have not heard from [place] regarding the results in 2 weeks, please contact this office.        No follow-ups on file.   Janace Hoard, MD 04/04/2020

## 2020-04-22 ENCOUNTER — Telehealth: Payer: 59 | Admitting: Emergency Medicine

## 2020-04-22 DIAGNOSIS — J069 Acute upper respiratory infection, unspecified: Secondary | ICD-10-CM | POA: Diagnosis not present

## 2020-04-22 MED ORDER — BENZONATATE 100 MG PO CAPS: 100.0000 mg | ORAL_CAPSULE | Freq: Two times a day (BID) | ORAL | 0 refills | Status: DC | PRN

## 2020-04-22 MED ORDER — FLUTICASONE PROPIONATE 50 MCG/ACT NA SUSP: 2.0000 | Freq: Every day | NASAL | 0 refills | Status: DC

## 2020-04-22 NOTE — Progress Notes (Signed)

## 2020-06-02 ENCOUNTER — Telehealth (INDEPENDENT_AMBULATORY_CARE_PROVIDER_SITE_OTHER): Payer: 59 | Admitting: Family Medicine

## 2020-06-02 ENCOUNTER — Other Ambulatory Visit: Payer: Self-pay

## 2020-06-02 ENCOUNTER — Telehealth: Payer: 59 | Admitting: Emergency Medicine

## 2020-06-02 DIAGNOSIS — J069 Acute upper respiratory infection, unspecified: Secondary | ICD-10-CM

## 2020-06-02 NOTE — Progress Notes (Signed)
Virtual Visit Note  I connected with patient on 06/02/20 at 521pm by doximity video and verified that I am speaking with the correct person using two identifiers. Audrey Christian is currently located at home and patient is currently with them during visit. The provider, Myles Lipps, MD is located in their office at time of visit.  I discussed the limitations, risks, security and privacy concerns of performing an evaluation and management service by telephone and the availability of in person appointments. I also discussed with the patient that there may be a patient responsible charge related to this service. The patient expressed understanding and agreed to proceed.   I provided 12 minutes of non-face-to-face time during this encounter.  Chief Complaint  Patient presents with  . Illness    runny nose, facial pressure, fever, and sore throat. Taking zyrtec and sudafed. Thinks she may have been exposed to covid. has covid testing scheduld for tomorrow at [facility]    HPI ? 3 days ago started with mild sore throat, following day started having cough, then last night she started having fevers, body aches, nasal congestion, starting to not smell today Denies any nausea, vomiting, diarrhea Has been taking claritin, flonase, benadryl and sudafed There have been cases of covid + at work, not directly in her work space Has not been vaccinated  Does not smoke and no sinus surgeries Nobody sick at home She is scheduled to be tested for covid at [facility] tomorrow She has tessalon pearles Works at daycare center   Allergies  Allergen Reactions  . Erythromycin     REACTION: Upset Stomach    Prior to Admission medications   Medication Sig Start Date End Date Taking? Authorizing Provider  albuterol (VENTOLIN HFA) 108 (90 Base) MCG/ACT inhaler Inhale 2 puffs into the lungs every 4 (four) hours as needed for wheezing or shortness of breath (cough, shortness of breath or wheezing.). 02/21/20  Yes  Sagardia, Eilleen Kempf, MD  Calcium Carbonate-Vitamin D (CALTRATE 600+D) 600-400 MG-UNIT per tablet Take 2 tablets by mouth daily.    Yes [provider]  Ciclopirox 1 % shampoo APPLICATIONS APPLY ON THE SKIN ALTERNATE WITH OTC TSAL 2 X WEEK 01/30/18  Yes [provider]  clobetasol (TEMOVATE) 0.05 % external solution APPLY TO AFFECTED AREA ON SCALP TWICE A DAY AS NEEDED 04/04/18  Yes [provider]  estradiol (ESTRACE) 0.5 MG tablet Take 0.5 mg by mouth daily.   Yes [provider]  fluticasone (FLONASE) 50 MCG/ACT nasal spray Place 2 sprays into both nostrils daily. 04/22/20  Yes Roxy Horseman, PA-C  levothyroxine (SYNTHROID) 75 MCG tablet TAKE 1 TABLET BY MOUTH EVERY DAY 07/24/19  Yes Romero Belling, MD  medroxyPROGESTERone (PROVERA) 2.5 MG tablet Take 2.5 mg by mouth daily.   Yes [provider]  Multiple Vitamin (MULTIVITAMIN) tablet Take 1 tablet by mouth daily.     Yes [provider]  OVER THE COUNTER MEDICATION    Yes [provider]  polyethylene glycol powder (GLYCOLAX/MIRALAX) powder TAKE 17 GRAMS BY MOUTH DAILY 11/29/16  Yes Porfirio Oar, PA    Past Medical History:  Diagnosis Date  . Allergy   . Alopecia areata 05/2002  . Anemia   . ANXIETY 06/03/2007  . ASTHMA 06/03/2007  . Bradycardia    Asymptomatic  . Chronic kidney disease   . HYPOTHYROIDISM 06/03/2007  . IBS (irritable bowel syndrome)     Past Surgical History:  Procedure Laterality Date  . CHOLECYSTECTOMY  05/2002  .  TUBAL LIGATION      Social History   Tobacco Use  . Smoking status: Never Smoker  . Smokeless tobacco: Never Used  Substance Use Topics  . Alcohol use: No    Family History  Problem Relation Age of Onset  . Hypothyroidism Mother   . Hypertension Mother   . Cancer Father   . Cancer Maternal Grandmother   . Cancer Maternal Grandfather   . Cancer Paternal Grandmother   . Cancer Paternal Grandfather     ROS Per  hpi  Objective  Vitals as reported by the patient:  GEN: AAOx3, NAD HEENT: [place]/AT, pupils are symmetrical, EOMI, non-icteric sclera Resp: breathing comfortably, speaking in full sentences Skin: no rashes noted, no pallor Psych: good eye contact, normal mood and affect   ASSESSMENT and PLAN  1. Upper respiratory tract infection, unspecified type Presumed covid, testing tomorrow, discussed supportive measures and quarantine measures and RTC precautions.   FOLLOW-UP: prn   The above assessment and management plan was discussed with the patient. The patient verbalized understanding of and has agreed to the management plan. Patient is aware to call the clinic if symptoms persist or worsen. Patient is aware when to return to the clinic for a follow-up visit. Patient educated on when it is appropriate to go to the emergency department.     Myles Lipps, MD Primary Care at [facility] [address] [place], [place] [address] Ph.  [phone] Fax [phone]

## 2020-07-07 ENCOUNTER — Other Ambulatory Visit: Payer: Self-pay | Admitting: Endocrinology

## 2020-07-30 ENCOUNTER — Ambulatory Visit: Payer: 59 | Admitting: Endocrinology

## 2020-07-30 ENCOUNTER — Other Ambulatory Visit: Payer: Self-pay

## 2020-07-30 ENCOUNTER — Encounter: Payer: Self-pay | Admitting: Endocrinology

## 2020-07-30 VITALS — BP 120/70 | HR 72 | Ht 64.0 in | Wt 198.8 lb

## 2020-07-30 DIAGNOSIS — E039 Hypothyroidism, unspecified: Secondary | ICD-10-CM

## 2020-07-30 LAB — T4, FREE: Free T4: 0.88 ng/dL (ref 0.60–1.60)

## 2020-07-30 LAB — TSH: TSH: 3.37 u[IU]/mL (ref 0.35–4.50)

## 2020-07-30 NOTE — Patient Instructions (Signed)
Thyroid blood tests are requested for you today.  We'll let you know about the results.   Please come back for a follow-up appointment in 1 year.   

## 2020-07-30 NOTE — Progress Notes (Signed)
Subjective:    Patient ID: Audrey Christian, female    DOB: 06/01/1971, 49 y.o.   MRN: [id number]  HPI Pt returns for chronic primary hyperthyroidism (dx'ed 1999; [place] in 2016 showed normal size gland with tiny nodules--no change).  pt states she feels well in general, except for a recent procedure on her left ankle.  She takes synthroid as rx'ed.  Past Medical History:  Diagnosis Date  . Allergy   . Alopecia areata 05/2002  . Anemia   . ANXIETY 06/03/2007  . ASTHMA 06/03/2007  . Bradycardia    Asymptomatic  . Chronic kidney disease   . HYPOTHYROIDISM 06/03/2007  . IBS (irritable bowel syndrome)     Past Surgical History:  Procedure Laterality Date  . CHOLECYSTECTOMY  05/2002  . TUBAL LIGATION      Social History   Socioeconomic History  . Marital status: Married    Spouse name: Les Pou  . Number of children: 1  . Years of education: Not on file  . 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 Smoking status: Never Smoker  . Smokeless tobacco: Never Used  Vaping Use  . Vaping Use: Never used  Substance and Sexual Activity  . Alcohol use: No  . Drug use: No  . Sexual activity: Yes    Birth control/protection: None  Other Topics Concern  . Not on file  Social History Narrative  . Not on file   Social Determinants of Health   Financial Resource Strain:   . Difficulty of Paying Living Expenses: Not on file  Food Insecurity:   . Worried About Programme researcher, broadcasting/film/video in the Last Year: Not on file  . Ran Out of Food in the Last Year: Not on file  Transportation Needs:   . Lack of Transportation (Medical): Not on file  . Lack of Transportation (Non-Medical): Not on file  Physical Activity:   . Days of Exercise per Week: Not on file  . Minutes of Exercise per Session: Not on file  Stress:   . Feeling of Stress : Not on file  Social Connections:   . Frequency of Communication  with Friends and Family: Not on file  . Frequency of Social Gatherings with Friends and Family: Not on file  . Attends Religious Services: Not on file  . Active Member of Clubs or Organizations: Not on file  . Attends Banker Meetings: Not on file  . Marital Status: Not on file  Intimate Partner Violence:   . Fear of Current or Ex-Partner: Not on file  . Emotionally Abused: Not on file  . Physically Abused: Not on file  . Sexually Abused: Not on file    Current Outpatient Medications on File Prior to Visit  Medication Sig Dispense Refill  . albuterol (VENTOLIN HFA) 108 (90 Base) MCG/ACT inhaler Inhale 2 puffs into the lungs every 4 (four) hours as needed for wheezing or shortness of breath (cough, shortness of breath or wheezing.). 18 g 5  . Calcium Carbonate-Vitamin D (CALTRATE 600+D) 600-400 MG-UNIT per tablet Take 2 tablets by mouth daily.     . Ciclopirox 1 % shampoo APPLICATIONS APPLY ON THE SKIN ALTERNATE WITH OTC TSAL 2 X WEEK  2  . clobetasol (TEMOVATE) 0.05 % external solution APPLY TO AFFECTED AREA ON SCALP TWICE A DAY AS NEEDED  2  . estradiol (ESTRACE) 0.5 MG  tablet Take 0.5 mg by mouth daily.    . fluticasone (FLONASE) 50 MCG/ACT nasal spray Place 2 sprays into both nostrils daily. 9.9 mL 0  . medroxyPROGESTERone (PROVERA) 2.5 MG tablet Take 2.5 mg by mouth daily.    . Multiple Vitamin (MULTIVITAMIN) tablet Take 1 tablet by mouth daily.      Marland Kitchen OVER THE COUNTER MEDICATION     . polyethylene glycol powder (GLYCOLAX/MIRALAX) powder TAKE 17 GRAMS BY MOUTH DAILY 527 g 0  . TRAZODONE HCL PO Take 50 mg by mouth as needed.     No current facility-administered medications on file prior to visit.    Allergies  Allergen Reactions  . Erythromycin     REACTION: Upset Stomach    Family History  Problem Relation Age of Onset  . Hypothyroidism Mother   . Hypertension Mother   . Cancer Father   . Cancer Maternal Grandmother   . Cancer Maternal Grandfather   .  Cancer Paternal Grandmother   . Cancer Paternal Grandfather     BP 120/70   Pulse 72   Ht 5\' 4"  (1.626 m)   Wt 198 lb 12.8 oz (90.2 kg)   LMP 06/08/2017   SpO2 99%   BMI 34.12 kg/m    Review of Systems     Objective:   Physical Exam VITAL SIGNS:  See vs page GENERAL: no distress NECK: There is no palpable thyroid enlargement.  No thyroid nodule is palpable.  No palpable lymphadenopathy at the anterior neck.     Lab Results  Component Value Date   TSH 3.37 07/30/2020       Assessment & Plan:  Hypothyroidism, well-replaced.  Please continue the same medication

## 2020-07-31 ENCOUNTER — Other Ambulatory Visit: Payer: Self-pay | Admitting: Endocrinology

## 2020-08-02 ENCOUNTER — Other Ambulatory Visit: Payer: Self-pay | Admitting: Endocrinology

## 2020-09-03 ENCOUNTER — Telehealth (INDEPENDENT_AMBULATORY_CARE_PROVIDER_SITE_OTHER): Payer: 59 | Admitting: Emergency Medicine

## 2020-09-03 ENCOUNTER — Encounter: Payer: Self-pay | Admitting: Emergency Medicine

## 2020-09-03 ENCOUNTER — Other Ambulatory Visit: Payer: Self-pay

## 2020-09-03 VITALS — Ht 64.0 in | Wt 180.0 lb

## 2020-09-03 DIAGNOSIS — J0100 Acute maxillary sinusitis, unspecified: Secondary | ICD-10-CM | POA: Diagnosis not present

## 2020-09-03 DIAGNOSIS — J3489 Other specified disorders of nose and nasal sinuses: Secondary | ICD-10-CM

## 2020-09-03 DIAGNOSIS — R6889 Other general symptoms and signs: Secondary | ICD-10-CM

## 2020-09-03 DIAGNOSIS — R059 Cough, unspecified: Secondary | ICD-10-CM

## 2020-09-03 DIAGNOSIS — U071 COVID-19: Secondary | ICD-10-CM

## 2020-09-03 DIAGNOSIS — R062 Wheezing: Secondary | ICD-10-CM

## 2020-09-03 HISTORY — DX: COVID-19: U07.1

## 2020-09-03 MED ORDER — AMOXICILLIN-POT CLAVULANATE 875-125 MG PO TABS: 1.0000 | ORAL_TABLET | Freq: Two times a day (BID) | ORAL | 0 refills | Status: AC

## 2020-09-03 MED ORDER — PREDNISONE 20 MG PO TABS: 40.0000 mg | ORAL_TABLET | Freq: Every day | ORAL | 0 refills | Status: AC

## 2020-09-03 NOTE — Patient Instructions (Signed)
° ° ° °  If you have lab work done today you will be contacted with your lab results within the next 2 weeks.  If you have not heard from us then please contact us. The fastest way to get your results is to register for My Chart. ° ° °IF you received an x-ray today, you will receive an invoice from [facility]. Please contact [facility] at [phone] with questions or concerns regarding your invoice.  ° °IF you received labwork today, you will receive an invoice from [facility]. Please contact [facility] at [phone] with questions or concerns regarding your invoice.  ° °Our billing staff will not be able to assist you with questions regarding bills from these companies. ° °You will be contacted with the lab results as soon as they are available. The fastest way to get your results is to activate your My Chart account. Instructions are located on the last page of this paperwork. If you have not heard from us regarding the results in 2 weeks, please contact this office. °  ° ° ° °

## 2020-09-03 NOTE — Progress Notes (Signed)
Telemedicine Encounter- SOAP NOTE Established Patient Patient: Home  Provider: Office     This telephone encounter was conducted with the patient's (or proxy's) verbal consent via audio telecommunications: yes/no: Yes Patient was instructed to have this encounter in a suitably private space; and to only have persons present to whom they give permission to participate. In addition, patient identity was confirmed by use of name plus two identifiers (DOB and address).  I discussed the limitations, risks, security and privacy concerns of performing an evaluation and management service by telephone and the availability of in person appointments. I also discussed with the patient that there may be a patient responsible charge related to this service. The patient expressed understanding and agreed to proceed.  I spent a total of TIME; 0 MIN TO 60 MIN: 20 minutes talking with the patient or their proxy.  Chief Complaint  Patient presents with  . Nasal Congestion    all sx going on since a cuple of days before thanksgiving.  . Cough  . Wheezing  . facial pressure.    Subjective   PROMISS LABARBERA is a 49 y.o. female established patient. Telephone visit today complaining of sinus pressure of flulike symptoms that started 6 days ago.  Got better with Sudafed but symptoms took a turn for the worse 2 days ago now with productive cough, wheezing, and runny nose.  Denies fever, chills, difficulty breathing, chest pain, nausea or vomiting, diarrhea, or any other associated significant symptoms.  HPI   Patient Active Problem List   Diagnosis Date Noted  . Polyp, corpus uteri 06/12/2015  . Multinodular goiter 04/01/2015  . Iron deficiency anemia 12/13/2012  . Hypothyroidism 06/03/2007  . ASTHMA 06/03/2007    Past Medical History:  Diagnosis Date  . Allergy   . Alopecia areata 05/2002  . Anemia   . ANXIETY 06/03/2007  . ASTHMA 06/03/2007  . Bradycardia    Asymptomatic  . Chronic kidney  disease   . HYPOTHYROIDISM 06/03/2007  . IBS (irritable bowel syndrome)     Current Outpatient Medications  Medication Sig Dispense Refill  . albuterol (VENTOLIN HFA) 108 (90 Base) MCG/ACT inhaler Inhale 2 puffs into the lungs every 4 (four) hours as needed for wheezing or shortness of breath (cough, shortness of breath or wheezing.). 18 g 5  . Calcipotriene-Betameth Diprop (WYNZORA) 0.005-0.064 % CREA Apply topically.    . Calcium Carbonate-Vitamin D (CALTRATE 600+D) 600-400 MG-UNIT per tablet Take 2 tablets by mouth daily.     . Ciclopirox 1 % shampoo APPLICATIONS APPLY ON THE SKIN ALTERNATE WITH OTC TSAL 2 X WEEK  2  . clobetasol (TEMOVATE) 0.05 % external solution APPLY TO AFFECTED AREA ON SCALP TWICE A DAY AS NEEDED  2  . Dextromethorphan-guaiFENesin (MUCINEX DM PO) Take by mouth.    . estradiol (ESTRACE) 0.5 MG tablet Take 0.5 mg by mouth daily.    Marland Kitchen levothyroxine (SYNTHROID) 75 MCG tablet TAKE 1 TABLET BY MOUTH EVERY DAY 90 tablet 1  . loratadine (CLARITIN) 10 MG tablet Take 10 mg by mouth daily.    . medroxyPROGESTERone (PROVERA) 2.5 MG tablet Take 2.5 mg by mouth daily.    . meloxicam (MOBIC) 15 MG tablet Take 15 mg by mouth daily.    . Multiple Vitamin (MULTIVITAMIN) tablet Take 1 tablet by mouth daily.      Marland Kitchen OVER THE COUNTER MEDICATION     . polyethylene glycol powder (GLYCOLAX/MIRALAX) powder TAKE 17 GRAMS BY MOUTH DAILY 527 g 0  .  fluticasone (FLONASE) 50 MCG/ACT nasal spray Place 2 sprays into both nostrils daily. (Patient not taking: Reported on 09/03/2020) 9.9 mL 0  . TRAZODONE HCL PO Take 50 mg by mouth as needed. (Patient not taking: Reported on 09/03/2020)     No current facility-administered medications for this visit.    Allergies  Allergen Reactions  . Erythromycin     REACTION: Upset Stomach    Social History   Socioeconomic History  . Marital status: Married    Spouse name: Les Pou  . Number of children: 1  . Years of education: Not on file  . 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 Smoking status: Never Smoker  . Smokeless tobacco: Never Used  Vaping Use  . Vaping Use: Never used  Substance and Sexual Activity  . Alcohol use: No  . Drug use: No  . Sexual activity: Yes    Birth control/protection: None  Other Topics Concern  . Not on file  Social History Narrative  . Not on file   Social Determinants of Health   Financial Resource Strain:   . Difficulty of Paying Living Expenses: Not on file  Food Insecurity:   . Worried About Programme researcher, broadcasting/film/video in the Last Year: Not on file  . Ran Out of Food in the Last Year: Not on file  Transportation Needs:   . Lack of Transportation (Medical): Not on file  . Lack of Transportation (Non-Medical): Not on file  Physical Activity:   . Days of Exercise per Week: Not on file  . Minutes of Exercise per Session: Not on file  Stress:   . Feeling of Stress : Not on file  Social Connections:   . Frequency of Communication with Friends and Family: Not on file  . Frequency of Social Gatherings with Friends and Family: Not on file  . Attends Religious Services: Not on file  . Active Member of Clubs or Organizations: Not on file  . Attends Banker Meetings: Not on file  . Marital Status: Not on file  Intimate Partner Violence:   . Fear of Current or Ex-Partner: Not on file  . Emotionally Abused: Not on file  . Physically Abused: Not on file  . Sexually Abused: Not on file    Review of Systems  Constitutional: Negative.  Negative for chills and fever.  HENT: Positive for congestion and sinus pain. Negative for sore throat.   Respiratory: Positive for cough, sputum production and wheezing. Negative for hemoptysis and shortness of breath.   Cardiovascular: Negative for chest pain and palpitations.  Gastrointestinal: Negative.  Negative for abdominal pain, diarrhea, nausea and  vomiting.  Genitourinary: Negative.  Negative for dysuria and hematuria.  Musculoskeletal: Negative.  Negative for myalgias.  Skin: Negative.  Negative for rash.  Neurological: Negative.  Negative for dizziness and headaches.  All other systems reviewed and are negative.   Objective  Alert and oriented x3 in no apparent respiratory distress Vitals as reported by the patient: Today's Vitals   09/03/20 1353  Weight: 180 lb (81.6 kg)  Height: 5\' 4"  (1.626 m)    There are no diagnoses linked to this encounter. Stephanee was seen today for nasal congestion, cough, wheezing and facial pressure..  Diagnoses and all orders for this visit:  Acute non-recurrent maxillary sinusitis -     amoxicillin-clavulanate (AUGMENTIN) 875-125 MG tablet; Take 1  tablet by mouth 2 (two) times daily for 7 days.  Cough  Sinus pressure  Flu-like symptoms  Wheezing -     predniSONE (DELTASONE) 20 MG tablet; Take 2 tablets (40 mg total) by mouth daily with breakfast for 5 days.     I discussed the assessment and treatment plan with the patient. The patient was provided an opportunity to ask questions and all were answered. The patient agreed with the plan and demonstrated an understanding of the instructions.   The patient was advised to call back or seek an in-person evaluation if the symptoms worsen or if the condition fails to improve as anticipated.  I provided 20 minutes of non-face-to-face time during this encounter.  Georgina Quint, MD  Primary Care at [facility]

## 2020-09-07 ENCOUNTER — Ambulatory Visit (HOSPITAL_COMMUNITY)
Admission: RE | Admit: 2020-09-07 | Discharge: 2020-09-07 | Disposition: A | Discharge status: Home / Self Care | Payer: 59 | Source: Ambulatory Visit | Attending: Family Medicine | Admitting: Family Medicine

## 2020-09-07 ENCOUNTER — Other Ambulatory Visit: Payer: Self-pay

## 2020-09-07 ENCOUNTER — Encounter (HOSPITAL_COMMUNITY): Payer: Self-pay

## 2020-09-07 VITALS — BP 157/98 | HR 65 | Temp 98.5°F | Resp 18

## 2020-09-07 DIAGNOSIS — J069 Acute upper respiratory infection, unspecified: Secondary | ICD-10-CM

## 2020-09-07 MED ORDER — PROMETHAZINE-DM 6.25-15 MG/5ML PO SYRP: 5.0000 mL | ORAL_SOLUTION | Freq: Four times a day (QID) | ORAL | 0 refills | Status: DC | PRN

## 2020-09-07 NOTE — ED Provider Notes (Signed)
[facility]    CSN: [id number] Arrival date & time: 09/07/20  1041      History   Chief Complaint Chief Complaint  Patient presents with  . Cough    HPI Audrey Christian is a 49 y.o. female.   Presenting today following up on 12 day hx of sinus pressure and pain, congestion, cough, wheezing (resolved now), fatigue, ear pressure. Saw PCP 4 days ago and was given augmentin and prednisone which have been helping. Has not been tested for COVID thus far. Denies fever, chills, CP, SOB, N/V/D. Hx of allergic rhinitis on claritin daily, asthma not using her albuterol inhaler. Also taking mucinex DM twice daily.      Past Medical History:  Diagnosis Date  . Allergy   . Alopecia areata 05/2002  . Anemia   . ANXIETY 06/03/2007  . ASTHMA 06/03/2007  . Bradycardia    Asymptomatic  . Chronic kidney disease   . HYPOTHYROIDISM 06/03/2007  . IBS (irritable bowel syndrome)     Patient Active Problem List   Diagnosis Date Noted  . Polyp, corpus uteri 06/12/2015  . Multinodular goiter 04/01/2015  . Iron deficiency anemia 12/13/2012  . Hypothyroidism 06/03/2007  . ASTHMA 06/03/2007    Past Surgical History:  Procedure Laterality Date  . CHOLECYSTECTOMY  05/2002  . TUBAL LIGATION      OB History   No obstetric history on file.      Home Medications    Prior to Admission medications   Medication Sig Start Date End Date Taking? Authorizing Provider  amoxicillin-clavulanate (AUGMENTIN) 875-125 MG tablet Take 1 tablet by mouth 2 (two) times daily for 7 days. 09/03/20 09/10/20 Yes Sagardia, Eilleen Kempf, MD  Calcium Carbonate-Vitamin D (CALTRATE 600+D) 600-400 MG-UNIT per tablet Take 2 tablets by mouth daily.    Yes [provider]  Dextromethorphan-guaiFENesin [facility] DM PO) Take by mouth.   Yes [provider]  estradiol (ESTRACE) 0.5 MG tablet Take 0.5 mg by mouth daily.   Yes [provider]  levothyroxine (SYNTHROID) 75 MCG tablet TAKE 1  TABLET BY MOUTH EVERY DAY 08/02/20  Yes Romero Belling, MD  medroxyPROGESTERone (PROVERA) 2.5 MG tablet Take 2.5 mg by mouth daily.   Yes [provider]  meloxicam (MOBIC) 15 MG tablet Take 15 mg by mouth daily. 08/12/20  Yes [provider]  predniSONE (DELTASONE) 20 MG tablet Take 2 tablets (40 mg total) by mouth daily with breakfast for 5 days. 09/03/20 09/08/20 Yes Sagardia, Eilleen Kempf, MD  albuterol (VENTOLIN HFA) 108 (90 Base) MCG/ACT inhaler Inhale 2 puffs into the lungs every 4 (four) hours as needed for wheezing or shortness of breath (cough, shortness of breath or wheezing.). 02/21/20   Georgina Quint, MD  Calcipotriene-Betameth Diprop [facility]) 0.005-0.064 % CREA Apply topically.    [provider]  Ciclopirox 1 % shampoo APPLICATIONS APPLY ON THE SKIN ALTERNATE WITH OTC TSAL 2 X WEEK 01/30/18   [provider]  clobetasol (TEMOVATE) 0.05 % external solution APPLY TO AFFECTED AREA ON SCALP TWICE A DAY AS NEEDED 04/04/18   [provider]  fluticasone (FLONASE) 50 MCG/ACT nasal spray Place 2 sprays into both nostrils daily. Patient not taking: Reported on 09/03/2020 04/22/20   Roxy Horseman, PA-C  loratadine (CLARITIN) 10 MG tablet Take 10 mg by mouth daily.    [provider]  Multiple Vitamin (MULTIVITAMIN) tablet Take 1 tablet by mouth daily.      [provider]  OVER THE COUNTER  MEDICATION     [provider]  polyethylene glycol powder (GLYCOLAX/MIRALAX) powder TAKE 17 GRAMS BY MOUTH DAILY 11/29/16   Porfirio Oar, PA  promethazine-dextromethorphan (PROMETHAZINE-DM) 6.25-15 MG/5ML syrup Take 5 mLs by mouth 4 (four) times daily as needed for cough. 09/07/20   Particia Nearing, PA-C  TRAZODONE HCL PO Take 50 mg by mouth as needed. Patient not taking: Reported on 09/03/2020    [provider]    Family History Family History  Problem Relation Age of Onset  . Hypothyroidism Mother   .  Hypertension Mother   . Cancer Father   . Cancer Maternal Grandmother   . Cancer Maternal Grandfather   . Cancer Paternal Grandmother   . Cancer Paternal Grandfather     Social History Social History   Tobacco Use  . Smoking status: Never Smoker  . Smokeless tobacco: Never Used  Vaping Use  . Vaping Use: Never used  Substance Use Topics  . Alcohol use: No  . Drug use: No     Allergies   Erythromycin and Latex   Review of Systems Review of Systems PER HPI    Physical Exam Triage Vital Signs ED Triage Vitals  Enc Vitals Group     BP 09/07/20 1116 (!) 157/98     Pulse Rate 09/07/20 1116 65     Resp 09/07/20 1116 18     Temp 09/07/20 1116 98.5 F (36.9 C)     Temp Source 09/07/20 1116 Oral     SpO2 09/07/20 1116 97 %     Weight --      Height --      Head Circumference --      Peak Flow --      Pain Score 09/07/20 1111 4     Pain Loc --      Pain Edu? --      Excl. in GC? --    No data found.  Updated Vital Signs BP (!) 157/98 (BP Location: Left Arm)   Pulse 65   Temp 98.5 F (36.9 C) (Oral)   Resp 18   LMP 06/08/2017   SpO2 97%   Visual Acuity Right Eye Distance:   Left Eye Distance:   Bilateral Distance:    Right Eye Near:   Left Eye Near:    Bilateral Near:     Physical Exam Vitals and nursing note reviewed.  Constitutional:      Appearance: Normal appearance. She is not ill-appearing.  HENT:     Head: Atraumatic.     Right Ear: Tympanic membrane normal.     Left Ear: Tympanic membrane normal.     Nose: Congestion present.     Mouth/Throat:     Mouth: Mucous membranes are moist.     Pharynx: Posterior oropharyngeal erythema present.  Eyes:     Extraocular Movements: Extraocular movements intact.     Conjunctiva/sclera: Conjunctivae normal.  Cardiovascular:     Rate and Rhythm: Normal rate and regular rhythm.     Heart sounds: Normal heart sounds.  Pulmonary:     Effort: Pulmonary effort is normal.     Breath sounds: Normal  breath sounds. No wheezing or rales.  Abdominal:     General: Bowel sounds are normal. There is no distension.     Palpations: Abdomen is soft.     Tenderness: There is no abdominal tenderness. There is no right CVA tenderness, left CVA tenderness or guarding.  Musculoskeletal:  General: Normal range of motion.     Cervical back: Normal range of motion and neck supple.  Skin:    General: Skin is warm and dry.     Findings: No rash.  Neurological:     Mental Status: She is alert and oriented to person, place, and time.  Psychiatric:        Mood and Affect: Mood normal.        Thought Content: Thought content normal.        Judgment: Judgment normal.       UC Treatments / Results  Labs (all labs ordered are listed, but only abnormal results are displayed) Labs Reviewed  SARS CORONAVIRUS 2 (TAT 6-24 HRS)    EKG   Radiology No results found.  Procedures Procedures (including critical care time)  Medications Ordered in UC Medications - No data to display  Initial Impression / Assessment and Plan / UC Course  I have reviewed the triage vital signs and the nursing notes.  Pertinent labs & imaging results that were available during my care of the patient were reviewed by me and considered in my medical decision making (see chart for details).     Patient with improvement in sxs on current regimen and no evidence of worsening on exam. Complete current medications from PCP, add phenergan DM, increase flonase to twice daily. Work note given. Return if worsening  Final Clinical Impressions(s) / UC Diagnoses   Final diagnoses:  Acute upper respiratory infection   Discharge Instructions   None    ED Prescriptions    Medication Sig Dispense Auth. Provider   promethazine-dextromethorphan (PROMETHAZINE-DM) 6.25-15 MG/5ML syrup Take 5 mLs by mouth 4 (four) times daily as needed for cough. 100 mL Particia Nearing, [place]     PDMP not reviewed this encounter.    Particia Nearing, [place] 09/07/20 1416

## 2020-09-07 NOTE — ED Triage Notes (Signed)
Pt c/o sinus congestion, runny nose, productive cough with white sputum for approx 12 days and had virtual visit with PCP on Wednesday and was Rx prednisone, amoxicillin mucinex, claritin, nyquil, with some improvement to "chest congestion".  Also reports pain, congestion left ear, decreased energy.   Denies h/o COVID, no COVID vaccines taken. Denies SOB, body aches, fever.

## 2020-09-22 ENCOUNTER — Encounter (HOSPITAL_COMMUNITY): Payer: Self-pay | Admitting: *Deleted

## 2020-09-22 ENCOUNTER — Other Ambulatory Visit: Payer: Self-pay

## 2020-09-22 ENCOUNTER — Ambulatory Visit (HOSPITAL_COMMUNITY)
Admission: EM | Admit: 2020-09-22 | Discharge: 2020-09-22 | Disposition: A | Discharge status: Home / Self Care | Payer: 59 | Attending: Internal Medicine | Admitting: Internal Medicine

## 2020-09-22 DIAGNOSIS — Z20822 Contact with and (suspected) exposure to covid-19: Secondary | ICD-10-CM

## 2020-09-22 DIAGNOSIS — R6889 Other general symptoms and signs: Secondary | ICD-10-CM | POA: Diagnosis not present

## 2020-09-22 DIAGNOSIS — U071 COVID-19: Secondary | ICD-10-CM | POA: Insufficient documentation

## 2020-09-22 MED ORDER — IBUPROFEN 600 MG PO TABS: 600.0000 mg | ORAL_TABLET | Freq: Four times a day (QID) | ORAL | 0 refills | Status: DC | PRN

## 2020-09-22 MED ORDER — BENZONATATE 100 MG PO CAPS: 100.0000 mg | ORAL_CAPSULE | Freq: Three times a day (TID) | ORAL | 0 refills | Status: DC

## 2020-09-22 NOTE — ED Provider Notes (Signed)
[facility]    CSN: [id number] Arrival date & time: 09/22/20  1422      History   Chief Complaint Chief Complaint  Patient presents with   Cough   Headache   Chills    HPI Audrey Christian is a 50 y.o. female comes to urgent care with 3-day history of throbbing headaches, fever, chills and congestion.  Patient's husband was diagnosed with COVID-19 infection couple of days ago.  She denies any nausea, vomiting or diarrhea.  She has a cough which is nonproductive.  No dizziness, near syncope or syncopal episodes.Marland Kitchen   HPI  Past Medical History:  Diagnosis Date   Allergy    Alopecia areata 05/2002   Anemia    ANXIETY 06/03/2007   ASTHMA 06/03/2007   Bradycardia    Asymptomatic   Chronic kidney disease    HYPOTHYROIDISM 06/03/2007   IBS (irritable bowel syndrome)     Patient Active Problem List   Diagnosis Date Noted   Polyp, corpus uteri 06/12/2015   Multinodular goiter 04/01/2015   Iron deficiency anemia 12/13/2012   Hypothyroidism 06/03/2007   ASTHMA 06/03/2007    Past Surgical History:  Procedure Laterality Date   CHOLECYSTECTOMY  05/2002   TUBAL LIGATION      OB History   No obstetric history on file.      Home Medications    Prior to Admission medications   Medication Sig Start Date End Date Taking? Authorizing Provider  albuterol (VENTOLIN HFA) 108 (90 Base) MCG/ACT inhaler Inhale 2 puffs into the lungs every 4 (four) hours as needed for wheezing or shortness of breath (cough, shortness of breath or wheezing.). 02/21/20  Yes Sagardia, Eilleen Kempf, MD  Calcium Carbonate-Vitamin D 600-400 MG-UNIT tablet Take 2 tablets by mouth daily.   Yes [provider]  clobetasol (TEMOVATE) 0.05 % external solution APPLY TO AFFECTED AREA ON SCALP TWICE A DAY AS NEEDED 04/04/18  Yes [provider]  Dextromethorphan-guaiFENesin (MUCINEX DM PO) Take by mouth.   Yes [provider]  estradiol (ESTRACE) 0.5 MG tablet  Take 0.5 mg by mouth daily.   Yes [provider]  levothyroxine (SYNTHROID) 75 MCG tablet TAKE 1 TABLET BY MOUTH EVERY DAY 08/02/20  Yes Romero Belling, MD  loratadine (CLARITIN) 10 MG tablet Take 10 mg by mouth daily.   Yes [provider]  medroxyPROGESTERone (PROVERA) 2.5 MG tablet Take 2.5 mg by mouth daily.   Yes [provider]  meloxicam (MOBIC) 15 MG tablet Take 15 mg by mouth daily. 08/12/20  Yes [provider]  Multiple Vitamin (MULTIVITAMIN) tablet Take 1 tablet by mouth daily.   Yes [provider]  polyethylene glycol powder (GLYCOLAX/MIRALAX) powder TAKE 17 GRAMS BY MOUTH DAILY 11/29/16  Yes Jeffery, Chelle, PA  promethazine-dextromethorphan (PROMETHAZINE-DM) 6.25-15 MG/5ML syrup Take 5 mLs by mouth 4 (four) times daily as needed for cough. 09/07/20  Yes Particia Nearing, PA-C  benzonatate (TESSALON) 100 MG capsule Take 1 capsule (100 mg total) by mouth every 8 (eight) hours. 09/22/20   Merrilee Jansky, MD  Calcipotriene-Betameth Diprop [facility]) 0.005-0.064 % CREA Apply topically.    [provider]  Ciclopirox 1 % shampoo APPLICATIONS APPLY ON THE SKIN ALTERNATE WITH OTC TSAL 2 X WEEK 01/30/18   [provider]  ibuprofen (ADVIL) 600 MG tablet Take 1 tablet (600 mg total) by mouth every 6 (six) hours as needed. 09/22/20   Carlethia Mesquita, Britta Mccreedy, MD  OVER THE COUNTER MEDICATION  [provider]  fluticasone (FLONASE) 50 MCG/ACT nasal spray Place 2 sprays into both nostrils daily. Patient not taking: Reported on 09/03/2020 04/22/20 09/22/20  Roxy Horseman, PA-C    Family History Family History  Problem Relation Age of Onset   Hypothyroidism Mother    Hypertension Mother    Cancer Father    Cancer Maternal Grandmother    Cancer Maternal Grandfather    Cancer Paternal Grandmother    Cancer Paternal Grandfather     Social History Social History   Tobacco Use   Smoking status: Never Smoker    Smokeless tobacco: Never Used  Building services engineer Use: Never used  Substance Use Topics   Alcohol use: No   Drug use: No     Allergies   Erythromycin and Latex   Review of Systems Review of Systems  Constitutional: Positive for fatigue and fever. Negative for appetite change and diaphoresis.  HENT: Positive for congestion. Negative for sore throat.   Respiratory: Negative.   Gastrointestinal: Negative for diarrhea, nausea and vomiting.  Genitourinary: Negative.   Musculoskeletal: Positive for myalgias.  Neurological: Positive for headaches. Negative for light-headedness.     Physical Exam Triage Vital Signs ED Triage Vitals  Enc Vitals Group     BP 09/22/20 1621 (!) 142/91     Pulse Rate 09/22/20 1621 (!) 113     Resp 09/22/20 1621 16     Temp 09/22/20 1621 100.2 F (37.9 C)     Temp Source 09/22/20 1621 Oral     SpO2 09/22/20 1621 99 %     Weight --      Height --      Head Circumference --      Peak Flow --      Pain Score 09/22/20 1625 6     Pain Loc --      Pain Edu? --      Excl. in GC? --    No data found.  Updated Vital Signs BP (!) 142/91 (BP Location: Left Arm)    Pulse (!) 113    Temp 100.2 F (37.9 C) (Oral)    Resp 16    LMP 06/08/2017    SpO2 99%   Visual Acuity Right Eye Distance:   Left Eye Distance:   Bilateral Distance:    Right Eye Near:   Left Eye Near:    Bilateral Near:     Physical Exam Vitals and nursing note reviewed.  Constitutional:      General: She is not in acute distress.    Appearance: She is not ill-appearing.  Cardiovascular:     Rate and Rhythm: Normal rate and regular rhythm.     Heart sounds: Normal heart sounds.  Pulmonary:     Effort: Pulmonary effort is normal.     Breath sounds: Normal breath sounds.  Neurological:     Mental Status: She is alert.     GCS: GCS eye subscore is 4. GCS verbal subscore is 5. GCS motor subscore is 6.      UC Treatments / Results  Labs (all labs ordered are  listed, but only abnormal results are displayed) Labs Reviewed  SARS CORONAVIRUS 2 (TAT 6-24 HRS)    EKG   Radiology No results found.  Procedures Procedures (including critical care time)  Medications Ordered in UC Medications - No data to display  Initial Impression / Assessment and Plan / UC Course  I have reviewed the triage vital signs and the nursing  notes.  Pertinent labs & imaging results that were available during my care of the patient were reviewed by me and considered in my medical decision making (see chart for details).     1.  Flulike symptoms in the setting of exposure to COVID-19 virus: Patient most likely has COVID-19 infection: Ibuprofen 600 mg every 6 hours as needed for body aches and/or fever Tessalon Perles as needed for cough Patient is advised to increase oral fluid intake She is a candidate for monoclonal infusion if Covid test is positive Patient agrees to receiving monoclonal infusion if test is positive. Return precautions given Final Clinical Impressions(s) / UC Diagnoses   Final diagnoses:  Flu-like symptoms  Exposure to COVID-19 virus     Discharge Instructions     Increase oral fluid intake Take medications as directed It's very likely that you have covid-19 infection Please return to urgent care if symptoms are worsen.   ED Prescriptions    Medication Sig Dispense Auth. Provider   ibuprofen (ADVIL) 600 MG tablet Take 1 tablet (600 mg total) by mouth every 6 (six) hours as needed. 30 tablet Keiland Pickering, Britta Mccreedy, MD   benzonatate (TESSALON) 100 MG capsule Take 1 capsule (100 mg total) by mouth every 8 (eight) hours. 21 capsule Lorrane Mccay, Britta Mccreedy, MD     PDMP not reviewed this encounter.   Merrilee Jansky, MD 09/22/20 1723

## 2020-09-22 NOTE — ED Triage Notes (Signed)
Pt reports HA,Fever,Chills and congestion. Pt reports her husband tested positive for COVID on SAT.

## 2020-09-22 NOTE — Discharge Instructions (Signed)
Increase oral fluid intake Take medications as directed It's very likely that you have covid-19 infection Please return to urgent care if symptoms are worsen.

## 2020-09-23 LAB — SARS CORONAVIRUS 2 (TAT 6-24 HRS): SARS Coronavirus 2: POSITIVE — AB

## 2021-01-04 ENCOUNTER — Other Ambulatory Visit: Payer: Self-pay | Admitting: Endocrinology

## 2021-01-27 ENCOUNTER — Other Ambulatory Visit (HOSPITAL_COMMUNITY): Payer: Self-pay | Admitting: Obstetrics & Gynecology

## 2021-01-27 DIAGNOSIS — N950 Postmenopausal bleeding: Secondary | ICD-10-CM

## 2021-02-02 ENCOUNTER — Ambulatory Visit (HOSPITAL_COMMUNITY)
Admission: RE | Admit: 2021-02-02 | Discharge: 2021-02-02 | Disposition: A | Discharge status: Home / Self Care | Payer: 59 | Source: Ambulatory Visit | Attending: Obstetrics & Gynecology | Admitting: Obstetrics & Gynecology

## 2021-02-02 ENCOUNTER — Other Ambulatory Visit: Payer: Self-pay

## 2021-02-02 DIAGNOSIS — N950 Postmenopausal bleeding: Secondary | ICD-10-CM | POA: Insufficient documentation

## 2021-02-28 ENCOUNTER — Other Ambulatory Visit: Payer: Self-pay

## 2021-02-28 ENCOUNTER — Ambulatory Visit (HOSPITAL_COMMUNITY)
Admission: RE | Admit: 2021-02-28 | Discharge: 2021-02-28 | Disposition: A | Discharge status: Home / Self Care | Payer: 59 | Source: Ambulatory Visit | Attending: Medical Oncology | Admitting: Medical Oncology

## 2021-02-28 ENCOUNTER — Encounter (HOSPITAL_COMMUNITY): Payer: Self-pay

## 2021-02-28 VITALS — BP 134/73 | HR 66 | Temp 98.3°F | Resp 16

## 2021-02-28 DIAGNOSIS — J00 Acute nasopharyngitis [common cold]: Secondary | ICD-10-CM | POA: Insufficient documentation

## 2021-02-28 DIAGNOSIS — J45909 Unspecified asthma, uncomplicated: Secondary | ICD-10-CM | POA: Insufficient documentation

## 2021-02-28 DIAGNOSIS — Z20822 Contact with and (suspected) exposure to covid-19: Secondary | ICD-10-CM | POA: Diagnosis not present

## 2021-02-28 DIAGNOSIS — Z79899 Other long term (current) drug therapy: Secondary | ICD-10-CM | POA: Diagnosis not present

## 2021-02-28 LAB — POCT RAPID STREP A, ED / UC: Streptococcus, Group A Screen (Direct): NEGATIVE

## 2021-02-28 LAB — SARS CORONAVIRUS 2 (TAT 6-24 HRS): SARS Coronavirus 2: NEGATIVE

## 2021-02-28 MED ORDER — BENZONATATE 100 MG PO CAPS: 100.0000 mg | ORAL_CAPSULE | Freq: Three times a day (TID) | ORAL | 0 refills | Status: DC

## 2021-02-28 MED ORDER — FLUTICASONE PROPIONATE 50 MCG/ACT NA SUSP: 2.0000 | Freq: Every day | NASAL | 0 refills | Status: DC

## 2021-02-28 NOTE — ED Provider Notes (Signed)
[facility]    CSN: [id number] Arrival date & time: 02/28/21  1048      History   Chief Complaint Chief Complaint  Patient presents with  . Sore Throat    HPI Audrey Christian is a 49 y.o. female.   HPI  Sore Throat: Patient reports that over the past 3 days she has had a sore throat.  She has had a little bit of a cough but states it mostly is her clearing her throat from what she thinks is some sinus drainage.  She is concerned that she has seen some white pockets in the back of her throat.  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  No known sick contacts that she is aware of at this time.  She denies any significant fevers, chest pain, shortness of breath.  She has tried Tylenol for symptoms with some relief. Past Medical History:  Diagnosis Date  . Allergy   . Alopecia areata 05/2002  . Anemia   . ANXIETY 06/03/2007  . ASTHMA 06/03/2007  . Bradycardia    Asymptomatic  . Chronic kidney disease   . HYPOTHYROIDISM 06/03/2007  . IBS (irritable bowel syndrome)     Patient Active Problem List   Diagnosis Date Noted  . Polyp, corpus uteri 06/12/2015  . Multinodular goiter 04/01/2015  . Iron deficiency anemia 12/13/2012  . Hypothyroidism 06/03/2007  . ASTHMA 06/03/2007    Past Surgical History:  Procedure Laterality Date  . CHOLECYSTECTOMY  05/2002  . TUBAL LIGATION      OB History   No obstetric history on file.      Home Medications    Prior to Admission medications   Medication Sig Start Date End Date Taking? Authorizing Provider  albuterol (VENTOLIN HFA) 108 (90 Base) MCG/ACT inhaler Inhale 2 puffs into the lungs every 4 (four) hours as needed for wheezing or shortness of breath (cough, shortness of breath or wheezing.). 02/21/20   Georgina Quint, MD  benzonatate (TESSALON) 100 MG capsule Take 1 capsule (100 mg total) by mouth every 8 (eight) hours. 09/22/20   Merrilee Jansky, MD   Calcipotriene-Betameth Diprop [facility]) 0.005-0.064 % CREA Apply topically.    [provider]  Calcium Carbonate-Vitamin D 600-400 MG-UNIT tablet Take 2 tablets by mouth daily.    [provider]  Ciclopirox 1 % shampoo APPLICATIONS APPLY ON THE SKIN ALTERNATE WITH OTC TSAL 2 X WEEK 01/30/18   [provider]  clobetasol (TEMOVATE) 0.05 % external solution APPLY TO AFFECTED AREA ON SCALP TWICE A DAY AS NEEDED 04/04/18   [provider]  Dextromethorphan-guaiFENesin (MUCINEX DM PO) Take by mouth.    [provider]  estradiol (ESTRACE) 0.5 MG tablet Take 0.5 mg by mouth daily.    [provider]  ibuprofen (ADVIL) 600 MG tablet Take 1 tablet (600 mg total) by mouth every 6 (six) hours as needed. 09/22/20   Merrilee Jansky, MD  levothyroxine (SYNTHROID) 75 MCG tablet TAKE 1 TABLET BY MOUTH EVERY DAY 01/05/21   Romero Belling, MD  loratadine (CLARITIN) 10 MG tablet Take 10 mg by mouth daily.    [provider]  medroxyPROGESTERone (PROVERA) 2.5 MG tablet Take 2.5 mg by mouth daily.    [provider]  meloxicam (MOBIC) 15 MG tablet Take 15 mg by mouth daily. 08/12/20   [provider]  Multiple Vitamin (MULTIVITAMIN) tablet Take 1 tablet by mouth daily.  [provider]  OVER THE COUNTER MEDICATION     [provider]  polyethylene glycol powder (GLYCOLAX/MIRALAX) powder TAKE 17 GRAMS BY MOUTH DAILY 11/29/16   Porfirio Oar, PA  promethazine-dextromethorphan (PROMETHAZINE-DM) 6.25-15 MG/5ML syrup Take 5 mLs by mouth 4 (four) times daily as needed for cough. 09/07/20   Particia Nearing, PA-C  fluticasone [facility]) 50 MCG/ACT nasal spray Place 2 sprays into both nostrils daily. Patient not taking: Reported on 09/03/2020 04/22/20 09/22/20  Roxy Horseman, PA-C    Family History Family History  Problem Relation Age of Onset  . Hypothyroidism Mother   . Hypertension Mother   . Cancer Father   .  Cancer Maternal Grandmother   . Cancer Maternal Grandfather   . Cancer Paternal Grandmother   . Cancer Paternal Grandfather     Social History Social History   Tobacco Use  . Smoking status: Never Smoker  . Smokeless tobacco: Never Used  Vaping Use  . Vaping Use: Never used  Substance Use Topics  . Alcohol use: No  . Drug use: No     Allergies   Erythromycin and Latex   Review of Systems Review of Systems  As stated above in HPI Physical Exam Triage Vital Signs ED Triage Vitals  Enc Vitals Group     BP 02/28/21 1106 134/73     Pulse Rate 02/28/21 1106 66     Resp 02/28/21 1106 16     Temp 02/28/21 1106 98.3 F (36.8 C)     Temp Source 02/28/21 1106 Oral     SpO2 02/28/21 1106 96 %     Weight --      Height --      Head Circumference --      Peak Flow --      Pain Score 02/28/21 1104 7     Pain Loc --      Pain Edu? --      Excl. in GC? --    No data found.  Updated Vital Signs BP 134/73 (BP Location: Right Arm)   Pulse 66   Temp 98.3 F (36.8 C) (Oral)   Resp 16   LMP 06/08/2017   SpO2 96%   Physical Exam Vitals and nursing note reviewed.  Constitutional:      General: She is not in acute distress.    Appearance: She is well-developed. She is not ill-appearing, toxic-appearing or diaphoretic.  HENT:     Head: Normocephalic and atraumatic.     Right Ear: Tympanic membrane and ear canal normal. No tenderness. No middle ear effusion. Tympanic membrane is not erythematous.     Left Ear: Tympanic membrane and ear canal normal. No tenderness.  No middle ear effusion. Tympanic membrane is not erythematous.     Nose: No congestion or rhinorrhea.     Mouth/Throat:     Mouth: Mucous membranes are moist. No oral lesions.     Pharynx: Uvula midline. Posterior oropharyngeal erythema present. No pharyngeal swelling, oropharyngeal exudate or uvula swelling.     Tonsils: No tonsillar exudate or tonsillar abscesses.  Eyes:     Conjunctiva/sclera: Conjunctivae  normal.     Pupils: Pupils are equal, round, and reactive to light.  Cardiovascular:     Rate and Rhythm: Normal rate and regular rhythm.     Heart sounds: Normal heart sounds.  Pulmonary:     Effort: Pulmonary effort is normal.     Breath sounds: Normal breath sounds.  Musculoskeletal:     Cervical  back: Neck supple.  Lymphadenopathy:     Cervical: No cervical adenopathy.  Neurological:     Mental Status: She is alert and oriented to person, place, and time.      UC Treatments / Results  Labs (all labs ordered are listed, but only abnormal results are displayed) Labs Reviewed - No data to display  EKG   Radiology No results found.  Procedures Procedures (including critical care time)  Medications Ordered in UC Medications - No data to display  Initial Impression / Assessment and Plan / UC Course  I have reviewed the triage vital signs and the nursing notes.  Pertinent labs & imaging results that were available during my care of the patient were reviewed by me and considered in my medical decision making (see chart for details).     New.  Likely viral in nature although we are going to swab for strep given her exposure potential.  We discussed red flag signs and symptoms.  In the meantime I am going to call in [place] and Flonase while we await her other results to help with symptoms. Final Clinical Impressions(s) / UC Diagnoses   Final diagnoses:  None   Discharge Instructions   None    ED Prescriptions    None     PDMP not reviewed this encounter.   Rushie Chestnut, [place] 02/28/21 1151

## 2021-02-28 NOTE — ED Triage Notes (Signed)
Pt present sore throat with cough and  White pus pockets  in the back of her throat. Pt states symptoms started a three days ago.

## 2021-03-03 LAB — CULTURE, GROUP A STREP (THRC)

## 2021-03-23 ENCOUNTER — Other Ambulatory Visit: Payer: Self-pay

## 2021-03-23 ENCOUNTER — Encounter (HOSPITAL_BASED_OUTPATIENT_CLINIC_OR_DEPARTMENT_OTHER): Payer: Self-pay | Admitting: Obstetrics

## 2021-03-23 DIAGNOSIS — N950 Postmenopausal bleeding: Secondary | ICD-10-CM

## 2021-03-23 DIAGNOSIS — E041 Nontoxic single thyroid nodule: Secondary | ICD-10-CM

## 2021-03-23 HISTORY — DX: Postmenopausal bleeding: N95.0

## 2021-03-23 HISTORY — DX: Nontoxic single thyroid nodule: E04.1

## 2021-03-23 NOTE — Progress Notes (Signed)
Spoke w/ via phone for pre-op interview---pt Lab needs dos----      none         Lab results------see below COVID test -----patient states asymptomatic no test needed Arrive at -------930 am 03-26-2021 NPO after MN NO Solid Food.  Clear liquids from MN until---830 am then npo Med rec completed Medications to take morning of surgery -----estradiol, albuterol inhaler prn/bring inhaler, synthroid, allegra, medroxyprogesterone,  Diabetic medication ----- Patient instructed no nail polish to be worn day of surgery Patient instructed to bring photo id and insurance card day of surgery Patient aware to have Driver (ride ) / caregiver    Audrey Christian spouse will stay for 24 hours after surgery  Patient Special Instructions -----none Pre-Op special Istructions -----none Patient verbalized understanding of instructions that were given at this phone interview. Patient denies shortness of breath, chest pain, fever, cough at this phone interview.
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
## 2021-03-26 ENCOUNTER — Encounter (HOSPITAL_BASED_OUTPATIENT_CLINIC_OR_DEPARTMENT_OTHER): Payer: Self-pay | Admitting: Obstetrics

## 2021-03-26 ENCOUNTER — Ambulatory Visit (HOSPITAL_BASED_OUTPATIENT_CLINIC_OR_DEPARTMENT_OTHER): Payer: 59 | Admitting: Anesthesiology

## 2021-03-26 ENCOUNTER — Ambulatory Visit (HOSPITAL_BASED_OUTPATIENT_CLINIC_OR_DEPARTMENT_OTHER)
Admission: RE | Admit: 2021-03-26 | Discharge: 2021-03-26 | Disposition: A | Discharge status: Home / Self Care | Payer: 59 | Attending: Obstetrics | Admitting: Obstetrics

## 2021-03-26 ENCOUNTER — Encounter (HOSPITAL_BASED_OUTPATIENT_CLINIC_OR_DEPARTMENT_OTHER)
Admission: RE | Disposition: A | Discharge status: Home / Self Care | Payer: Self-pay | Source: Home / Self Care | Attending: Obstetrics

## 2021-03-26 ENCOUNTER — Other Ambulatory Visit: Payer: Self-pay

## 2021-03-26 DIAGNOSIS — N840 Polyp of corpus uteri: Secondary | ICD-10-CM | POA: Diagnosis not present

## 2021-03-26 DIAGNOSIS — N950 Postmenopausal bleeding: Secondary | ICD-10-CM | POA: Diagnosis not present

## 2021-03-26 DIAGNOSIS — Z79890 Hormone replacement therapy (postmenopausal): Secondary | ICD-10-CM | POA: Insufficient documentation

## 2021-03-26 DIAGNOSIS — K5909 Other constipation: Secondary | ICD-10-CM

## 2021-03-26 DIAGNOSIS — Z8616 Personal history of COVID-19: Secondary | ICD-10-CM | POA: Insufficient documentation

## 2021-03-26 HISTORY — DX: Personal history of urinary calculi: Z87.442

## 2021-03-26 HISTORY — PX: HYSTEROSCOPY WITH D & C: SHX1775

## 2021-03-26 HISTORY — DX: Unspecified asthma, uncomplicated: J45.909

## 2021-03-26 SURGERY — DILATATION AND CURETTAGE /HYSTEROSCOPY
Anesthesia: General | Site: Vagina

## 2021-03-26 MED ORDER — MIDAZOLAM HCL 2 MG/2ML IJ SOLN
INTRAMUSCULAR | Status: AC
  Filled 2021-03-26: qty 2

## 2021-03-26 MED ORDER — OXYCODONE HCL 5 MG PO TABS
5.0000 mg | ORAL_TABLET | Freq: Once | ORAL | Status: AC | PRN
  Administered 2021-03-26: 5 mg via ORAL

## 2021-03-26 MED ORDER — KETOROLAC TROMETHAMINE 30 MG/ML IJ SOLN
INTRAMUSCULAR | Status: DC | PRN
  Administered 2021-03-26: 30 mg via INTRAVENOUS

## 2021-03-26 MED ORDER — PROPOFOL 10 MG/ML IV BOLUS
INTRAVENOUS | Status: DC | PRN
  Administered 2021-03-26: 180 mg via INTRAVENOUS

## 2021-03-26 MED ORDER — FENTANYL CITRATE (PF) 100 MCG/2ML IJ SOLN
INTRAMUSCULAR | Status: AC
  Filled 2021-03-26: qty 2

## 2021-03-26 MED ORDER — ONDANSETRON HCL 4 MG/2ML IJ SOLN
INTRAMUSCULAR | Status: DC | PRN
  Administered 2021-03-26: 4 mg via INTRAVENOUS

## 2021-03-26 MED ORDER — OXYCODONE HCL 5 MG PO TABS
ORAL_TABLET | ORAL | Status: AC
  Filled 2021-03-26: qty 1

## 2021-03-26 MED ORDER — SODIUM CHLORIDE 0.9 % IR SOLN
Status: DC | PRN
  Administered 2021-03-26: 3000 mL

## 2021-03-26 MED ORDER — DEXAMETHASONE SODIUM PHOSPHATE 10 MG/ML IJ SOLN
INTRAMUSCULAR | Status: AC
  Filled 2021-03-26: qty 1

## 2021-03-26 MED ORDER — FENTANYL CITRATE (PF) 100 MCG/2ML IJ SOLN: 25.0000 ug | INTRAMUSCULAR | Status: DC | PRN

## 2021-03-26 MED ORDER — LACTATED RINGERS IV SOLN: INTRAVENOUS | Status: DC

## 2021-03-26 MED ORDER — FENTANYL CITRATE (PF) 100 MCG/2ML IJ SOLN
INTRAMUSCULAR | Status: DC | PRN
  Administered 2021-03-26: 50 ug via INTRAVENOUS
  Administered 2021-03-26: 25 ug via INTRAVENOUS
  Administered 2021-03-26: 50 ug via INTRAVENOUS

## 2021-03-26 MED ORDER — PROPOFOL 10 MG/ML IV BOLUS
INTRAVENOUS | Status: AC
  Filled 2021-03-26: qty 20

## 2021-03-26 MED ORDER — LACTATED RINGERS IV SOLN: INTRAVENOUS | Status: DC | PRN

## 2021-03-26 MED ORDER — OXYCODONE HCL 5 MG/5ML PO SOLN: 5.0000 mg | Freq: Once | ORAL | Status: AC | PRN

## 2021-03-26 MED ORDER — MIDAZOLAM HCL 2 MG/2ML IJ SOLN
INTRAMUSCULAR | Status: DC | PRN
  Administered 2021-03-26: 2 mg via INTRAVENOUS

## 2021-03-26 MED ORDER — ONDANSETRON HCL 4 MG/2ML IJ SOLN
INTRAMUSCULAR | Status: AC
  Filled 2021-03-26: qty 2

## 2021-03-26 MED ORDER — DEXAMETHASONE SODIUM PHOSPHATE 10 MG/ML IJ SOLN
INTRAMUSCULAR | Status: DC | PRN
  Administered 2021-03-26: 10 mg via INTRAVENOUS

## 2021-03-26 MED ORDER — LIDOCAINE HCL 1 % IJ SOLN
INTRAMUSCULAR | Status: DC | PRN
  Administered 2021-03-26: 10 mL

## 2021-03-26 MED ORDER — FLUORESCEIN SODIUM 10 % IV SOLN
INTRAVENOUS | Status: AC
  Filled 2021-03-26: qty 5

## 2021-03-26 MED ORDER — LIDOCAINE HCL (PF) 2 % IJ SOLN
INTRAMUSCULAR | Status: AC
  Filled 2021-03-26: qty 5

## 2021-03-26 MED ORDER — ONDANSETRON HCL 4 MG/2ML IJ SOLN: 4.0000 mg | Freq: Once | INTRAMUSCULAR | Status: DC | PRN

## 2021-03-26 SURGICAL SUPPLY — 19 items
CATH ROBINSON RED A/P 16FR (CATHETERS) ×2 IMPLANT
CNTNR URN SCR LID CUP LEK RST (MISCELLANEOUS) ×2 IMPLANT
CONT SPEC 4OZ STRL OR WHT (MISCELLANEOUS)
COVER WAND RF STERILE (DRAPES) ×2 IMPLANT
DEVICE MYOSURE REACH (MISCELLANEOUS) ×1 IMPLANT
GAUZE 4X4 16PLY RFD (DISPOSABLE) ×2 IMPLANT
GLOVE SURG ENC MOIS LTX SZ7 (GLOVE) ×1 IMPLANT
GLOVE SURG LTX SZ6 (GLOVE) ×2 IMPLANT
GLOVE SURG UNDER POLY LF SZ6 (GLOVE) ×2 IMPLANT
GLOVE SURG UNDER POLY LF SZ7 (GLOVE) ×3 IMPLANT
GOWN STRL REUS W/TWL LRG LVL3 (GOWN DISPOSABLE) ×5 IMPLANT
IV NS IRRIG 3000ML ARTHROMATIC (IV SOLUTION) ×2 IMPLANT
KIT PROCEDURE FLUENT (KITS) ×2 IMPLANT
KIT TURNOVER CYSTO (KITS) ×2 IMPLANT
PACK VAGINAL MINOR WOMEN LF (CUSTOM PROCEDURE TRAY) ×2 IMPLANT
PAD OB MATERNITY 4.3X12.25 (PERSONAL CARE ITEMS) ×2 IMPLANT
SEAL ROD LENS SCOPE MYOSURE (ABLATOR) ×2 IMPLANT
TOWEL OR 17X26 10 PK STRL BLUE (TOWEL DISPOSABLE) ×2 IMPLANT
WATER STERILE IRR 500ML POUR (IV SOLUTION) ×1 IMPLANT

## 2021-03-26 NOTE — Op Note (Signed)
Operative Note  Pre-operative Diagnosis: recurrent postmenopausal bleeding and thickened endometrium  Post-operative Diagnosis: endometrial polyp  Surgeon: Marlow Baars, MD  Procedure: hysteroscopy, polypectomy with MyoSure, dilation and curettage  Anesthesia: general  Estimated Blood Loss: 5 mL            Specimens: endometrial curettings and polyp to pathology         Findings: Anteverted uterus.  One endometrial polyp arising from posterior uterus near fundus.  The endometrium is otherwise thin and atrophic.  There was no evidence of submucosal fibroid as suggested by ultrasound.    Description of Procedure:         After adequate anesthesia was achieved, the patient placed in the dorsal lithotomy position in Bishop stirrups.  She was prepped and draped in the usual sterile fashion.  A bimanual exam revealed an anteverted uterus.  The bivalve speculum was placed in the vagina and the anterior lip of the cervix grasped with a single-tooth tenaculum.  The cervix was serially dilated with Hank dilators to 54mm.  Under direct visualization, the hysteroscope was advanced.  The uterine cavity was surveyed. Both tubal ostia were identified. The endometrium was noted to be thin and atrophic.  There was one endometrial polyp arising from the posterior uterus near fundus.  The MyoSure device was advanced into the uterine cavity.  Under direct visualization, the polyp was completely resected with the MyoSure.  The hysteroscope was removed and a sharp curettage was performed.  The hysteroscope was advanced back into the cavity, and a uniform endometrial cavity seen without residual polyps.  The polyp and endometrial curettings were sent to pathology.  The hysteroscope was removed.  All the vaginal instruments were removed.  Counts were correct.  The patient was awakened from anesthesia and transferred to PACU in stable condition.    [place]

## 2021-03-26 NOTE — Transfer of Care (Signed)
Immediate Anesthesia Transfer of Care Note  Patient: Audrey Christian  Procedure(s) Performed: DILATATION AND CURETTAGE /HYSTEROSCOPY WITH POLYPECTOMY (Vagina )  Patient Location: PACU  Anesthesia Type:General  Level of Consciousness: awake, alert  and oriented  Airway & Oxygen Therapy: Patient Spontanous Breathing and Patient connected to face mask oxygen  Post-op Assessment: Report given to RN and Post -op Vital signs reviewed and stable  Post vital signs: Reviewed and stable  Last Vitals:  Vitals Value Taken Time  BP 140/70 03/26/21 1200  Temp    Pulse 78 03/26/21 1203  Resp 14 03/26/21 1203  SpO2 100 % 03/26/21 1203  Vitals shown include unvalidated device data.  Last Pain:  Vitals:   03/26/21 0933  TempSrc: Oral         Complications: No notable events documented.

## 2021-03-26 NOTE — Anesthesia Procedure Notes (Signed)
Procedure Name: LMA Insertion Date/Time: 03/26/2021 11:33 AM Performed by: Karen Kitchens, CRNA Pre-anesthesia Checklist: Patient identified, Emergency Drugs available, Suction available and Patient being monitored Patient Re-evaluated:Patient Re-evaluated prior to induction Oxygen Delivery Method: Circle system utilized Preoxygenation: Pre-oxygenation with 100% oxygen Induction Type: IV induction Ventilation: Mask ventilation without difficulty LMA: LMA inserted LMA Size: 4.0 Number of attempts: 1 Airway Equipment and Method: Bite block Placement Confirmation: positive ETCO2 Tube secured with: Tape Dental Injury: Teeth and Oropharynx as per pre-operative assessment

## 2021-03-26 NOTE — Anesthesia Preprocedure Evaluation (Addendum)
Anesthesia Evaluation  Patient identified by MRN, date of birth, ID band Patient awake    Reviewed: Allergy & Precautions, NPO status , Patient's Chart, lab work & pertinent test results  Airway Mallampati: II  TM Distance: >3 FB Neck ROM: Full    Dental no notable dental hx. (+) Implants, Dental Advisory Given, Caps   Pulmonary asthma ,  Covid 19- 09/2020- resolved   Pulmonary exam normal breath sounds clear to auscultation       Cardiovascular negative cardio ROS Normal cardiovascular exam Rhythm:Regular Rate:Normal     Neuro/Psych negative neurological ROS  negative psych ROS   GI/Hepatic Neg liver ROS, IBS   Endo/Other  Hypothyroidism Obesity  Renal/GU Renal diseaseHx/o renal calculi  negative genitourinary   Musculoskeletal negative musculoskeletal ROS (+)   Abdominal (+) + obese,   Peds  Hematology  (+) anemia ,   Anesthesia Other Findings   Reproductive/Obstetrics PMB Endometrial polyp                            Anesthesia Physical Anesthesia Plan  ASA: 2  Anesthesia Plan: General   Post-op Pain Management:    Induction:   PONV Risk Score and Plan: 4 or greater and Treatment may vary due to age or medical condition, Midazolam, Ondansetron and Dexamethasone  Airway Management Planned: LMA  Additional Equipment:   Intra-op Plan:   Post-operative Plan: Extubation in OR  Informed Consent: I have reviewed the patients History and Physical, chart, labs and discussed the procedure including the risks, benefits and alternatives for the proposed anesthesia with the patient or authorized representative who has indicated his/her understanding and acceptance.     Dental advisory given  Plan Discussed with: Anesthesiologist and CRNA  Anesthesia Plan Comments:        Anesthesia Quick Evaluation

## 2021-03-26 NOTE — Anesthesia Postprocedure Evaluation (Signed)
Anesthesia Post Note  Patient: Audrey Christian  Procedure(s) Performed: DILATATION AND CURETTAGE /HYSTEROSCOPY WITH POLYPECTOMY (Vagina )     Patient location during evaluation: PACU Anesthesia Type: General Level of consciousness: awake and alert and oriented Pain management: pain level controlled Vital Signs Assessment: post-procedure vital signs reviewed and stable Respiratory status: spontaneous breathing, nonlabored ventilation and respiratory function stable Cardiovascular status: blood pressure returned to baseline and stable Postop Assessment: no apparent nausea or vomiting Anesthetic complications: no   No notable events documented.  Last Vitals:  Vitals:   03/26/21 1215 03/26/21 1230  BP: 138/76 135/78  Pulse: 62 (!) 56  Resp: 14 13  Temp:    SpO2: 96% 96%    Last Pain:  Vitals:   03/26/21 1230  TempSrc:   PainSc: 7                  [name] A.

## 2021-03-26 NOTE — Discharge Instructions (Addendum)
  Post Anesthesia Home Care Instructions  Activity: Get plenty of rest for the remainder of the day. A responsible individual must stay with you for 24 hours following the procedure.  For the next 24 hours, DO NOT: -Drive a car -Advertising copywriter -Drink alcoholic beverages -Take any medication unless instructed by your physician -Make any legal decisions or sign important papers.  Meals: Start with liquid foods such as gelatin or soup. Progress to regular foods as tolerated. Avoid greasy, spicy, heavy foods. If nausea and/or vomiting occur, drink only clear liquids until the nausea and/or vomiting subsides. Call your physician if vomiting continues.  Special Instructions/Symptoms: Your throat may feel dry or sore from the anesthesia or the breathing tube placed in your throat during surgery. If this causes discomfort, gargle with warm salt water. The discomfort should disappear within 24 hours.      No ibuprofen, Advil, Aleve, Motrin, or naproxen until after 6 pm today if needed.    DISCHARGE INSTRUCTIONS: D&C / D&E The following instructions have been prepared to help you care for yourself upon your return home.   Personal hygiene:  Use sanitary pads for vaginal drainage, not tampons.  Shower the day after your procedure.  NO tub baths, pools or Jacuzzis for 2-3 weeks.  Wipe front to back after using the bathroom.  Activity and limitations:  Do NOT drive or operate any equipment for 24 hours. The effects of anesthesia are still present and drowsiness may result.  Do NOT rest in bed all day.  Walking is encouraged.  Walk up and down stairs slowly.  You may resume your normal activity in one to two days or as indicated by your physician.  Sexual activity: NO intercourse for at least 2 weeks after the procedure, or as indicated by your physician.  Diet: Eat a light meal as desired this evening. You may resume your usual diet tomorrow.  Return to work: You may resume your work  activities in one to two days or as indicated by your doctor.  What to expect after your surgery: Expect to have vaginal bleeding/discharge for 2-3 days and spotting for up to 10 days. It is not unusual to have soreness for up to 1-2 weeks. You may have a slight burning sensation when you urinate for the first day. Mild cramps may continue for a couple of days. You may have a regular period in 2-6 weeks.  Call your doctor for any of the following:  Excessive vaginal bleeding, saturating and changing one pad every hour.  Inability to urinate 6 hours after discharge from hospital.  Pain not relieved by pain medication.  Fever of 100.4 F or greater.  Unusual vaginal discharge or odor.

## 2021-03-27 ENCOUNTER — Encounter (HOSPITAL_BASED_OUTPATIENT_CLINIC_OR_DEPARTMENT_OTHER): Payer: Self-pay | Admitting: Obstetrics

## 2021-03-27 LAB — SURGICAL PATHOLOGY

## 2021-03-29 ENCOUNTER — Encounter: Payer: Self-pay | Admitting: Emergency Medicine

## 2021-03-29 ENCOUNTER — Telehealth: Payer: 59 | Admitting: Emergency Medicine

## 2021-03-29 DIAGNOSIS — B372 Candidiasis of skin and nail: Secondary | ICD-10-CM | POA: Diagnosis not present

## 2021-03-29 MED ORDER — CLOTRIMAZOLE-BETAMETHASONE 1-0.05 % EX CREA: 1.0000 "application " | TOPICAL_CREAM | Freq: Two times a day (BID) | CUTANEOUS | 0 refills | Status: AC

## 2021-03-29 NOTE — Progress Notes (Signed)
Audrey Christian, Audrey Christian are scheduled for a virtual visit with your provider today.    Just as we do with appointments in the office, we must obtain your consent to participate.  Your consent will be active for this visit and any virtual visit you may have with one of our providers in the next 365 days.    If you have a MyChart account, I can also send a copy of this consent to you electronically.  All virtual visits are billed to your insurance company just like a traditional visit in the office.  As this is a virtual visit, video technology does not allow for your provider to perform a traditional examination.  This may limit your provider's ability to fully assess your condition.  If your provider identifies any concerns that need to be evaluated in person or the need to arrange testing such as labs, EKG, etc, we will make arrangements to do so.    Although advances in technology are sophisticated, we cannot ensure that it will always work on either your end or our end.  If the connection with a video visit is poor, we may have to switch to a telephone visit.  With either a video or telephone visit, we are not always able to ensure that we have a secure connection.   I need to obtain your verbal consent now.   Are you willing to proceed with your visit today?   Audrey Christian has provided verbal consent on 03/29/2021 for a virtual visit (video or telephone).   Audrey Shadow, PA-C 03/29/2021  11:40 AM   Date:  03/29/2021   ID:  Audrey Christian, DOB Mar 10, 1971, MRN [id number]  Patient Location: Home Provider Location: Home Office   Participants: Patient and Provider for Visit and Wrap up  Method of visit: Video  Location of Patient: Home Location of Provider: Home Office Consent was obtain for visit over the video. Services rendered by provider: Visit was performed via video  A video enabled telemedicine application was used and I verified that I am speaking with the correct person using two  identifiers.  PCP:  Georgina Quint, MD   Chief Complaint:  rash under breasts  History of Present Illness:    Audrey Christian is a 50 y.o. female with history as stated below. Presents video telehealth for an acute care visit  Onset of symptoms was today and symptoms have been persistent and include: itching, burning redness under both breasts.  Hx of a skin yeast infection on her abdomen over a year ago so she did apply leftover medications: Fluticasone and Ketoconazole, triple ointment "butt paste" but states the creams have only worsened the itching.   She reports having outpatient surgery to remove a polyp on her uterus 3 days ago.  She was prescribed hydrocodone and has taken tylenol and ibuprofen but no other new medications.  No antibiotics.   Denies having fevers, chills, nausea, vomiting, diarrhea. No other rashes.   No other aggravating or relieving factors.  No other c/o.  Past Medical, Surgical, Social History, Allergies, and Medications have been Reviewed.  Patient Active Problem List   Diagnosis Date Noted   Polyp, corpus uteri 06/12/2015   Multinodular goiter 04/01/2015   Iron deficiency anemia 12/13/2012   Hypothyroidism 06/03/2007   ASTHMA 06/03/2007    Social History   Tobacco Use   Smoking status: Never   Smokeless tobacco: Never  Substance Use Topics   Alcohol use: No  Current Outpatient Medications:    acetaminophen (TYLENOL) 500 MG tablet, Take 1,000 mg by mouth every 6 (six) hours as needed., Disp: , Rfl:    albuterol (VENTOLIN HFA) 108 (90 Base) MCG/ACT inhaler, Inhale 2 puffs into the lungs every 4 (four) hours as needed for wheezing or shortness of breath (cough, shortness of breath or wheezing.)., Disp: 18 g, Rfl: 5   Calcipotriene-Betameth Diprop (WYNZORA) 0.005-0.064 % CREA, Apply topically as needed. psorriasis, Disp: , Rfl:    Calcium Carbonate-Vitamin D 600-400 MG-UNIT tablet, Take 2 tablets by mouth daily., Disp: , Rfl:     Ciclopirox 1 % shampoo, as needed., Disp: , Rfl: 2   clobetasol (TEMOVATE) 0.05 % external solution, APPLY TO AFFECTED AREA ON SCALP TWICE A DAY AS NEEDED, Disp: , Rfl: 2   estradiol (ESTRACE) 0.5 MG tablet, Take 1 mg by mouth daily., Disp: , Rfl:    fexofenadine (ALLEGRA) 180 MG tablet, Take 180 mg by mouth daily., Disp: , Rfl:    fluticasone (FLONASE) 50 MCG/ACT nasal spray, Place 2 sprays into both nostrils daily. (Patient taking differently: Place 2 sprays into both nostrils as needed.), Disp: 16 mL, Rfl: 0   levothyroxine (SYNTHROID) 75 MCG tablet, TAKE 1 TABLET BY MOUTH EVERY DAY, Disp: 90 tablet, Rfl: 1   loratadine (CLARITIN) 10 MG tablet, Take 10 mg by mouth daily., Disp: , Rfl:    medroxyPROGESTERone (PROVERA) 2.5 MG tablet, Take 2.5 mg by mouth daily., Disp: , Rfl:    Multiple Vitamin (MULTIVITAMIN) tablet, Take 1 tablet by mouth daily., Disp: , Rfl:    OVER THE COUNTER MEDICATION, 2 tabs daily in am, Disp: , Rfl:    polyethylene glycol powder (GLYCOLAX/MIRALAX) powder, TAKE 17 GRAMS BY MOUTH DAILY (Patient taking differently: daily. 1 cap daily), Disp: 527 g, Rfl: 0   Allergies  Allergen Reactions   Erythromycin     REACTION: Upset Stomach   Latex Itching     ROS See HPI for history of present illness.  Physical Exam Constitutional:      General: She is not in acute distress.    Appearance: Normal appearance. She is obese. She is not ill-appearing, toxic-appearing or diaphoretic.  HENT:     Head: Normocephalic.  Eyes:     Extraocular Movements: Extraocular movements intact.  Pulmonary:     Effort: Pulmonary effort is normal. No respiratory distress.  Musculoskeletal:     Cervical back: Normal range of motion.  Skin:    Findings: Erythema present.     Comments: Under both breasts: erythematous moist appearing macular rash. Minimally tender pt patient. No swelling per pt. Skin feels soft per pt.   Neurological:     Mental Status: She is alert.  Psychiatric:         Mood and Affect: Mood normal.        Behavior: Behavior normal.              A&P  Tinea cruris  Clotrimazole-betamethasone- apply 1 application twice daily for 7 days. Follow up with PCP in 3-4 days if not improving, sooner if worsening- fever develops, or redness spreading.    Patient voiced understanding and agreement to plan.   Time:   Today, I have spent 15 minutes with the patient with telehealth technology discussing the above problems, reviewing the chart, previous notes, medications and orders.    Tests Ordered: No orders of the defined types were placed in this encounter.   Medication Changes: No orders of the defined types  were placed in this encounter.    Disposition:  Follow up with PCP later this week as needed. Discussed urgent/emergent symptoms.   Cherly Hensen, PA-C  03/29/2021 11:40 AM

## 2021-04-09 ENCOUNTER — Ambulatory Visit (HOSPITAL_COMMUNITY)
Admission: EM | Admit: 2021-04-09 | Discharge: 2021-04-09 | Disposition: A | Discharge status: Home / Self Care | Payer: 59 | Attending: Family Medicine | Admitting: Family Medicine

## 2021-04-09 ENCOUNTER — Encounter (HOSPITAL_COMMUNITY): Payer: Self-pay

## 2021-04-09 ENCOUNTER — Other Ambulatory Visit: Payer: Self-pay

## 2021-04-09 DIAGNOSIS — L304 Erythema intertrigo: Secondary | ICD-10-CM

## 2021-04-09 MED ORDER — FLUCONAZOLE 100 MG PO TABS: ORAL_TABLET | ORAL | 0 refills | Status: DC

## 2021-04-09 MED ORDER — KETOCONAZOLE 2 % EX CREA: 1.0000 "application " | TOPICAL_CREAM | Freq: Every day | CUTANEOUS | 1 refills | Status: AC
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
## 2021-04-09 NOTE — ED Triage Notes (Signed)
Pt presents with vaginal itching and states she has been irritable.

## 2021-05-20 ENCOUNTER — Ambulatory Visit (HOSPITAL_COMMUNITY)
Admission: RE | Admit: 2021-05-20 | Discharge: 2021-05-20 | Disposition: A | Discharge status: Home / Self Care | Payer: 59 | Source: Ambulatory Visit | Attending: Medical Oncology | Admitting: Medical Oncology

## 2021-05-20 ENCOUNTER — Other Ambulatory Visit: Payer: Self-pay

## 2021-05-20 ENCOUNTER — Encounter (HOSPITAL_COMMUNITY): Payer: Self-pay

## 2021-05-20 VITALS — BP 132/75 | HR 71 | Temp 98.8°F | Resp 17

## 2021-05-20 DIAGNOSIS — Z881 Allergy status to other antibiotic agents status: Secondary | ICD-10-CM | POA: Diagnosis not present

## 2021-05-20 DIAGNOSIS — R509 Fever, unspecified: Secondary | ICD-10-CM | POA: Insufficient documentation

## 2021-05-20 DIAGNOSIS — Z8709 Personal history of other diseases of the respiratory system: Secondary | ICD-10-CM | POA: Insufficient documentation

## 2021-05-20 DIAGNOSIS — U071 COVID-19: Secondary | ICD-10-CM | POA: Insufficient documentation

## 2021-05-20 DIAGNOSIS — J069 Acute upper respiratory infection, unspecified: Secondary | ICD-10-CM

## 2021-05-20 LAB — SARS CORONAVIRUS 2 (TAT 6-24 HRS): SARS Coronavirus 2: POSITIVE — AB

## 2021-05-20 MED ORDER — BENZONATATE 100 MG PO CAPS: 100.0000 mg | ORAL_CAPSULE | Freq: Three times a day (TID) | ORAL | 0 refills | Status: DC

## 2021-05-20 MED ORDER — ALBUTEROL SULFATE HFA 108 (90 BASE) MCG/ACT IN AERS: 2.0000 | INHALATION_SPRAY | RESPIRATORY_TRACT | 5 refills | Status: DC | PRN

## 2021-05-20 MED ORDER — FLUTICASONE PROPIONATE 50 MCG/ACT NA SUSP: 2.0000 | Freq: Every day | NASAL | 0 refills | Status: DC

## 2021-05-20 NOTE — ED Triage Notes (Signed)
Pt presents with non productive cough, generalized body aches, headache, and chills for past few days.
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
## 2021-07-10 ENCOUNTER — Other Ambulatory Visit: Payer: Self-pay

## 2021-07-10 ENCOUNTER — Telehealth: Payer: Self-pay | Admitting: Endocrinology

## 2021-07-10 DIAGNOSIS — E039 Hypothyroidism, unspecified: Secondary | ICD-10-CM

## 2021-07-10 MED ORDER — LEVOTHYROXINE SODIUM 75 MCG PO TABS: 75.0000 ug | ORAL_TABLET | Freq: Every day | ORAL | 3 refills | Status: DC

## 2021-07-10 MED ORDER — LEVOTHYROXINE SODIUM 75 MCG PO TABS: 75.0000 ug | ORAL_TABLET | Freq: Every day | ORAL | 1 refills | Status: DC

## 2021-07-10 NOTE — Telephone Encounter (Signed)
Rx sent 

## 2021-07-10 NOTE — Telephone Encounter (Signed)
MEDICATION: Levothyroxine 75 mcg  PHARMACY:  [facility] on [address]  HAS THE PATIENT CONTACTED THEIR PHARMACY? yes   IS THIS A 90 DAY SUPPLY : No  IS PATIENT OUT OF MEDICATION: not yet  IF NOT; HOW MUCH IS LEFT:   LAST APPOINTMENT DATE: @10 /27/2021  NEXT APPOINTMENT DATE:@10 /27/2022  DO WE HAVE YOUR PERMISSION TO LEAVE A DETAILED MESSAGE?: yes    **Let patient know to contact pharmacy at the end of the day to make sure medication is ready. **  ** Please notify patient to allow 48-72 hours to process**  **Encourage patient to contact the pharmacy for refills or they can request refills through [facility]**

## 2021-07-10 NOTE — Telephone Encounter (Signed)
Patient's Levothyroxine has been sent to [facility] on [address], patient informed as well.

## 2021-07-30 ENCOUNTER — Other Ambulatory Visit: Payer: Self-pay

## 2021-07-30 ENCOUNTER — Ambulatory Visit (INDEPENDENT_AMBULATORY_CARE_PROVIDER_SITE_OTHER): Payer: 59 | Admitting: Endocrinology

## 2021-07-30 VITALS — BP 130/74 | HR 75 | Ht 64.0 in | Wt 203.2 lb

## 2021-07-30 DIAGNOSIS — E039 Hypothyroidism, unspecified: Secondary | ICD-10-CM | POA: Diagnosis not present

## 2021-07-30 NOTE — Patient Instructions (Signed)
Thyroid blood tests are requested for you today.  We'll let you know about the results.   Please come back for a follow-up appointment in 1 year.   
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
## 2021-07-31 LAB — TSH: TSH: 2.36 u[IU]/mL (ref 0.35–5.50)

## 2021-07-31 LAB — T4, FREE: Free T4: 0.9 ng/dL (ref 0.60–1.60)

## 2021-08-02 ENCOUNTER — Emergency Department (HOSPITAL_BASED_OUTPATIENT_CLINIC_OR_DEPARTMENT_OTHER)
Admission: EM | Admit: 2021-08-02 | Discharge: 2021-08-02 | Disposition: A | Discharge status: Home / Self Care | Payer: 59 | Attending: Emergency Medicine | Admitting: Emergency Medicine

## 2021-08-02 ENCOUNTER — Emergency Department (HOSPITAL_BASED_OUTPATIENT_CLINIC_OR_DEPARTMENT_OTHER): Payer: 59

## 2021-08-02 ENCOUNTER — Other Ambulatory Visit: Payer: Self-pay

## 2021-08-02 ENCOUNTER — Encounter (HOSPITAL_BASED_OUTPATIENT_CLINIC_OR_DEPARTMENT_OTHER): Payer: Self-pay

## 2021-08-02 DIAGNOSIS — N189 Chronic kidney disease, unspecified: Secondary | ICD-10-CM | POA: Diagnosis not present

## 2021-08-02 DIAGNOSIS — J45909 Unspecified asthma, uncomplicated: Secondary | ICD-10-CM | POA: Insufficient documentation

## 2021-08-02 DIAGNOSIS — R1011 Right upper quadrant pain: Secondary | ICD-10-CM | POA: Insufficient documentation

## 2021-08-02 DIAGNOSIS — Z91040 Latex allergy status: Secondary | ICD-10-CM | POA: Diagnosis not present

## 2021-08-02 DIAGNOSIS — R197 Diarrhea, unspecified: Secondary | ICD-10-CM | POA: Insufficient documentation

## 2021-08-02 DIAGNOSIS — E039 Hypothyroidism, unspecified: Secondary | ICD-10-CM | POA: Diagnosis not present

## 2021-08-02 DIAGNOSIS — Z79899 Other long term (current) drug therapy: Secondary | ICD-10-CM | POA: Diagnosis not present

## 2021-08-02 DIAGNOSIS — Z8616 Personal history of COVID-19: Secondary | ICD-10-CM | POA: Insufficient documentation

## 2021-08-02 DIAGNOSIS — R1084 Generalized abdominal pain: Secondary | ICD-10-CM

## 2021-08-02 DIAGNOSIS — R1013 Epigastric pain: Secondary | ICD-10-CM | POA: Diagnosis present

## 2021-08-02 LAB — COMPREHENSIVE METABOLIC PANEL
ALT: 32 U/L (ref 0–44)
AST: 22 U/L (ref 15–41)
Albumin: 4.2 g/dL (ref 3.5–5.0)
Alkaline Phosphatase: 43 U/L (ref 38–126)
Anion gap: 10 (ref 5–15)
BUN: 12 mg/dL (ref 6–20)
CO2: 25 mmol/L (ref 22–32)
Calcium: 9.5 mg/dL (ref 8.9–10.3)
Chloride: 102 mmol/L (ref 98–111)
Creatinine, Ser: 0.78 mg/dL (ref 0.44–1.00)
GFR, Estimated: >60 mL/min (ref >60)
Glucose, Bld: 87 mg/dL (ref 70–99)
Potassium: 4.3 mmol/L (ref 3.5–5.1)
Sodium: 137 mmol/L (ref 135–145)
Total Bilirubin: 0.4 mg/dL (ref 0.3–1.2)
Total Protein: 7.5 g/dL (ref 6.5–8.1)

## 2021-08-02 LAB — URINALYSIS, ROUTINE W REFLEX MICROSCOPIC
Bilirubin Urine: NEGATIVE
Glucose, UA: NEGATIVE mg/dL
Hgb urine dipstick: NEGATIVE
Ketones, ur: 15 mg/dL — AB
Leukocytes,Ua: NEGATIVE
Nitrite: NEGATIVE
Protein, ur: NEGATIVE mg/dL
Specific Gravity, Urine: 1.018 (ref 1.005–1.030)
pH: 5.5 (ref 5.0–8.0)

## 2021-08-02 LAB — LIPASE, BLOOD: Lipase: 20 U/L (ref 11–51)

## 2021-08-02 LAB — CBC
HCT: 39.6 % (ref 36.0–46.0)
Hemoglobin: 13.3 g/dL (ref 12.0–15.0)
MCH: 29.8 pg (ref 26.0–34.0)
MCHC: 33.6 g/dL (ref 30.0–36.0)
MCV: 88.8 fL (ref 80.0–100.0)
Platelets: 311 10*3/uL (ref 150–400)
RBC: 4.46 MIL/uL (ref 3.87–5.11)
RDW: 13.4 % (ref 11.5–15.5)
WBC: 5.3 10*3/uL (ref 4.0–10.5)
nRBC: 0 % (ref 0.0–0.2)

## 2021-08-02 MED ORDER — DICYCLOMINE HCL 20 MG PO TABS: 20.0000 mg | ORAL_TABLET | Freq: Two times a day (BID) | ORAL | 0 refills | Status: DC | PRN

## 2021-08-02 MED ORDER — IOHEXOL 300 MG/ML  SOLN
100.0000 mL | Freq: Once | INTRAMUSCULAR | Status: AC | PRN
  Administered 2021-08-02: 100 mL via INTRAVENOUS
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
## 2021-08-02 NOTE — ED Notes (Signed)
Dc instructions reviewed with patient . Pt states understanding.

## 2021-08-02 NOTE — ED Triage Notes (Signed)
Pt presents with abd bloating and lower back ache x3 days. Pt reports a hx of IBS, noticed intermittent light pink color to her stool while wiping x1 month  Pt is unable to see her IBS doctor until mid December.  Last BM this am, not a normal BM, but had a lot of BM's yesterday to where she was having "clear liquid". Pt is passing gas. Pt reports she took 4 caps of mirilax today

## 2021-08-02 NOTE — Discharge Instructions (Signed)
Your blood work and your CT scan of your abdomen was reassuring.  It was without any signs of infection or other inflammatory processes.  I have sent in Bentyl to your pharmacy.  You can take Tylenol as well for pain control.  If you develop worsening abdominal pain, rectal bleeding, nausea vomiting that is uncontrolled at home and are unable to keep any food or drink down please return to the emergency department for evaluation.  Otherwise I recommend you follow-up with your primary care provider within the next 2 to 3 days.

## 2021-08-17 ENCOUNTER — Encounter: Payer: Self-pay | Admitting: Adult Health

## 2021-08-17 ENCOUNTER — Other Ambulatory Visit: Payer: Self-pay

## 2021-08-17 ENCOUNTER — Telehealth: Payer: Self-pay | Admitting: Emergency Medicine

## 2021-08-17 ENCOUNTER — Other Ambulatory Visit (HOSPITAL_COMMUNITY)
Admission: RE | Admit: 2021-08-17 | Discharge: 2021-08-17 | Disposition: A | Discharge status: Home / Self Care | Payer: 59 | Source: Ambulatory Visit | Attending: Adult Health | Admitting: Adult Health

## 2021-08-17 ENCOUNTER — Ambulatory Visit (INDEPENDENT_AMBULATORY_CARE_PROVIDER_SITE_OTHER): Payer: 59 | Admitting: Adult Health

## 2021-08-17 VITALS — BP 132/88 | HR 65 | Temp 96.9°F | Ht 64.02 in | Wt 197.2 lb

## 2021-08-17 DIAGNOSIS — R102 Pelvic and perineal pain: Secondary | ICD-10-CM

## 2021-08-17 DIAGNOSIS — N950 Postmenopausal bleeding: Secondary | ICD-10-CM | POA: Diagnosis not present

## 2021-08-17 DIAGNOSIS — Z8719 Personal history of other diseases of the digestive system: Secondary | ICD-10-CM | POA: Insufficient documentation

## 2021-08-17 DIAGNOSIS — N939 Abnormal uterine and vaginal bleeding, unspecified: Secondary | ICD-10-CM | POA: Diagnosis not present

## 2021-08-17 NOTE — Telephone Encounter (Signed)
Patient wants to inform provider she was seen today by Marvell Fuller

## 2021-08-17 NOTE — Telephone Encounter (Signed)
Needs office visit to further assess the situation.  Thanks.

## 2021-08-17 NOTE — Telephone Encounter (Signed)
Patient called c/o bleeding and abdominal pain, started again on Saturday 11.12.22, was seen in the ED a few weeks ago with the same issue, was supposed to follow-up with Sagardia in two to three days after  Patient is postmenopausal, not able to sleep due to pain

## 2021-08-17 NOTE — Patient Instructions (Addendum)
Ultrasound ordered. PAP results should return in 72 hours. Follow up with Gynecology. If you would like GI referral please let us know, sooner than your December appointment.   Abnormal Uterine Bleeding Abnormal uterine bleeding means bleeding more than normal from your womb (uterus). It can include: Bleeding after sex. Bleeding between monthly (menstrual) periods. Bleeding that is heavier than normal. Monthly periods that last longer than normal. Bleeding after you have stopped having your monthly period (menopause). You should see a doctor for any kind of bleeding that is not normal. Treatment depends on the cause of your bleeding and how much you bleed. Follow these instructions at home: Medicines Take over-the-counter and prescription medicines only as told by your doctor. Ask your doctor about: Taking medicines such as aspirin and ibuprofen. Do not take these medicines unless your doctor tells you to take them. Taking over-the-counter medicines, vitamins, herbs, and supplements. You may be given iron pills. Take them as told by your doctor. Managing constipation If you take iron pills, you may need to take these actions to prevent or treat trouble pooping (constipation): Drink enough fluid to keep your pee (urine) pale yellow. Take over-the-counter or prescription medicines. Eat foods that are high in fiber. These include beans, whole grains, and fresh fruits and vegetables. Limit foods that are high in fat and sugar. These include fried or sweet foods. Activity Change your activity to decrease bleeding if you need to change your sanitary pad more than one time every 2 hours: Lie in bed with your feet raised (elevated). Place a cold pack on your lower belly. Rest as much as you are able until the bleeding stops or slows down. General instructions Do not use tampons, douche, or have sex until your doctor says these things are okay. Change your pads often. Get regular exams. These  include: Pelvic exams. Screenings for cancer of the cervix. It is up to you to get the results of any tests that are done. Ask how to get your results when they are ready. Watch for any changes in your bleeding. For 2 months, write down: When your monthly period starts. When your monthly period ends. When you get any abnormal bleeding from your vagina. What problems you notice. Keep all follow-up visits. Contact a doctor if: The bleeding lasts more than one week. You feel dizzy at times. You feel like you may vomit (nausea). You vomit. You feel light-headed or weak. Your symptoms get worse. Get help right away if: You faint. You have to change pads every hour. You have pain in your belly. You have a fever or chills. You get sweaty or weak. You pass large blood clots from your vagina. These symptoms may be an emergency. Get help right away. Call your local emergency services (911 in the [place] Do not wait to see if the symptoms will go away. Do not drive yourself to the hospital. Summary Abnormal uterine bleeding means bleeding more than normal from your womb (uterus). Any kind of bleeding that is not normal should be checked by a doctor. Treatment depends on the cause of your bleeding and how much you bleed. Get help right away if you faint, you have to change pads every hour, or you pass large blood clots from your vagina. This information is not intended to replace advice given to you by your health care provider. Make sure you discuss any questions you have with your health care provider. Document Revised: 01/20/2021 Document Reviewed: 01/20/2021 Elsevier Patient Education  2022  [employer].

## 2021-08-17 NOTE — Telephone Encounter (Signed)
Thank you :)
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
## 2021-08-18 ENCOUNTER — Other Ambulatory Visit: Payer: Self-pay | Admitting: Adult Health

## 2021-08-18 DIAGNOSIS — N950 Postmenopausal bleeding: Secondary | ICD-10-CM

## 2021-08-18 DIAGNOSIS — R102 Pelvic and perineal pain: Secondary | ICD-10-CM

## 2021-08-18 LAB — POCT PREGNANCY, URINE: Pregnancy Test, Urine: NEGATIVE

## 2021-08-18 NOTE — Progress Notes (Signed)
Result entered.

## 2021-08-18 NOTE — Telephone Encounter (Signed)
Called and spoke with pt, she states that she seen another provider for the bleeding she was having.

## 2021-08-18 NOTE — Telephone Encounter (Signed)
[facility] FYI...   Caller states she is spotting and severe abdominal pain. She was seen in ER 2 weeks ago but she thought it was rectal bleeding. CT scan did not show anything. Told to follow up with PCP. She has decided that it is definitely vaginal bleeding.  Advised to be seen within 4 hrs. Triage nurse transferred to backline. Patient was scheduled with Marvell Fuller

## 2021-08-19 ENCOUNTER — Other Ambulatory Visit: Payer: Self-pay | Admitting: Adult Health

## 2021-08-19 ENCOUNTER — Telehealth: Payer: Self-pay

## 2021-08-19 ENCOUNTER — Telehealth: Payer: Self-pay | Admitting: Adult Health

## 2021-08-19 DIAGNOSIS — N390 Urinary tract infection, site not specified: Secondary | ICD-10-CM

## 2021-08-19 LAB — CULTURE, URINE COMPREHENSIVE
MICRO NUMBER:: 12633327
SPECIMEN QUALITY:: ADEQUATE

## 2021-08-19 MED ORDER — CEPHALEXIN 500 MG PO CAPS: 500.0000 mg | ORAL_CAPSULE | Freq: Four times a day (QID) | ORAL | 0 refills | Status: DC

## 2021-08-19 NOTE — Telephone Encounter (Signed)
Keflex reorded for pt to correct pharmacy

## 2021-08-19 NOTE — Progress Notes (Signed)
Urinary tract infection without hematuria, site unspecified - Plan: cephALEXin (KEFLEX) 500 MG capsule

## 2021-08-19 NOTE — Progress Notes (Signed)
I have already ordered this as a future order in the computer.    Orders Placed This Encounter  Procedures   Urine Culture   Urine Microscopic Only

## 2021-08-19 NOTE — Progress Notes (Signed)
Urine shows strep bacteria will treat with Keflex - sent to pharmacy. Follow up with primary care.

## 2021-08-19 NOTE — Telephone Encounter (Signed)
Patient to return in 2 weeks to retest for UTI after completion of antibiotic.

## 2021-08-19 NOTE — Progress Notes (Signed)
Culture in progress

## 2021-08-21 ENCOUNTER — Ambulatory Visit
Admission: RE | Admit: 2021-08-21 | Discharge: 2021-08-21 | Disposition: A | Discharge status: Home / Self Care | Payer: 59 | Source: Ambulatory Visit | Attending: Adult Health | Admitting: Adult Health

## 2021-08-23 NOTE — Progress Notes (Signed)
7.1 mm endometrial thickness. In the setting of post-menopausal bleeding, endometrial sampling is indicated to exclude carcinoma. If results are benign, sonohysterogram should be considered for focal lesion work-up.   Ultrasound shows endometrial thickening in the lining  please have her follow up within 2 weeks with her OBGYN of she does not have one we can refer.

## 2021-08-25 LAB — CYTOLOGY - PAP
Comment: NEGATIVE
Diagnosis: REACTIVE
High risk HPV: NEGATIVE

## 2021-08-25 NOTE — Progress Notes (Signed)
Pap negative, would advise follow up with PCP and gynecology given symptoms and past clinical history.

## 2021-08-26 ENCOUNTER — Telehealth: Payer: Self-pay

## 2021-08-26 NOTE — Telephone Encounter (Signed)
Pt returning call regarding labs °

## 2021-08-26 NOTE — Telephone Encounter (Signed)
See result note message 

## 2021-08-26 NOTE — Telephone Encounter (Signed)
LMTCB in regards to lab results.  

## 2021-09-04 ENCOUNTER — Other Ambulatory Visit: Payer: Self-pay

## 2021-09-04 ENCOUNTER — Other Ambulatory Visit (INDEPENDENT_AMBULATORY_CARE_PROVIDER_SITE_OTHER): Payer: 59

## 2021-09-04 DIAGNOSIS — N390 Urinary tract infection, site not specified: Secondary | ICD-10-CM

## 2021-09-04 LAB — URINALYSIS, MICROSCOPIC ONLY

## 2021-09-05 ENCOUNTER — Encounter: Payer: Self-pay | Admitting: Family Medicine

## 2021-09-06 LAB — URINE CULTURE
MICRO NUMBER:: 12707611
SPECIMEN QUALITY:: ADEQUATE

## 2021-09-07 ENCOUNTER — Other Ambulatory Visit: Payer: Self-pay | Admitting: Adult Health

## 2021-09-07 DIAGNOSIS — N390 Urinary tract infection, site not specified: Secondary | ICD-10-CM

## 2021-09-07 MED ORDER — PENICILLIN V POTASSIUM 500 MG PO TABS
500.0000 mg | ORAL_TABLET | Freq: Three times a day (TID) | ORAL | 0 refills | Status: AC
Start: 2021-09-07 — End: 2021-09-14

## 2021-09-07 NOTE — Progress Notes (Signed)
Meds ordered this encounter Medications  penicillin v potassium (VEETID) 500 MG tablet   Sig: Take 1 tablet (500 mg total) by mouth 3 (three) times daily for 7 days.   Dispense:  21 tablet   Refill:  0  Sent in medication above, please call and schedule a follow up with your primary care MD in 2 weeks. Return sooner if symptoms.

## 2021-09-07 NOTE — Progress Notes (Signed)
No orders of the defined types were placed in this encounter.  Meds ordered this encounter  Medications   penicillin v potassium (VEETID) 500 MG tablet    Sig: Take 1 tablet (500 mg total) by mouth 3 (three) times daily for 7 days.    Dispense:  21 tablet    Refill:  0

## 2021-09-17 ENCOUNTER — Telehealth: Payer: Self-pay | Admitting: Adult Health

## 2021-09-17 ENCOUNTER — Other Ambulatory Visit: Payer: Self-pay | Admitting: Adult Health

## 2021-09-17 DIAGNOSIS — N390 Urinary tract infection, site not specified: Secondary | ICD-10-CM

## 2021-09-17 NOTE — Telephone Encounter (Signed)
Orders Placed This Encounter  Procedures   Urine Culture    Standing Status:   Future    Standing Expiration Date:   09/17/2022   Urinalysis, Routine w reflex microscopic    Standing Status:   Future    Standing Expiration Date:   09/17/2022  Orders placed. Thank you

## 2021-09-17 NOTE — Progress Notes (Signed)
Orders Placed This Encounter  Procedures   Urine Culture    Standing Status:   Future    Standing Expiration Date:   09/17/2022   Urinalysis, Routine w reflex microscopic    Standing Status:   Future    Standing Expiration Date:   09/17/2022

## 2021-09-17 NOTE — Telephone Encounter (Signed)
Patient has a lab appt 09/21/2021, there are no orders in. °

## 2021-09-21 ENCOUNTER — Other Ambulatory Visit: Payer: Self-pay

## 2021-09-21 ENCOUNTER — Other Ambulatory Visit (INDEPENDENT_AMBULATORY_CARE_PROVIDER_SITE_OTHER): Payer: 59

## 2021-09-21 DIAGNOSIS — N390 Urinary tract infection, site not specified: Secondary | ICD-10-CM

## 2021-09-22 LAB — URINALYSIS, ROUTINE W REFLEX MICROSCOPIC
Bilirubin Urine: NEGATIVE
Hgb urine dipstick: NEGATIVE
Ketones, ur: NEGATIVE
Leukocytes,Ua: NEGATIVE
Nitrite: NEGATIVE
RBC / HPF: NONE SEEN (ref 0–?)
Specific Gravity, Urine: <=1.005 — AB (ref 1.000–1.030)
Total Protein, Urine: NEGATIVE
Urine Glucose: NEGATIVE
Urobilinogen, UA: 0.2 (ref 0.0–1.0)
WBC, UA: NONE SEEN (ref 0–?)
pH: 7 (ref 5.0–8.0)

## 2021-09-22 LAB — URINE CULTURE
MICRO NUMBER:: 12774150
SPECIMEN QUALITY:: ADEQUATE

## 2021-09-22 NOTE — Progress Notes (Signed)
Normal mixed flora on urine culture, no treatment indicated if no symptoms. If any symptoms persisting, changing or worsening at anytime please call the office for follow up or if office closed seek care at open medical facility.

## 2021-09-22 NOTE — Progress Notes (Signed)
Urinalysis without any bacteria now.  She should increase her water/hydration intake.

## 2021-12-21 IMAGING — US US PELVIS COMPLETE WITH TRANSVAGINAL
1 series · 13 of 25 positions shown · non-contrast
Comparison: None

CLINICAL DATA: Postmenopausal bleeding

EXAM:
TRANSABDOMINAL AND TRANSVAGINAL ULTRASOUND OF PELVIS
TECHNIQUE: Both transabdominal and transvaginal ultrasound examinations of the
pelvis were performed. Transabdominal technique was performed for
global imaging of the pelvis including uterus, ovaries, adnexal
regions, and pelvic cul-de-sac. It was necessary to proceed with
endovaginal exam following the transabdominal exam to visualize the
endometrium and ovaries.

[Series 1: us pelvis complete with transvaginal · 13 of 62 slices shown]
[im 1/62]
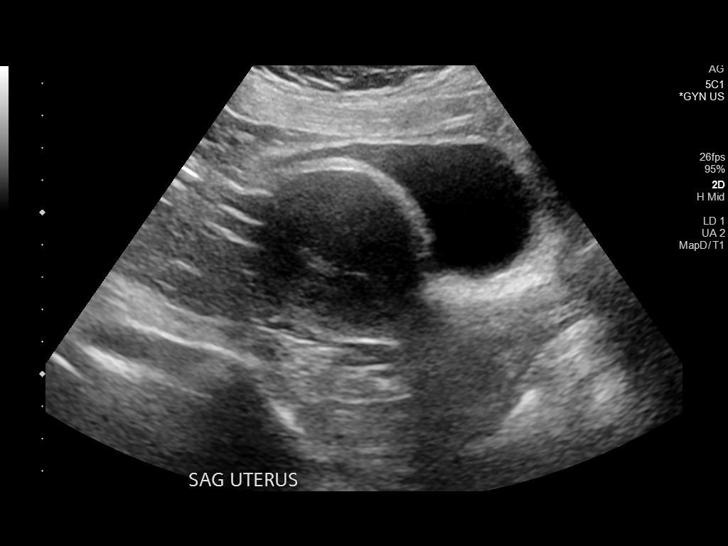
[im 6/62]
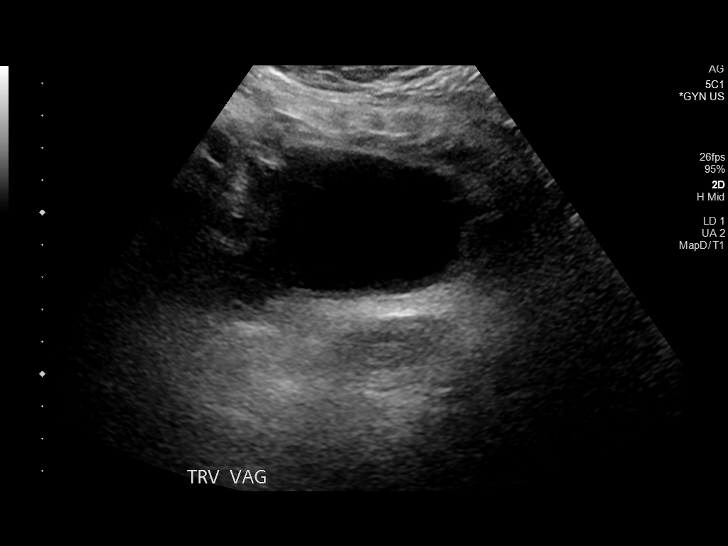
[im 11/62]
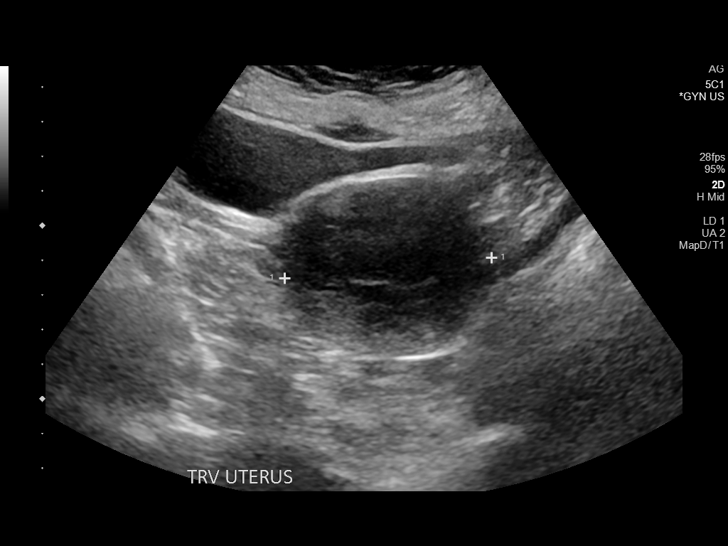
[im 16/62]
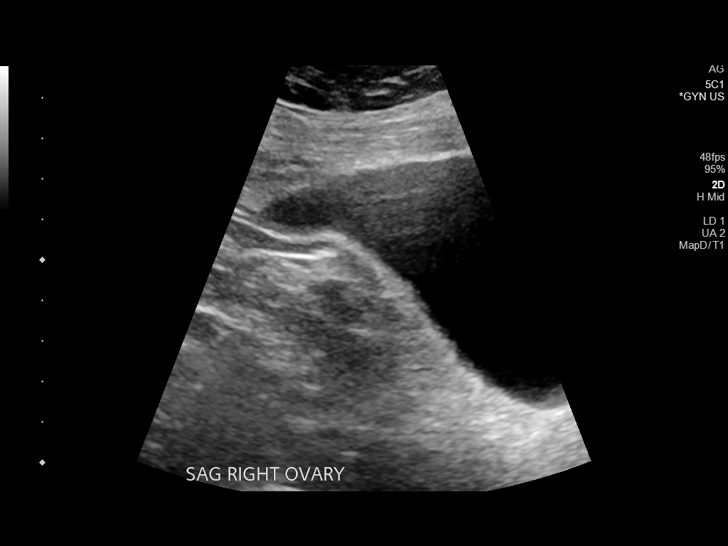
[im 21/62]
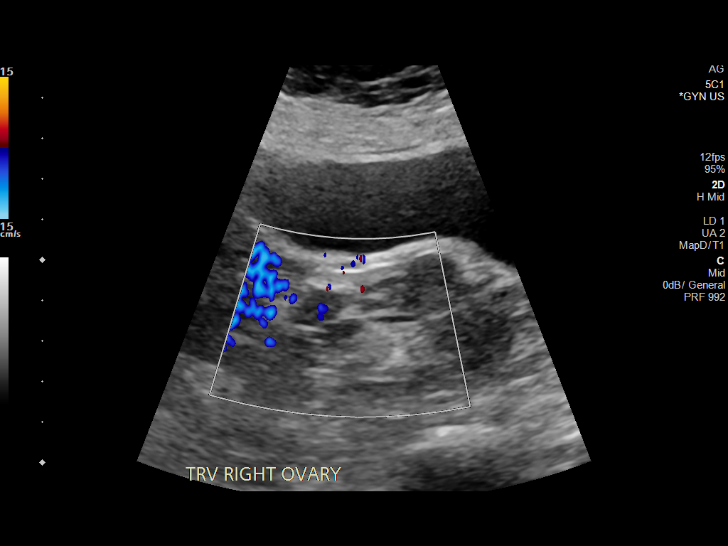
[im 26/62]
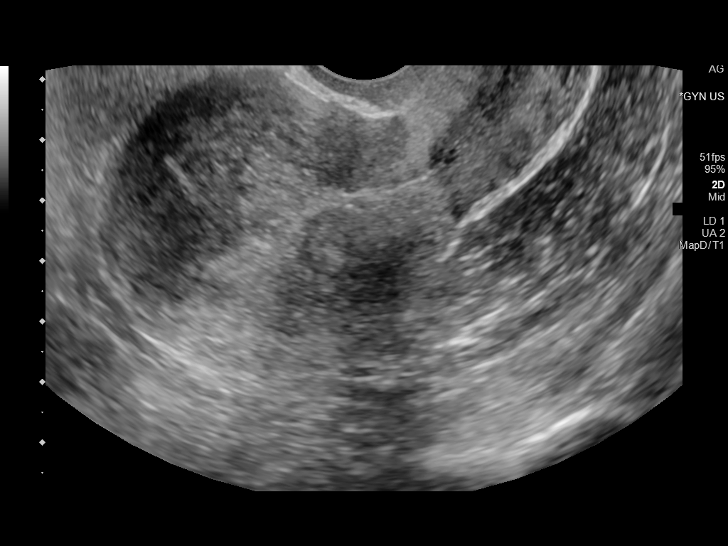
[im 31/62]
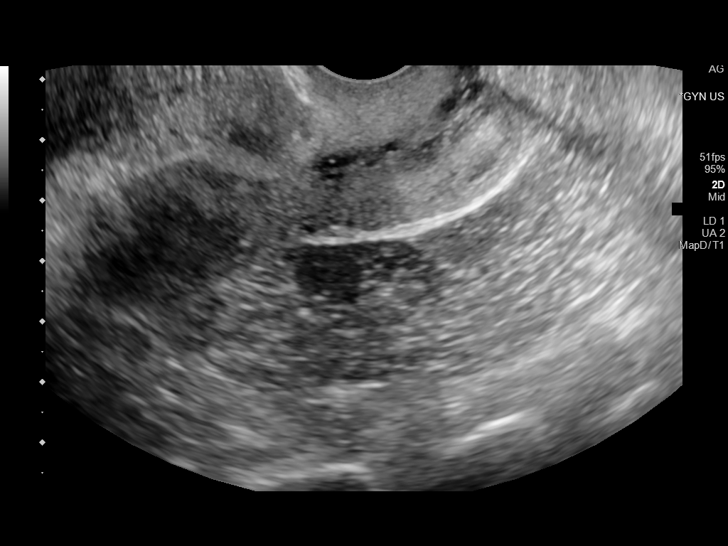
[im 36/62]
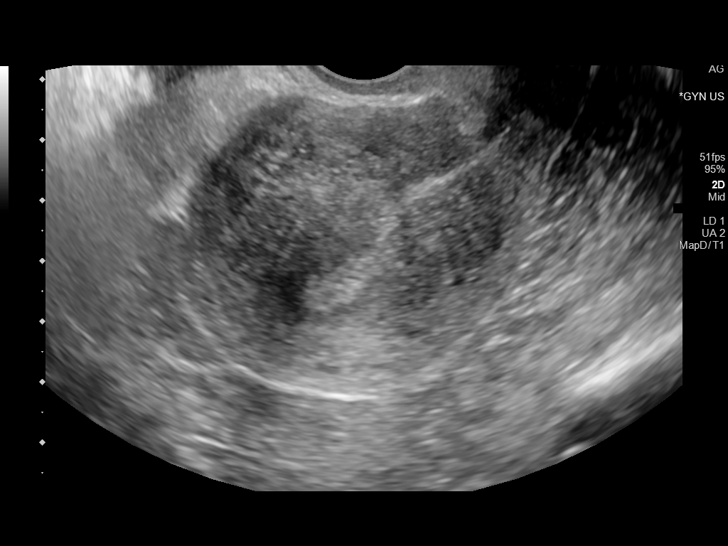
[im 41/62]
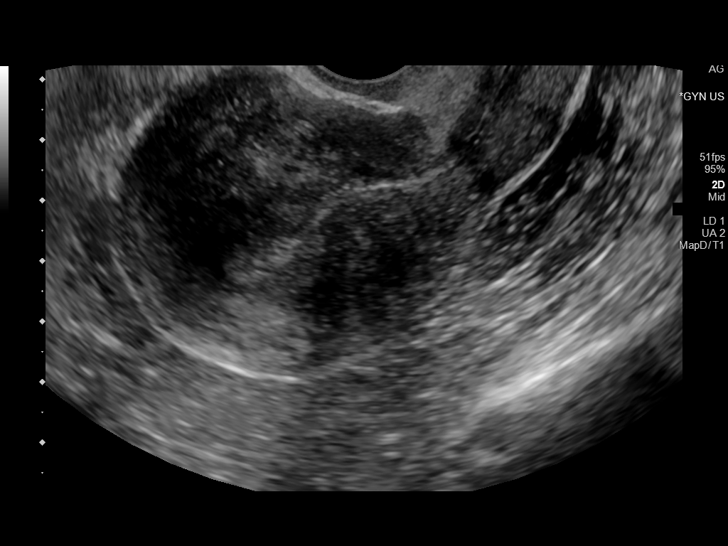
[im 46/62]
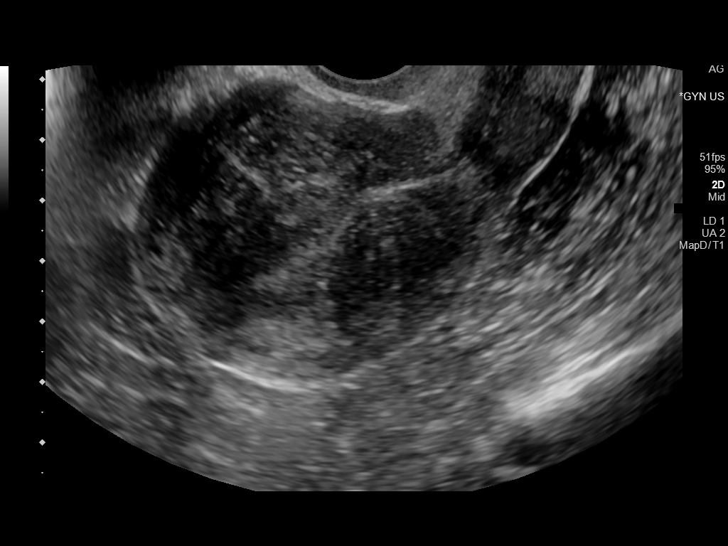
[im 51/62]
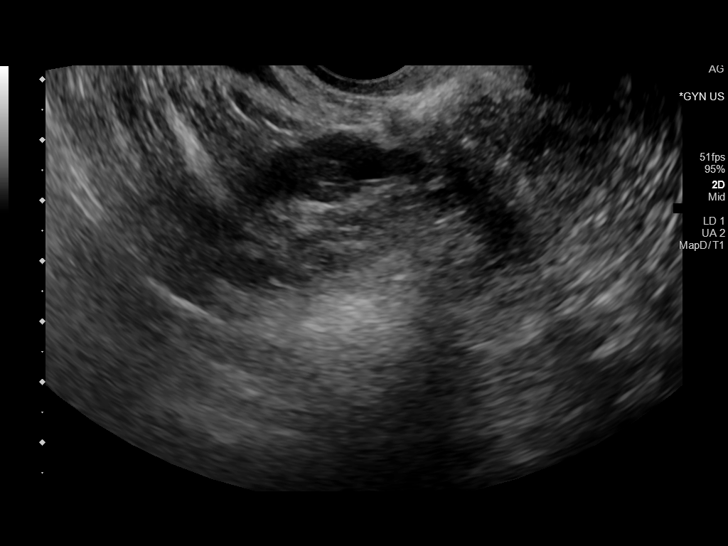
[im 56/62]
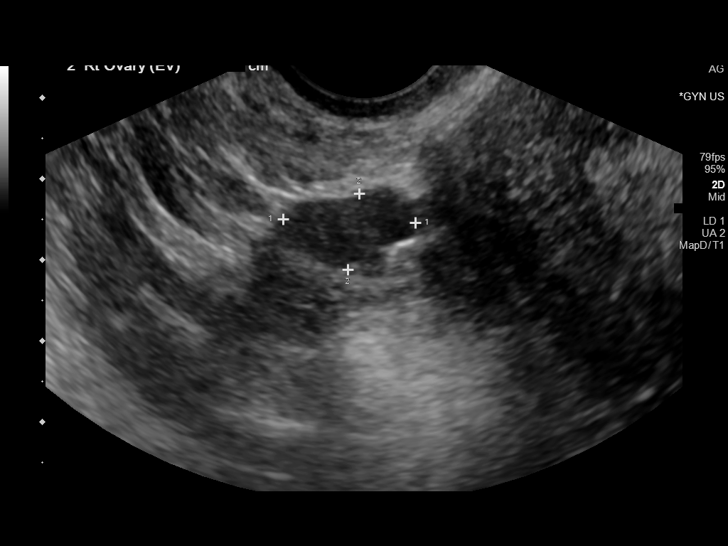
[im 62/62]
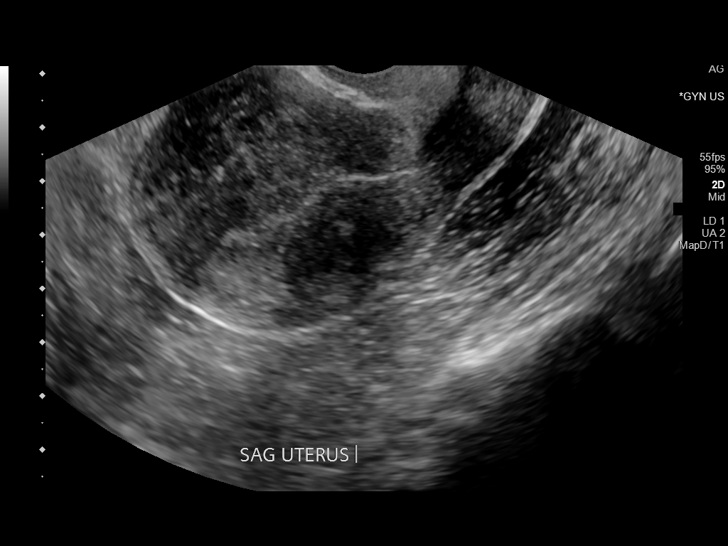

[13 of 25 positions shown; findings below may reference images not displayed]

FINDINGS: Uterus

Measurements: 9.6 x 4.8 x 6.0 cm = volume: 144 mL. Anteverted.
Heterogeneous myometrium. Poorly defined area of questionable
abnormal echogenicity at the mid uterus, submucosal, question 2.4 cm
diameter leiomyoma versus adenomyosis. Similar area of ill-defined
echogenicity at posterior mid uterus 1.8 cm diameter.

Endometrium

Thickness: 6 mm.  No endometrial fluid or focal abnormality

Right ovary

Measurements: 1.6 x 1.0 x 1.6 cm = volume: 1.3 mL. Normal morphology
without mass

Left ovary

Surgically absent

Other findings

No free pelvic fluid.  No adnexal masses.
IMPRESSION: Question 2 poorly defined submucosal leiomyomata versus adenomyosis
of the uterus.

Abnormal endometrial complex for a postmenopausal patient with
bleeding, measuring 6 mm thick; in the setting of post-menopausal
bleeding, endometrial sampling is indicated to exclude carcinoma. If
results are benign, sonohysterogram should be considered for focal
lesion work-up. (Ref: Radiological Reasoning: Algorithmic Workup of
Abnormal Vaginal Bleeding with Endovaginal Sonography and
Sonohysterography. AJR 9550; 191:S68-73)

These results will be called to the ordering clinician or
representative by the Radiologist Assistant, and communication
documented in the PACS or [REDACTED].

## 2021-12-31 LAB — HM COLONOSCOPY

## 2022-01-04 ENCOUNTER — Ambulatory Visit (INDEPENDENT_AMBULATORY_CARE_PROVIDER_SITE_OTHER): Payer: 59 | Admitting: Endocrinology

## 2022-01-04 VITALS — BP 124/86 | HR 72 | Ht 64.0 in | Wt 202.4 lb

## 2022-01-04 DIAGNOSIS — M791 Myalgia, unspecified site: Secondary | ICD-10-CM | POA: Diagnosis not present

## 2022-01-04 DIAGNOSIS — E039 Hypothyroidism, unspecified: Secondary | ICD-10-CM
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
## 2022-01-04 NOTE — Patient Instructions (Addendum)
Thyroid blood tests are requested for you today.  We'll let you know about the results.   ?It is best to never miss the levothyroxine.  However, if you do miss it, next best is to double up the next time.   ?Please come back for a follow-up appointment in 1 year.   ?

## 2022-01-05 LAB — TSH: TSH: 2.98 u[IU]/mL (ref 0.35–5.50)

## 2022-01-05 LAB — SEDIMENTATION RATE: Sed Rate: 14 mm/hr (ref 0–30)

## 2022-01-05 LAB — T4, FREE: Free T4: 0.95 ng/dL (ref 0.60–1.60)

## 2022-01-05 LAB — CK: Total CK: 65 U/L (ref 7–177)

## 2022-01-07 MED ORDER — LEVOTHYROXINE SODIUM 75 MCG PO TABS: 75.0000 ug | ORAL_TABLET | Freq: Every day | ORAL | 3 refills | Status: DC

## 2022-02-13 ENCOUNTER — Telehealth: Payer: 59 | Admitting: Nurse Practitioner

## 2022-02-13 ENCOUNTER — Other Ambulatory Visit: Payer: Self-pay | Admitting: Nurse Practitioner

## 2022-02-13 DIAGNOSIS — J019 Acute sinusitis, unspecified: Secondary | ICD-10-CM | POA: Diagnosis not present

## 2022-02-13 DIAGNOSIS — B9689 Other specified bacterial agents as the cause of diseases classified elsewhere: Secondary | ICD-10-CM

## 2022-02-13 MED ORDER — FLUTICASONE PROPIONATE 50 MCG/ACT NA SUSP: 2.0000 | Freq: Every day | NASAL | 0 refills | Status: DC

## 2022-02-13 MED ORDER — AMOXICILLIN-POT CLAVULANATE 875-125 MG PO TABS: 1.0000 | ORAL_TABLET | Freq: Two times a day (BID) | ORAL | 0 refills | Status: AC

## 2022-02-13 NOTE — Progress Notes (Signed)
I have spent 5 minutes in review of e-visit questionnaire, review and updating patient chart, medical decision making and response to patient.  ° °Audrey Christian W Janet Humphreys, NP ° °  °

## 2022-02-13 NOTE — Progress Notes (Signed)
E-Visit for Sinus Problems ? ?We are sorry that you are not feeling well.  Here is how we plan to help! ? ?Based on what you have shared with me it looks like you have sinusitis.  Sinusitis is inflammation and infection in the sinus cavities of the head.  Based on your presentation I believe you most likely have Acute Bacterial Sinusitis.  This is an infection caused by bacteria and is treated with antibiotics. I have prescribed Augmentin 875mg /125mg  one tablet twice daily with food, for 7 days.  As well as a flonase spray. You may use an oral decongestant such as Mucinex D or if you have glaucoma or high blood pressure use plain Mucinex. Saline nasal spray help and can safely be used as often as needed for congestion.  If you develop worsening sinus pain, fever or notice severe headache and vision changes, or if symptoms are not better after completion of antibiotic, please schedule an appointment with a health care provider.   ? ?Sinus infections are not as easily transmitted as other respiratory infection, however we still recommend that you avoid close contact with loved ones, especially the very young and elderly.  Remember to wash your hands thoroughly throughout the day as this is the number one way to prevent the spread of infection! ? ?Home Care: ?Only take medications as instructed by your medical team. ?Complete the entire course of an antibiotic. ?Do not take these medications with alcohol. ?A steam or ultrasonic humidifier can help congestion.  You can place a towel over your head and breathe in the steam from hot water coming from a faucet. ?Avoid close contacts especially the very young and the elderly. ?Cover your mouth when you cough or sneeze. ?Always remember to wash your hands. ? ?Get Help Right Away If: ?You develop worsening fever or sinus pain. ?You develop a severe head ache or visual changes. ?Your symptoms persist after you have completed your treatment plan. ? ?Make sure you ?Understand  these instructions. ?Will watch your condition. ?Will get help right away if you are not doing well or get worse. ? ?Thank you for choosing an e-visit. ? ?Your e-visit answers were reviewed by a board certified advanced clinical practitioner to complete your personal care plan. Depending upon the condition, your plan could have included both over the counter or prescription medications. ? ?Please review your pharmacy choice. Make sure the pharmacy is open so you can pick up prescription now. If there is a problem, you may contact your provider through [employer] and have the prescription routed to another pharmacy.  Your safety is important to [place]. If you have drug allergies check your prescription carefully.  ? ?For the next 24 hours you can use MyChart to ask questions about today's visit, request a non-urgent call back, or ask for a work or school excuse. ?You will get an email in the next two days asking about your experience. I hope that your e-visit has been valuable and will speed your recovery.  ?

## 2022-02-16 NOTE — Telephone Encounter (Signed)
Requested Prescriptions  ?Pending Prescriptions Disp Refills  ?? fluticasone (FLONASE) 50 MCG/ACT nasal spray [Pharmacy Med Name: FLUTICASONE NASAL SP (120) RX] 48 g   ?  Sig: SHAKE LIQUID AND USE 2 SPRAYS IN EACH NOSTRIL DAILY  ?  ? Ear, Nose, and Throat: Nasal Preparations - Corticosteroids Failed - 02/13/2022 10:30 AM  ?  ?  Failed - Valid encounter within last 12 months  ?  Recent Outpatient Visits   ?      ? 1 year ago Acute non-recurrent maxillary sinusitis  ? Primary Care at [facility], [place], [place]  ? 1 year ago Upper respiratory tract infection, unspecified type  ? Primary Care at [facility], Meda Coffee, MD  ? 1 year ago Cervical adenopathy  ? Primary Care at [facility], Sandria Bales, MD  ? 1 year ago Hypothyroidism, unspecified type  ? Primary Care at [facility], [place], [place]  ? 2 years ago Right lower quadrant abdominal pain  ? Primary Care at [facility] Eilleen Kempf, MD  ?  ?  ? ?  ?  ?  ? ?

## 2022-06-20 IMAGING — CT CT ABD-PELV W/ CM
2 of 5 series · 16 of 46 positions shown, 18 images · IV contrast (APPLIED)
Comparison: CT scan 08/07/2019

CLINICAL DATA: Abdominal pain.  History of ureteral bowel syndrome.

EXAM:
CT ABDOMEN AND PELVIS WITH CONTRAST
TECHNIQUE: Multidetector CT imaging of the abdomen and pelvis was performed
using the standard protocol following bolus administration of
intravenous contrast.
CONTRAST:  100mL OMNIPAQUE IOHEXOL 300 MG/ML  SOLN

[Series 2: abd pel w · axial · 0.79mm/px · z∈[-432,-22]mm · 13 of 94 slices shown, 15 images]
[im 6/94  soft-tissue]
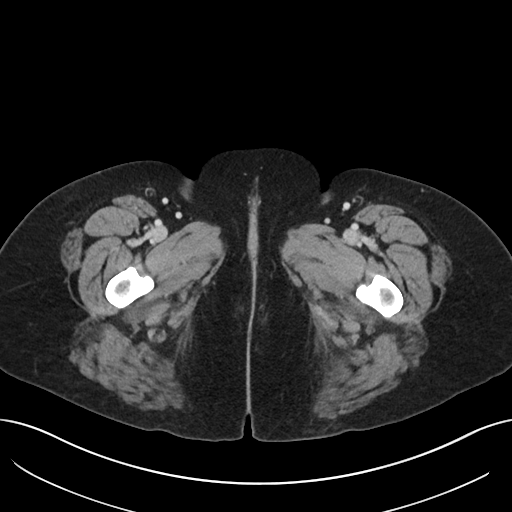
[im 6/94  bone]
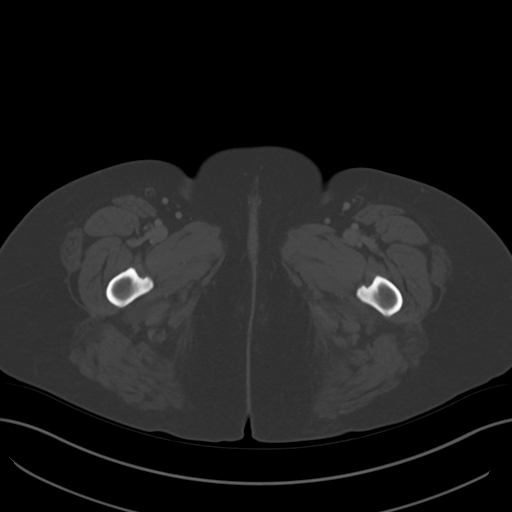
[im 11/94  soft-tissue]
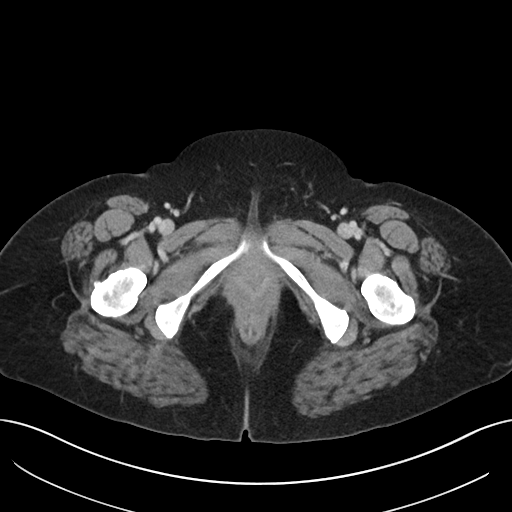
[im 21/94  soft-tissue]
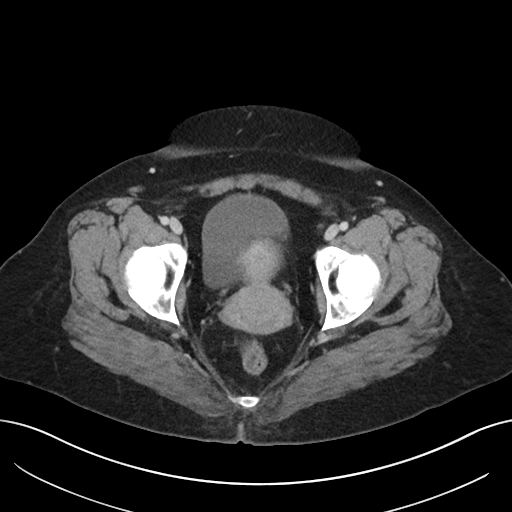
[im 26/94  soft-tissue]
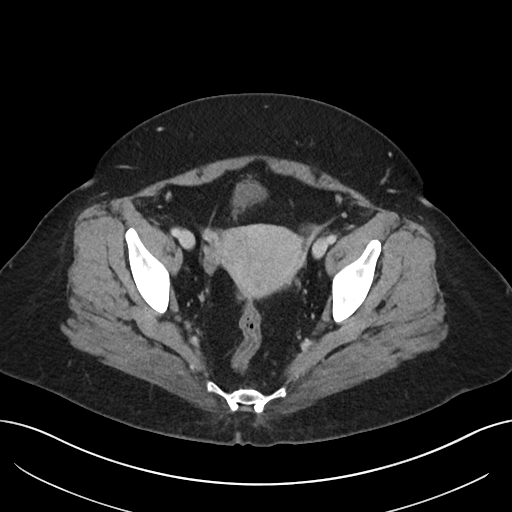
[im 32/94  soft-tissue]
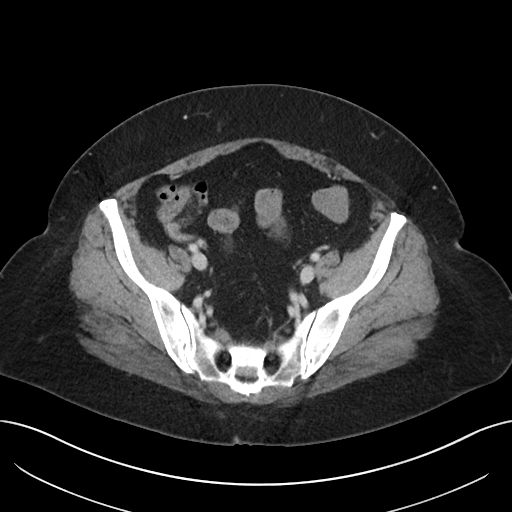
[im 42/94  soft-tissue]
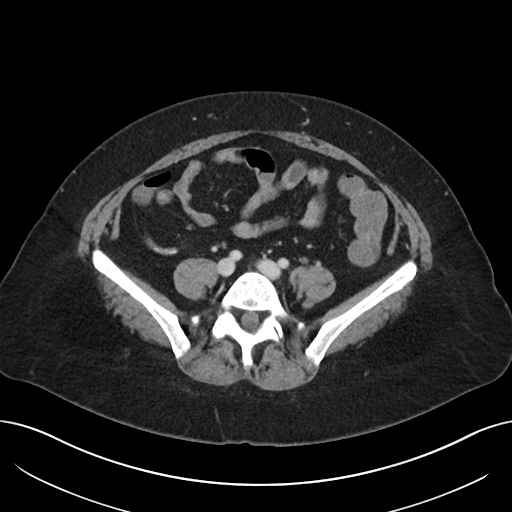
[im 47/94  soft-tissue]
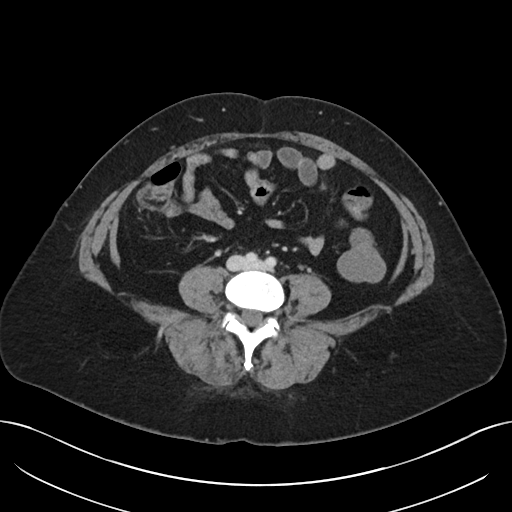
[im 52/94  soft-tissue]
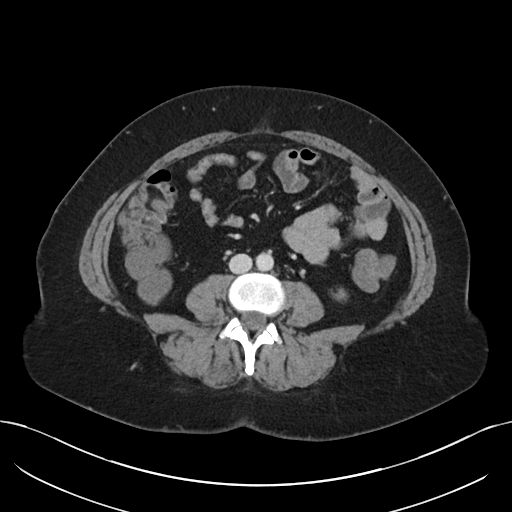
[im 63/94  soft-tissue]
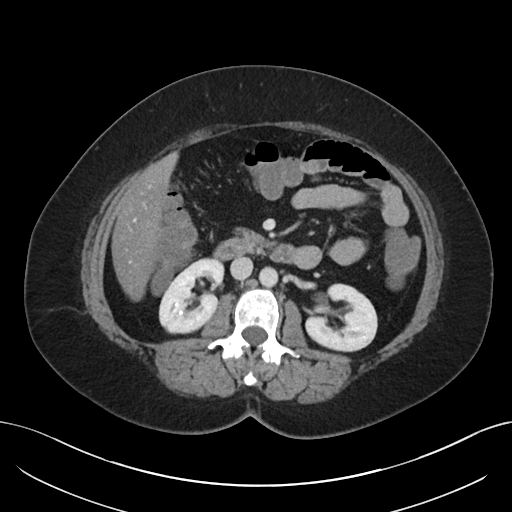
[im 63/94  bone]
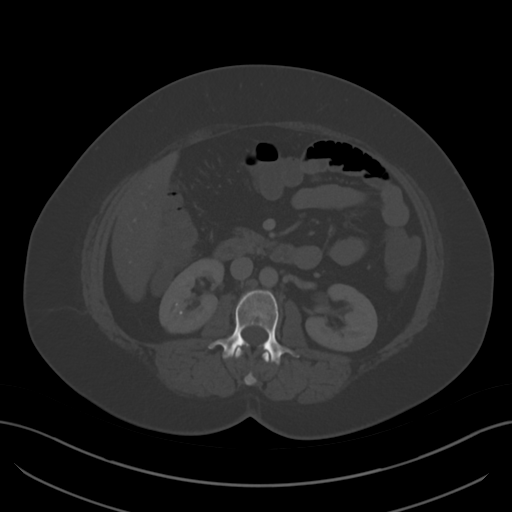
[im 68/94  soft-tissue]
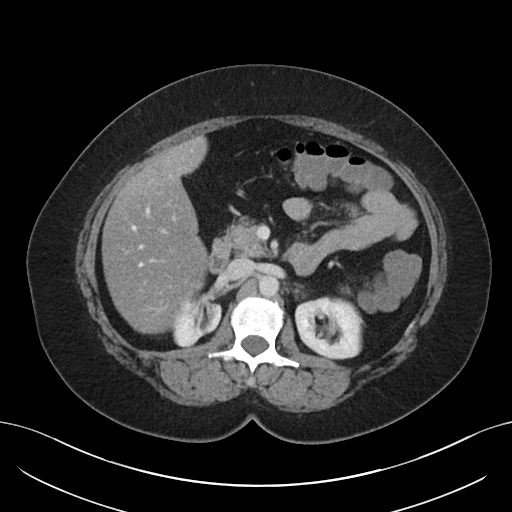
[im 73/94  soft-tissue]
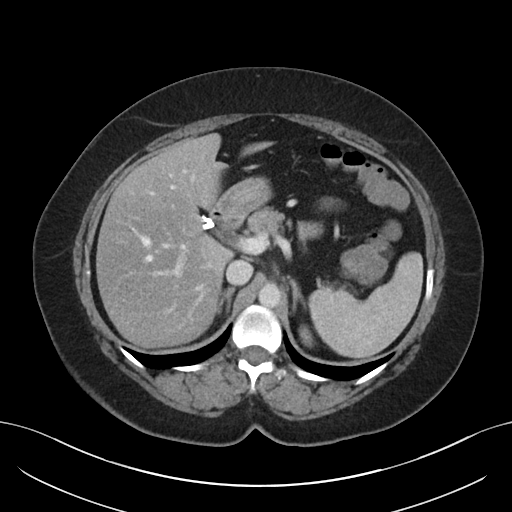
[im 83/94  soft-tissue]
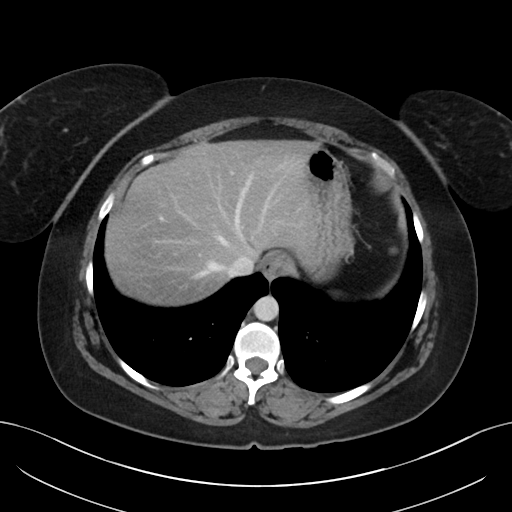
[im 88/94  soft-tissue]
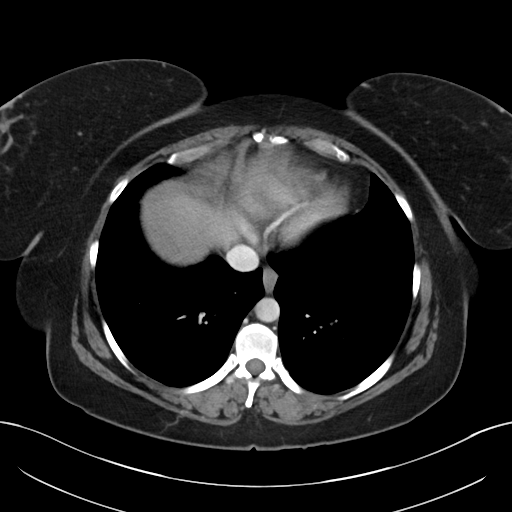

[Series 5: coronal · coronal · 0.86mm/px · 3 of 99 slices shown]
[im 33/99  soft-tissue]
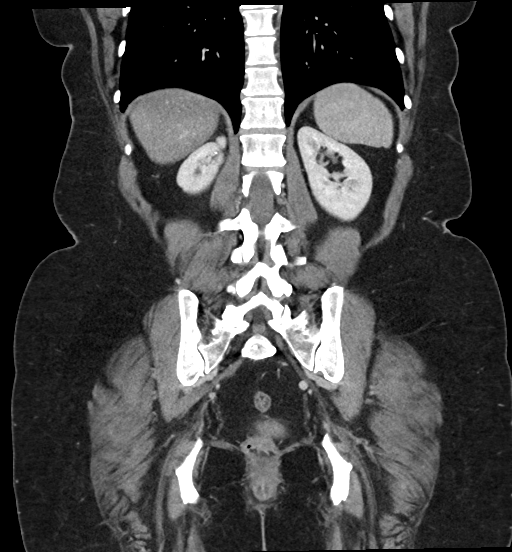
[im 44/99  soft-tissue]
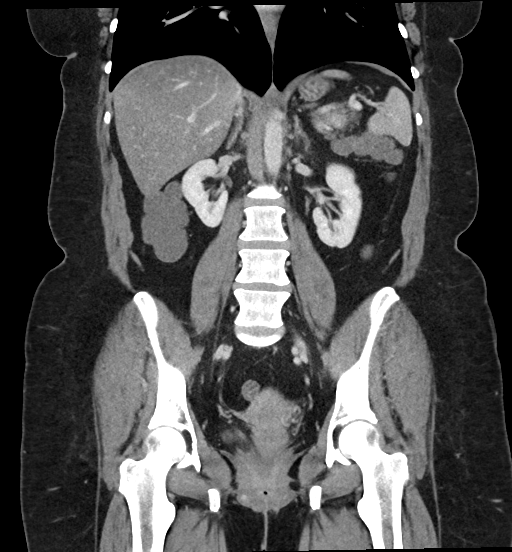
[im 55/99  soft-tissue]
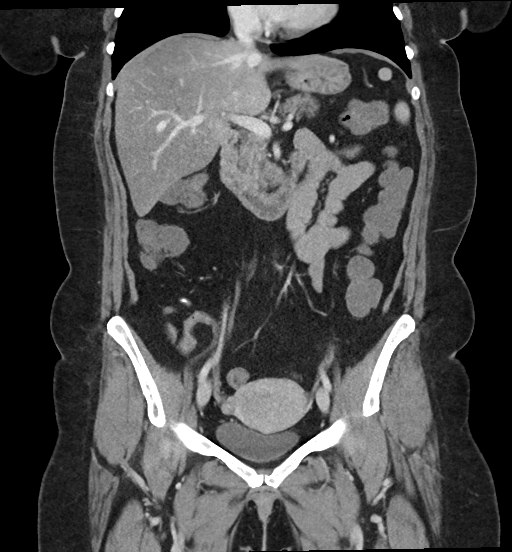

[16 of 46 positions shown; findings below may reference images not displayed]

FINDINGS: Lower chest: The lung bases are clear of acute process. No pleural
effusion or pulmonary lesions. The heart is normal in size. No
pericardial effusion. The distal esophagus and aorta are
unremarkable.

Hepatobiliary: Areas of geographic fatty infiltration but no focal
hepatic lesions or intrahepatic biliary dilatation. The gallbladder
is surgically absent. No common bile duct dilatation.

Pancreas: No mass, inflammation or ductal dilatation.

Spleen: Normal size.  No focal lesions.

Adrenals/Urinary Tract: Adrenal glands are normal.

Stable renal cortical scarring changes involving the right kidney.
No worrisome renal lesions or hydronephrosis. No evidence of acute
pyelonephritis. The bladder is unremarkable.

Stomach/Bowel: The stomach, duodenum and small bowel are
unremarkable. No acute inflammatory process, mass lesions or
obstructive findings. The terminal ileum and appendix are normal.
Tiny scattered appendicoliths are stable.

There is moderate fluid and air-fluid levels throughout the colon
typically seen with diarrhea. No colonic wall thickening or
pericolonic inflammatory changes.

Vascular/Lymphatic: The aorta is normal in caliber. No dissection.
The branch vessels are patent. The major venous structures are
patent. No mesenteric or retroperitoneal mass or adenopathy. Small
scattered lymph nodes are noted.

Reproductive: The uterus and ovaries are unremarkable. Small uterine
fibroids are noted.

Other: No pelvic mass or adenopathy. No free pelvic fluid
collections. No inguinal mass or adenopathy. No abdominal wall
hernia or subcutaneous lesions.

Musculoskeletal: No significant bony findings.
IMPRESSION: 1. Moderate fluid and air-fluid levels throughout the colon
typically seen with diarrhea. No colonic wall thickening or
pericolonic inflammatory changes.
2. No other significant abdominal/pelvic findings, mass lesions or
adenopathy.
3. Stable geographic fatty infiltration of the liver.
4. Status post cholecystectomy. No biliary dilatation.
5. Stable right renal cortical scarring changes.

## 2022-06-29 ENCOUNTER — Ambulatory Visit: Payer: 59 | Admitting: Emergency Medicine

## 2022-06-29 ENCOUNTER — Ambulatory Visit (INDEPENDENT_AMBULATORY_CARE_PROVIDER_SITE_OTHER): Payer: 59

## 2022-06-29 ENCOUNTER — Encounter: Payer: Self-pay | Admitting: Emergency Medicine

## 2022-06-29 VITALS — BP 138/86 | HR 66 | Temp 97.8°F | Ht 64.0 in | Wt 206.0 lb

## 2022-06-29 DIAGNOSIS — E039 Hypothyroidism, unspecified: Secondary | ICD-10-CM

## 2022-06-29 DIAGNOSIS — M2550 Pain in unspecified joint: Secondary | ICD-10-CM

## 2022-06-29 DIAGNOSIS — L409 Psoriasis, unspecified: Secondary | ICD-10-CM | POA: Insufficient documentation

## 2022-06-29 LAB — CBC WITH DIFFERENTIAL/PLATELET
Basophils Absolute: 0.1 10*3/uL (ref 0.0–0.1)
Basophils Relative: 1.8 % (ref 0.0–3.0)
Eosinophils Absolute: 0.2 10*3/uL (ref 0.0–0.7)
Eosinophils Relative: 3.9 % (ref 0.0–5.0)
HCT: 38.4 % (ref 36.0–46.0)
Hemoglobin: 13.1 g/dL (ref 12.0–15.0)
Lymphocytes Relative: 28.9 % (ref 12.0–46.0)
Lymphs Abs: 1.6 10*3/uL (ref 0.7–4.0)
MCHC: 34.1 g/dL (ref 30.0–36.0)
MCV: 90.7 fl (ref 78.0–100.0)
Monocytes Absolute: 0.8 10*3/uL (ref 0.1–1.0)
Monocytes Relative: 14.4 % — ABNORMAL HIGH (ref 3.0–12.0)
Neutro Abs: 2.9 10*3/uL (ref 1.4–7.7)
Neutrophils Relative %: 51 % (ref 43.0–77.0)
Platelets: 282 10*3/uL (ref 150.0–400.0)
RBC: 4.24 Mil/uL (ref 3.87–5.11)
RDW: 14.1 % (ref 11.5–15.5)
WBC: 5.7 10*3/uL (ref 4.0–10.5)

## 2022-06-29 LAB — SEDIMENTATION RATE: Sed Rate: 15 mm/hr (ref 0–30)
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
## 2022-06-29 NOTE — Assessment & Plan Note (Signed)
Clinically euthyroid.  TSH done today. Continue Synthroid 75 mcg daily. 

## 2022-06-29 NOTE — Assessment & Plan Note (Signed)
Possible rheumatism without arthritis. Blood work done today. May need rheumatology evaluation.

## 2022-06-29 NOTE — Assessment & Plan Note (Signed)
History of psoriasis.  May be developing psoriatic arthritis.

## 2022-06-29 NOTE — Patient Instructions (Signed)
Joint Pain  Joint pain can be caused by many things. It is likely to go away if you follow instructions from your doctor for taking care of yourself at home. Sometimes, you may need more treatment. Follow these instructions at home: Managing pain, stiffness, and swelling     If told, put ice on the painful area. To do this: If you have a removable elastic bandage, sling, or splint, take it off as told by your doctor. Put ice in a plastic bag. Place a towel between your skin and the bag. Leave the ice on for 20 minutes, 2-3 times a day. Take off the ice if your skin turns bright red. This is very important. If you cannot feel pain, heat, or cold, you have a greater risk of damage to the area. Move your fingers or toes below the painful joint often. Raise the painful joint above the level of your heart while you are sitting or lying down. If told, put heat on the painful area. Do this as often as told by your doctor. Use the heat source that your doctor recommends, such as a moist heat pack or a heating pad. Place a towel between your skin and the heat source. Leave the heat on for 20-30 minutes. Take off the heat if your skin gets bright red. This is especially important if you are unable to feel pain, heat, or cold. You may have a greater risk of getting burned. Activity Rest the painful joint for as long as told by your doctor. Do not do things that cause pain or make your pain worse. Begin exercising or stretching the affected area, as told by your doctor. Ask your doctor what types of exercise are safe for you. Return to your normal activities when your doctor says that it is safe. If you have an elastic bandage, sling, or splint: Wear it as told by your doctor. Take it only as told by your doctor. Loosen it your fingers or toes below the joint: Tingle. Become numb. Get cold and blue. Keep it clean. Ask your doctor if you should take it off before bathing. If it is not  waterproof: Do not let it get wet. Cover it with a watertight covering when you take a bath or shower. General instructions Take over-the-counter and prescription medicines only as told by your doctor. This may include medicines taken by mouth or applied to the skin. Do not smoke or use any products that contain nicotine or tobacco. If you need help quitting, ask your doctor. Keep all follow-up visits as told by your doctor. This is important. Contact a doctor if: You have pain that gets worse and does not get better with medicine. Your joint pain does not get better in 3 days. You have more bruising or swelling. You have a fever. You lose 10 lb (4.5 kg) or more without trying. Get help right away if: You cannot move the joint. Your fingers or toes tingle, become numb. or get cold and blue. You have a fever along with a joint that is red, warm, and swollen. Summary Joint pain can be caused by many things. It often goes away if you follow instructions from your doctor for taking care of yourself at home. Rest the painful joint for as long as told. Do not do things that cause pain or make your pain worse. Take over-the-counter and prescription medicines only as told by your doctor. This information is not intended to replace advice given to you   by your health care provider. Make sure you discuss any questions you have with your health care provider. Document Revised: 01/02/2020 Document Reviewed: 01/02/2020 Elsevier Patient Education  2023 Elsevier Inc.  

## 2022-06-30 LAB — TSH: TSH: 4.28 u[IU]/mL (ref 0.35–5.50)

## 2022-06-30 LAB — FERRITIN: Ferritin: 14.4 ng/mL (ref 10.0–291.0)

## 2022-07-03 ENCOUNTER — Telehealth: Payer: 59 | Admitting: Nurse Practitioner

## 2022-07-03 DIAGNOSIS — B9689 Other specified bacterial agents as the cause of diseases classified elsewhere: Secondary | ICD-10-CM

## 2022-07-03 DIAGNOSIS — J019 Acute sinusitis, unspecified: Secondary | ICD-10-CM | POA: Diagnosis not present

## 2022-07-03 LAB — ANA,IFA RA DIAG PNL W/RFLX TIT/PATN
Anti Nuclear Antibody (ANA): POSITIVE — AB
Cyclic Citrullin Peptide Ab: <16 UNITS
Rheumatoid fact SerPl-aCnc: <14 IU/mL (ref <14)

## 2022-07-03 LAB — ANTI-NUCLEAR AB-TITER (ANA TITER): ANA Titer 1: 1:1280 {titer} — ABNORMAL HIGH

## 2022-07-03 MED ORDER — AMOXICILLIN-POT CLAVULANATE 875-125 MG PO TABS: 1.0000 | ORAL_TABLET | Freq: Two times a day (BID) | ORAL | 0 refills | Status: AC

## 2022-07-03 NOTE — Progress Notes (Signed)

## 2022-07-03 NOTE — Progress Notes (Signed)
I have spent 5 minutes in review of e-visit questionnaire, review and updating patient chart, medical decision making and response to patient.  ° °Akeel Reffner W Morgen Ritacco, NP ° °  °

## 2022-07-05 ENCOUNTER — Other Ambulatory Visit: Payer: Self-pay | Admitting: Emergency Medicine

## 2022-07-05 DIAGNOSIS — R768 Other specified abnormal immunological findings in serum: Secondary | ICD-10-CM

## 2022-07-05 DIAGNOSIS — M2550 Pain in unspecified joint: Secondary | ICD-10-CM

## 2022-07-08 ENCOUNTER — Telehealth: Payer: Self-pay

## 2022-07-08 DIAGNOSIS — R768 Other specified abnormal immunological findings in serum: Secondary | ICD-10-CM

## 2022-07-08 NOTE — Telephone Encounter (Addendum)
Called patient to informed her that new referral was sent to [facility]

## 2022-07-08 NOTE — Telephone Encounter (Signed)
Pt is calling after receiving the call from Dr. Estanislado Pandy office they could see her until April and was wondering if Dr. Mitchel Honour would recommend someone else.  Please advise

## 2022-07-09 IMAGING — US US PELVIS COMPLETE WITH TRANSVAGINAL
1 series · 13 of 25 positions shown · non-contrast
Comparison: 02/02/2021

CLINICAL DATA: Recent pelvic pain and pressure, abnormal vaginal
bleeding. Remote left oophorectomy

EXAM:
TRANSABDOMINAL AND TRANSVAGINAL ULTRASOUND OF PELVIS
TECHNIQUE: Both transabdominal and transvaginal ultrasound examinations of the
pelvis were performed. Transabdominal technique was performed for
global imaging of the pelvis including uterus, ovaries, adnexal
regions, and pelvic cul-de-sac. It was necessary to proceed with
endovaginal exam following the transabdominal exam to visualize the
uterus and adnexae in better detail.

[Series 1: us pelvis complete with transvaginal · 0.26mm/px · 13 of 75 slices shown]
[im 1/75]
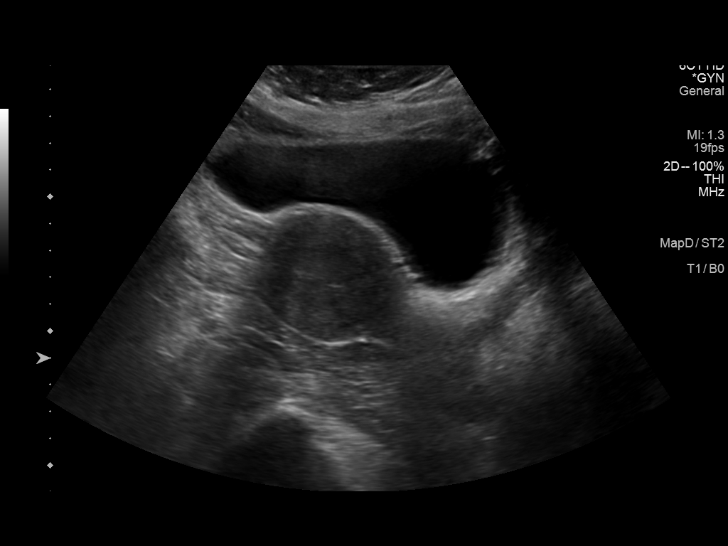
[im 7/75]
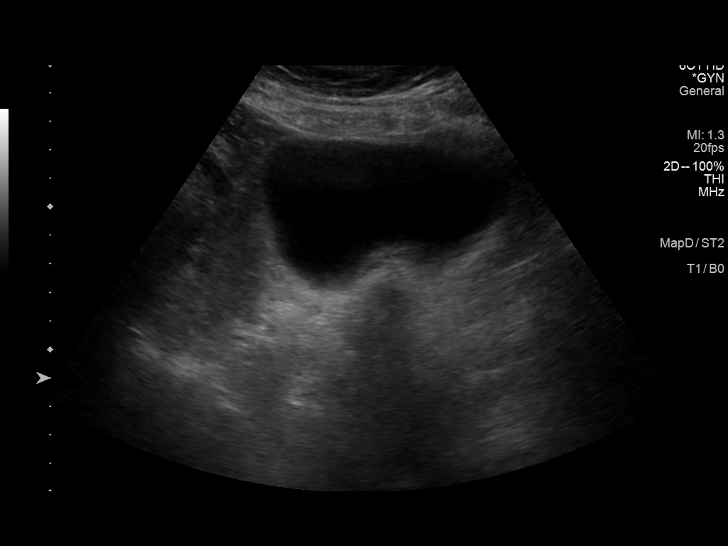
[im 13/75]
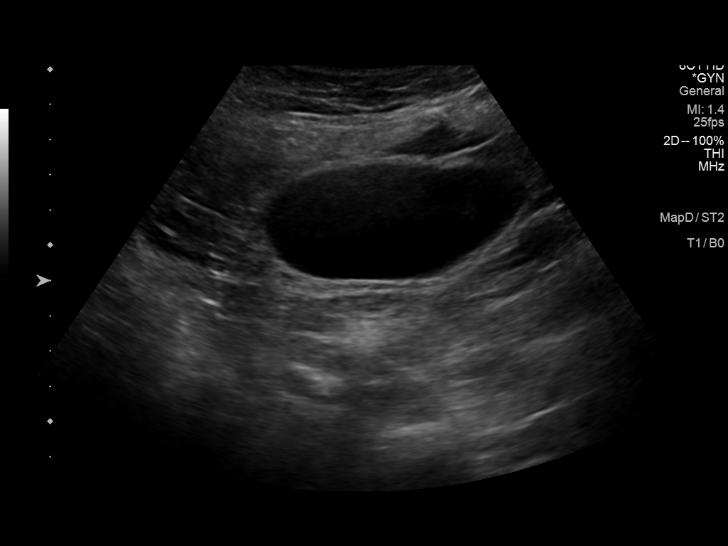
[im 19/75]
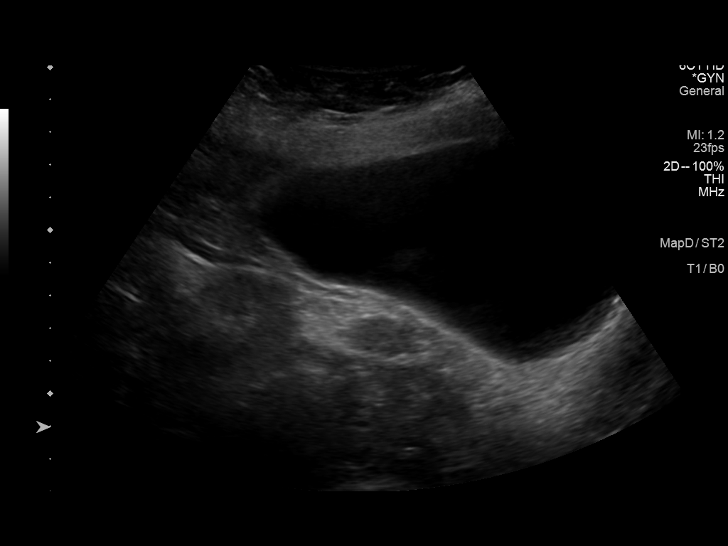
[im 25/75]
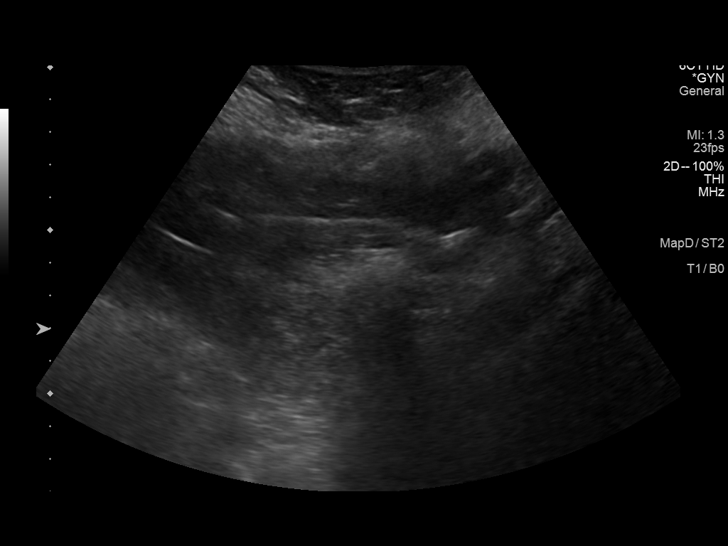
[im 31/75]
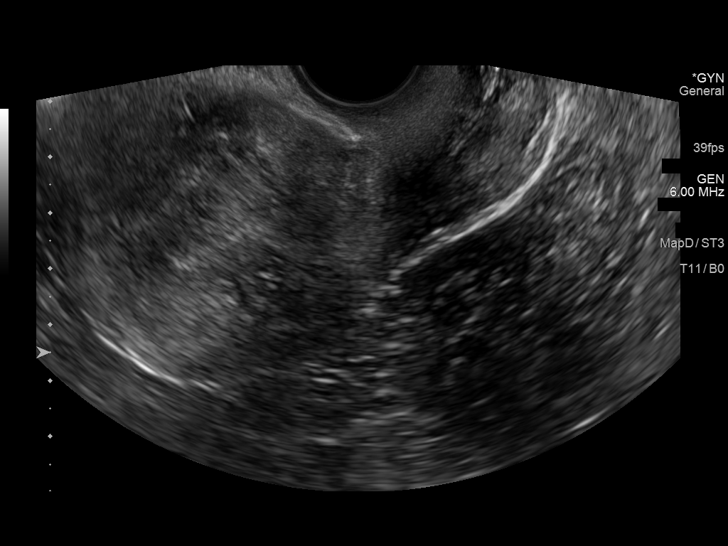
[im 38/75]
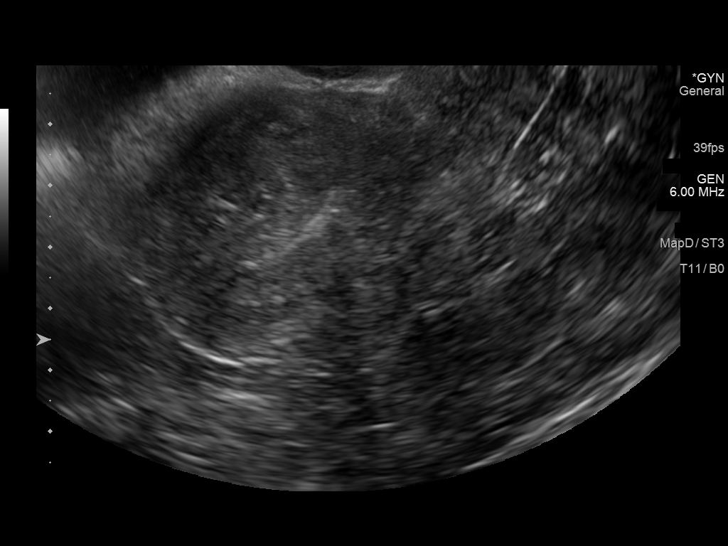
[im 44/75]
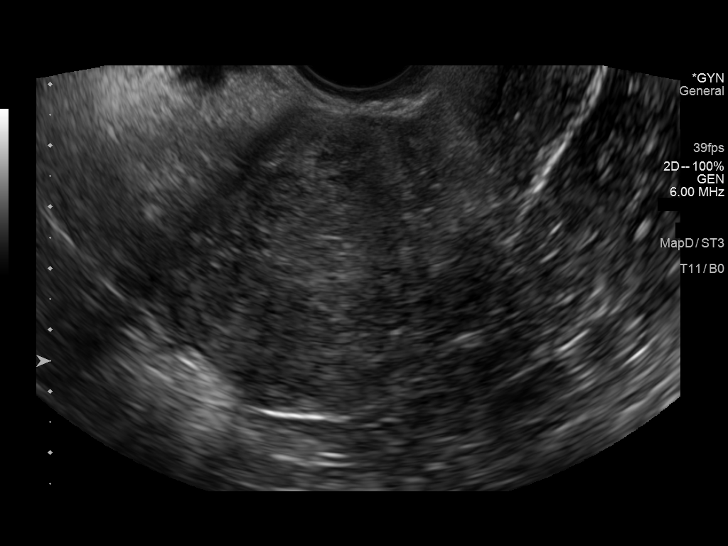
[im 50/75]
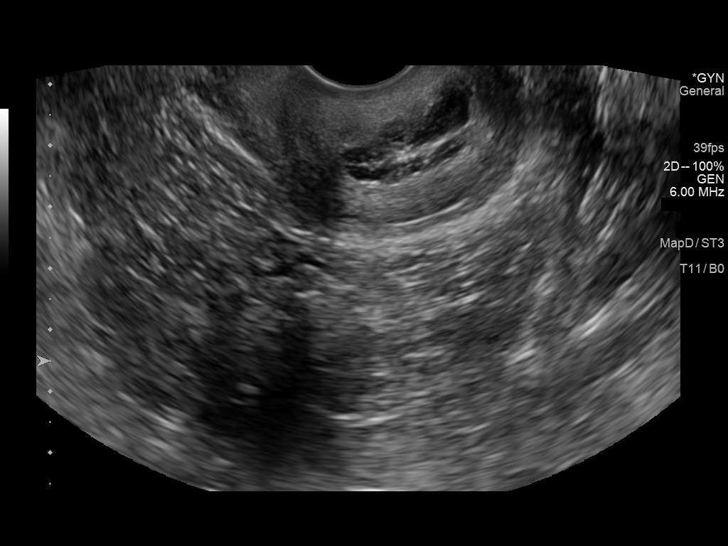
[im 56/75]
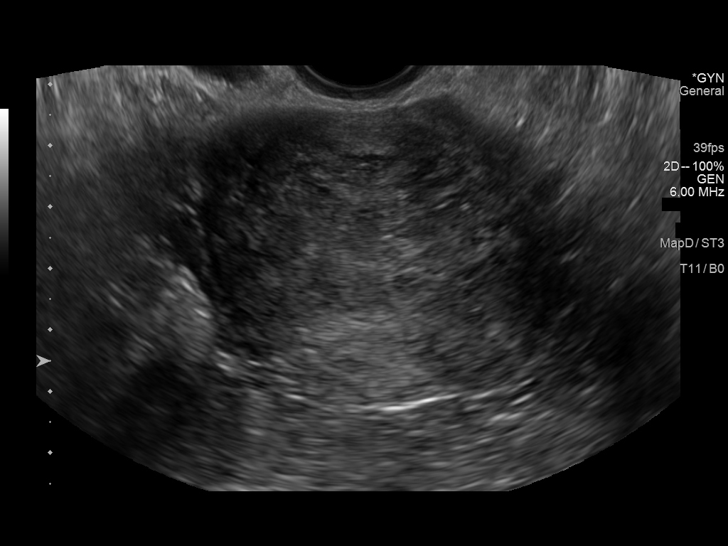
[im 62/75]
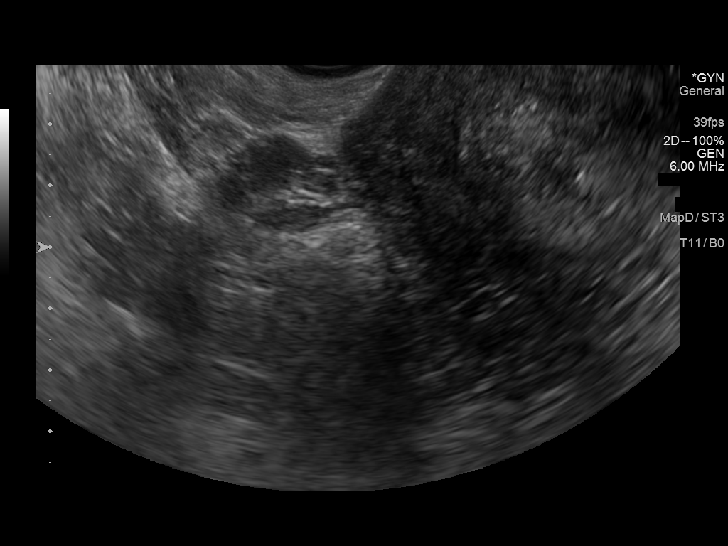
[im 68/75]
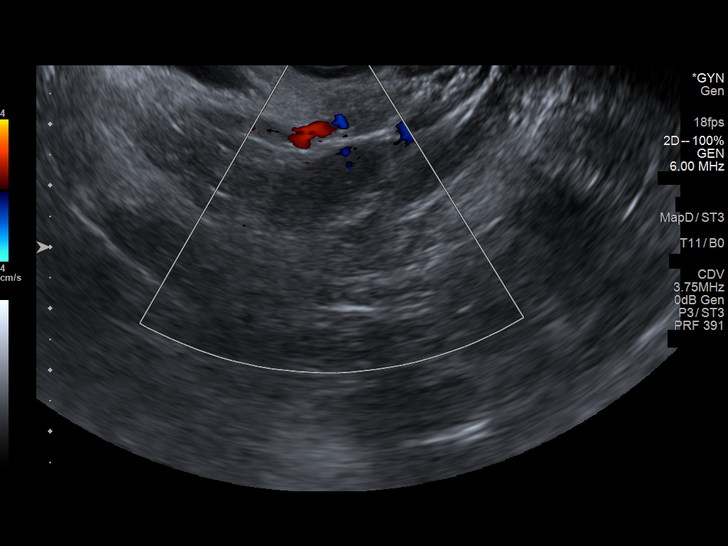
[im 75/75]
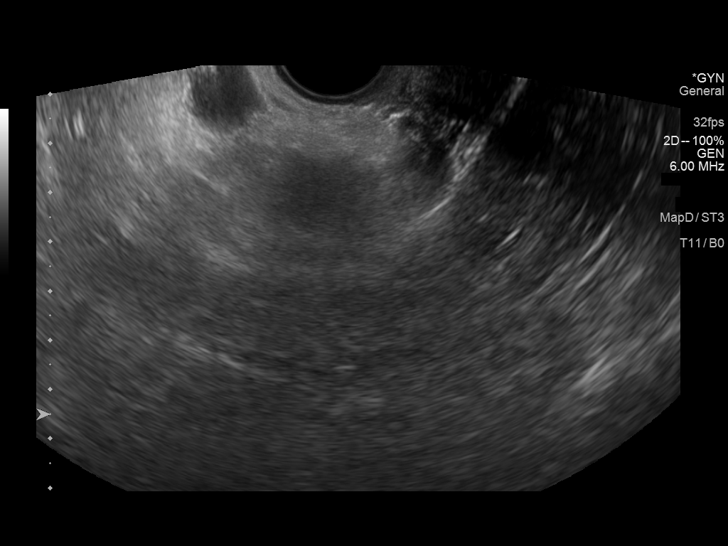

[13 of 25 positions shown; findings below may reference images not displayed]

FINDINGS: Uterus

Measurements: 9.5 x 4.7 x 6.5 cm = volume: 149 mL. Overall
heterogeneous myometrium throughout the uterine fundus and body with
some degree of ill-defined nodularity, again suggesting poorly
defined fibroids versus adenomyosis.

Endometrium

Thickness: 7.1 mm.  No focal abnormality visualized.

Right ovary

Measurements: 2.1 x 0.9 x 1.8 cm = volume: 1.7 mL. Normal
appearance/no adnexal mass.

Left ovary

Not visualized compatible with previous surgical removal. No left
adnexal abnormality.

Other findings

No abnormal free fluid.
IMPRESSION: Heterogeneous myometrium by ultrasound, again suggesting poorly
defined fibroids versus adenomyosis. This could be differentiated by
pelvic MRI.

7.1 mm endometrial thickness. In the setting of post-menopausal
bleeding, endometrial sampling is indicated to exclude carcinoma. If
results are benign, sonohysterogram should be considered for focal
lesion work-up. (Ref: Radiological Reasoning: Algorithmic Workup of
Abnormal Vaginal Bleeding with Endovaginal Sonography and
Sonohysterography. AJR [id number]; 191:[id number])

Remote left oophorectomy.

No free fluid.

## 2022-07-12 NOTE — Telephone Encounter (Signed)
Thanks

## 2022-07-29 ENCOUNTER — Ambulatory Visit: Payer: 59 | Admitting: Endocrinology

## 2022-10-09 ENCOUNTER — Telehealth: Payer: 59 | Admitting: Nurse Practitioner

## 2022-10-09 DIAGNOSIS — B9689 Other specified bacterial agents as the cause of diseases classified elsewhere: Secondary | ICD-10-CM | POA: Diagnosis not present

## 2022-10-09 DIAGNOSIS — J329 Chronic sinusitis, unspecified: Secondary | ICD-10-CM

## 2022-10-09 MED ORDER — AMOXICILLIN-POT CLAVULANATE 875-125 MG PO TABS: 1.0000 | ORAL_TABLET | Freq: Two times a day (BID) | ORAL | 0 refills | Status: AC

## 2022-10-09 NOTE — Progress Notes (Signed)
Augmentin which is amoxicillin/clavunate potassium (combined abx)  is superior to zpak. Zpaks are not usually given for sinus symptoms as they are less effective. You should also not have antibiotics left over as this tends to create abx resistance. Even if you are feeling better you should always complete the entire prescribed medication just fyi for future prescriptions. At this time we would treat with Augmentin for sinusitis symptoms.   E-Visit for Sinus Problems  We are sorry that you are not feeling well.  Here is how we plan to help!  Based on what you have shared with me it looks like you have sinusitis.  Sinusitis is inflammation and infection in the sinus cavities of the head.  Based on your presentation I believe you most likely have Acute Bacterial Sinusitis.  This is an infection caused by bacteria and is treated with antibiotics. I have prescribed Augmentin 875mg /125mg  one tablet twice daily with food, for 7 days. You may use an oral decongestant such as Mucinex D or if you have glaucoma or high blood pressure use plain Mucinex. Saline nasal spray help and can safely be used as often as needed for congestion.  If you develop worsening sinus pain, fever or notice severe headache and vision changes, or if symptoms are not better after completion of antibiotic, please schedule an appointment with a health care provider.    Sinus infections are not as easily transmitted as other respiratory infection, however we still recommend that you avoid close contact with loved ones, especially the very young and elderly.  Remember to wash your hands thoroughly throughout the day as this is the number one way to prevent the spread of infection!  Home Care: Only take medications as instructed by your medical team. Complete the entire course of an antibiotic. Do not take these medications with alcohol. A steam or ultrasonic humidifier can help congestion.  You can place a towel over your head and breathe  in the steam from hot water coming from a faucet. Avoid close contacts especially the very young and the elderly. Cover your mouth when you cough or sneeze. Always remember to wash your hands.  Get Help Right Away If: You develop worsening fever or sinus pain. You develop a severe head ache or visual changes. Your symptoms persist after you have completed your treatment plan.  Make sure you Understand these instructions. Will watch your condition. Will get help right away if you are not doing well or get worse.  Thank you for choosing an e-visit.  Your e-visit answers were reviewed by a board certified advanced clinical practitioner to complete your personal care plan. Depending upon the condition, your plan could have included both over the counter or prescription medications.  Please review your pharmacy choice. Make sure the pharmacy is open so you can pick up prescription now. If there is a problem, you may contact your provider through [employer] and have the prescription routed to another pharmacy.  Your safety is important to [place]. If you have drug allergies check your prescription carefully.   For the next 24 hours you can use MyChart to ask questions about today's visit, request a non-urgent call back, or ask for a work or school excuse. You will get an email in the next two days asking about your experience. I hope that your e-visit has been valuable and will speed your recovery.

## 2022-10-09 NOTE — Progress Notes (Signed)
I have spent 5 minutes in review of e-visit questionnaire, review and updating patient chart, medical decision making and response to patient.  ° °Taelyr Jantz W Saphire Barnhart, NP ° °  °

## 2022-12-12 ENCOUNTER — Telehealth: Payer: 59 | Admitting: Family

## 2022-12-12 DIAGNOSIS — J0100 Acute maxillary sinusitis, unspecified: Secondary | ICD-10-CM | POA: Diagnosis not present

## 2022-12-12 MED ORDER — AMOXICILLIN-POT CLAVULANATE 875-125 MG PO TABS: 1.0000 | ORAL_TABLET | Freq: Two times a day (BID) | ORAL | 0 refills | Status: DC

## 2022-12-12 NOTE — Progress Notes (Signed)

## 2022-12-28 ENCOUNTER — Ambulatory Visit: Payer: 59 | Admitting: Emergency Medicine

## 2022-12-28 ENCOUNTER — Encounter: Payer: Self-pay | Admitting: Emergency Medicine

## 2022-12-28 VITALS — BP 126/80 | HR 60 | Temp 98.4°F | Ht 64.0 in | Wt 205.0 lb

## 2022-12-28 DIAGNOSIS — R519 Headache, unspecified: Secondary | ICD-10-CM | POA: Insufficient documentation

## 2022-12-28 DIAGNOSIS — Z6835 Body mass index (BMI) 35.0-35.9, adult: Secondary | ICD-10-CM | POA: Insufficient documentation

## 2022-12-28 DIAGNOSIS — R0981 Nasal congestion: Secondary | ICD-10-CM | POA: Insufficient documentation

## 2022-12-28 DIAGNOSIS — J0100 Acute maxillary sinusitis, unspecified: Secondary | ICD-10-CM | POA: Insufficient documentation

## 2022-12-28 DIAGNOSIS — K5901 Slow transit constipation: Secondary | ICD-10-CM | POA: Insufficient documentation

## 2022-12-28 DIAGNOSIS — E669 Obesity, unspecified: Secondary | ICD-10-CM | POA: Insufficient documentation

## 2022-12-28 DIAGNOSIS — J329 Chronic sinusitis, unspecified: Secondary | ICD-10-CM | POA: Insufficient documentation

## 2022-12-28 DIAGNOSIS — M1991 Primary osteoarthritis, unspecified site: Secondary | ICD-10-CM | POA: Insufficient documentation

## 2022-12-28 DIAGNOSIS — K5909 Other constipation: Secondary | ICD-10-CM | POA: Insufficient documentation

## 2022-12-28 MED ORDER — AMOXICILLIN-POT CLAVULANATE 875-125 MG PO TABS: 1.0000 | ORAL_TABLET | Freq: Two times a day (BID) | ORAL | 0 refills | Status: AC

## 2022-12-28 NOTE — Assessment & Plan Note (Signed)
Partially treated Recommend to restart Augmentin 875 mg twice daily for 14 days

## 2022-12-28 NOTE — Assessment & Plan Note (Signed)
Pain management discussed Recommend to take Advil dual action every 6-8 hours as needed

## 2022-12-28 NOTE — Patient Instructions (Signed)
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
## 2022-12-28 NOTE — Assessment & Plan Note (Signed)
Continue Mucinex D twice a day Continue Flonase twice a day Use saline nasal spray frequently during the day

## 2023-01-17 ENCOUNTER — Encounter: Payer: 59 | Admitting: Rheumatology

## 2023-02-08 ENCOUNTER — Ambulatory Visit: Payer: 59 | Admitting: Rheumatology

## 2023-02-17 ENCOUNTER — Telehealth: Payer: 59 | Admitting: Physician Assistant

## 2023-02-17 DIAGNOSIS — J069 Acute upper respiratory infection, unspecified: Secondary | ICD-10-CM | POA: Diagnosis not present

## 2023-02-17 MED ORDER — BENZONATATE 100 MG PO CAPS: 100.0000 mg | ORAL_CAPSULE | Freq: Three times a day (TID) | ORAL | 0 refills | Status: DC | PRN

## 2023-02-17 MED ORDER — FLUTICASONE PROPIONATE 50 MCG/ACT NA SUSP: 2.0000 | Freq: Every day | NASAL | 0 refills | Status: AC

## 2023-02-17 NOTE — Progress Notes (Signed)
E-Visit for Upper Respiratory Infection   We are sorry you are not feeling well.  Here is how we plan to help!  Based on what you have shared with me, it looks like you may have a viral upper respiratory infection.  Upper respiratory infections are caused by a large number of viruses; however, rhinovirus is the most common cause.   Symptoms vary from person to person, with common symptoms including sore throat, cough, fatigue or lack of energy and feeling of general discomfort.  A low-grade fever of up to 100.4 may present, but is often uncommon.  Symptoms vary however, and are closely related to a person's age or underlying illnesses.  The most common symptoms associated with an upper respiratory infection are nasal discharge or congestion, cough, sneezing, headache and pressure in the ears and face.  These symptoms usually persist for about 3 to 10 days, but can last up to 2 weeks.  It is important to know that upper respiratory infections do not cause serious illness or complications in most cases.    Upper respiratory infections can be transmitted from person to person, with the most common method of transmission being a person's hands.  The virus is able to live on the skin and can infect other persons for up to 2 hours after direct contact.  Also, these can be transmitted when someone coughs or sneezes; thus, it is important to cover the mouth to reduce this risk.  To keep the spread of the illness at bay, good hand hygiene is very important.  This is an infection that is most likely caused by a virus. There are no specific treatments other than to help you with the symptoms until the infection runs its course.  We are sorry you are not feeling well.  Here is how we plan to help!  Continue Clindamycin for dental infection. Clindamycin will cover bacterial upper respiratory infections if it is bacterial. The fact that it has not makes me believe it may be more viral.   For nasal congestion, you  may use an oral decongestants such as Mucinex D or if you have glaucoma or high blood pressure use plain Mucinex.  Saline nasal spray or nasal drops can help and can safely be used as often as needed for congestion.  For your congestion, I have prescribed Fluticasone nasal spray one spray in each nostril twice a day  If you do not have a history of heart disease, hypertension, diabetes or thyroid disease, prostate/bladder issues or glaucoma, you may also use Sudafed to treat nasal congestion.  It is highly recommended that you consult with a pharmacist or your primary care physician to ensure this medication is safe for you to take.     If you have a cough, you may use cough suppressants such as Delsym and Robitussin.  If you have glaucoma or high blood pressure, you can also use Coricidin HBP.   For cough I have prescribed for you A prescription cough medication called Tessalon Perles 100 mg. You may take 1-2 capsules every 8 hours as needed for cough  If you have a sore or scratchy throat, use a saltwater gargle-  to  teaspoon of salt dissolved in a 4-ounce to 8-ounce glass of warm water.  Gargle the solution for approximately 15-30 seconds and then spit.  It is important not to swallow the solution.  You can also use throat lozenges/cough drops and Chloraseptic spray to help with throat pain or discomfort.  Warm  or cold liquids can also be helpful in relieving throat pain.  For headache, pain or general discomfort, you can use Ibuprofen or Tylenol as directed.   Some authorities believe that zinc sprays or the use of Echinacea may shorten the course of your symptoms.   HOME CARE Only take medications as instructed by your medical team. Be sure to drink plenty of fluids. Water is fine as well as fruit juices, sodas and electrolyte beverages. You may want to stay away from caffeine or alcohol. If you are nauseated, try taking small sips of liquids. How do you know if you are getting enough fluid?  Your urine should be a pale yellow or almost colorless. Get rest. Taking a steamy shower or using a humidifier may help nasal congestion and ease sore throat pain. You can place a towel over your head and breathe in the steam from hot water coming from a faucet. Using a saline nasal spray works much the same way. Cough drops, hard candies and sore throat lozenges may ease your cough. Avoid close contacts especially the very young and the elderly Cover your mouth if you cough or sneeze Always remember to wash your hands.   GET HELP RIGHT AWAY IF: You develop worsening fever. If your symptoms do not improve within 10 days You develop yellow or green discharge from your nose over 3 days. You have coughing fits You develop a severe head ache or visual changes. You develop shortness of breath, difficulty breathing or start having chest pain Your symptoms persist after you have completed your treatment plan  MAKE SURE YOU  Understand these instructions. Will watch your condition. Will get help right away if you are not doing well or get worse.  Thank you for choosing an e-visit.  Your e-visit answers were reviewed by a board certified advanced clinical practitioner to complete your personal care plan. Depending upon the condition, your plan could have included both over the counter or prescription medications.  Please review your pharmacy choice. Make sure the pharmacy is open so you can pick up prescription now. If there is a problem, you may contact your provider through [employer] and have the prescription routed to another pharmacy.  Your safety is important to [place]. If you have drug allergies check your prescription carefully.   For the next 24 hours you can use MyChart to ask questions about today's visit, request a non-urgent call back, or ask for a work or school excuse. You will get an email in the next two days asking about your experience. I hope that your e-visit has been  valuable and will speed your recovery.  I have spent 5 minutes in review of e-visit questionnaire, review and updating patient chart, medical decision making and response to patient.   Margaretann Loveless, PA-C

## 2023-02-21 ENCOUNTER — Telehealth: Payer: 59 | Admitting: Nurse Practitioner

## 2023-02-21 DIAGNOSIS — B9789 Other viral agents as the cause of diseases classified elsewhere: Secondary | ICD-10-CM

## 2023-02-21 NOTE — Progress Notes (Signed)
E-Visit for Sinus Problems  We are sorry that you are not feeling well.  Here is how we plan to help!  Based on what you have shared with me it looks like you have sinusitis.  Sinusitis is inflammation and infection in the sinus cavities of the head.  Based on your presentation I believe you most likely have Acute Viral Sinusitis.This is an infection most likely caused by a virus. There is not specific treatment for viral sinusitis other than to help you with the symptoms until the infection runs its course.  You may use an oral decongestant such as Mucinex D or if you have glaucoma or high blood pressure use plain Mucinex. Saline nasal spray help and can safely be used as often as needed for congestion, It is important you continue the Fluticasone nasal spray two sprays in each nostril once a day that was prescribed at the previous visit.   The antibiotic that you are on would cover a bacterial infection in your sinuses, so you are likely still dealing with viral symptoms that need decongestant and nasal spray intervention. You may also benefit from saline rinses, and ibuprofen for inflammation relief.    Some authorities believe that zinc sprays or the use of Echinacea may shorten the course of your symptoms.  Sinus infections are not as easily transmitted as other respiratory infection, however we still recommend that you avoid close contact with loved ones, especially the very young and elderly.  Remember to wash your hands thoroughly throughout the day as this is the number one way to prevent the spread of infection!  Home Care: Only take medications as instructed by your medical team. Do not take these medications with alcohol. A steam or ultrasonic humidifier can help congestion.  You can place a towel over your head and breathe in the steam from hot water coming from a faucet. Avoid close contacts especially the very young and the elderly. Cover your mouth when you cough or sneeze. Always  remember to wash your hands.  Get Help Right Away If: You develop worsening fever or sinus pain. You develop a severe head ache or visual changes. Your symptoms persist after you have completed your treatment plan.  Make sure you Understand these instructions. Will watch your condition. Will get help right away if you are not doing well or get worse.   Thank you for choosing an e-visit.  Your e-visit answers were reviewed by a board certified advanced clinical practitioner to complete your personal care plan. Depending upon the condition, your plan could have included both over the counter or prescription medications.  Please review your pharmacy choice. Make sure the pharmacy is open so you can pick up prescription now. If there is a problem, you may contact your provider through [employer] and have the prescription routed to another pharmacy.  Your safety is important to [place]. If you have drug allergies check your prescription carefully.   For the next 24 hours you can use MyChart to ask questions about today's visit, request a non-urgent call back, or ask for a work or school excuse. You will get an email in the next two days asking about your experience. I hope that your e-visit has been valuable and will speed your recovery.    I spent approximately 5 minutes reviewing the patient's history, current symptoms and coordinating their care today.

## 2023-03-16 ENCOUNTER — Encounter: Payer: Self-pay | Admitting: Emergency Medicine

## 2023-03-16 ENCOUNTER — Ambulatory Visit: Payer: 59 | Admitting: Emergency Medicine

## 2023-03-16 VITALS — BP 128/74 | HR 65 | Temp 97.8°F | Ht 64.0 in | Wt 201.5 lb

## 2023-03-16 DIAGNOSIS — E039 Hypothyroidism, unspecified: Secondary | ICD-10-CM | POA: Diagnosis not present

## 2023-03-16 DIAGNOSIS — Z23 Encounter for immunization: Secondary | ICD-10-CM

## 2023-03-16 DIAGNOSIS — Z6834 Body mass index (BMI) 34.0-34.9, adult: Secondary | ICD-10-CM | POA: Diagnosis not present

## 2023-03-16 DIAGNOSIS — E6609 Other obesity due to excess calories: Secondary | ICD-10-CM

## 2023-03-16 DIAGNOSIS — E042 Nontoxic multinodular goiter: Secondary | ICD-10-CM | POA: Diagnosis not present

## 2023-03-16 DIAGNOSIS — M2550 Pain in unspecified joint: Secondary | ICD-10-CM

## 2023-03-16 LAB — COMPREHENSIVE METABOLIC PANEL WITH GFR
ALT: 33 U/L (ref 0–35)
AST: 24 U/L (ref 0–37)
Albumin: 4.2 g/dL (ref 3.5–5.2)
Alkaline Phosphatase: 52 U/L (ref 39–117)
BUN: 15 mg/dL (ref 6–23)
CO2: 28 meq/L (ref 19–32)
Calcium: 9.6 mg/dL (ref 8.4–10.5)
Chloride: 101 meq/L (ref 96–112)
Creatinine, Ser: 0.87 mg/dL (ref 0.40–1.20)
GFR: 76.49 mL/min
Glucose, Bld: 94 mg/dL (ref 70–99)
Potassium: 3.9 meq/L (ref 3.5–5.1)
Sodium: 137 meq/L (ref 135–145)
Total Bilirubin: 0.3 mg/dL (ref 0.2–1.2)
Total Protein: 7.3 g/dL (ref 6.0–8.3)

## 2023-03-16 LAB — CBC WITH DIFFERENTIAL/PLATELET
Basophils Absolute: 0.1 10*3/uL (ref 0.0–0.1)
Basophils Relative: 1.8 % (ref 0.0–3.0)
Eosinophils Absolute: 0.2 10*3/uL (ref 0.0–0.7)
Eosinophils Relative: 3.5 % (ref 0.0–5.0)
HCT: 38.9 % (ref 36.0–46.0)
Hemoglobin: 12.9 g/dL (ref 12.0–15.0)
Lymphocytes Relative: 32.6 % (ref 12.0–46.0)
Lymphs Abs: 1.9 10*3/uL (ref 0.7–4.0)
MCHC: 33.1 g/dL (ref 30.0–36.0)
MCV: 90.2 fl (ref 78.0–100.0)
Monocytes Absolute: 0.9 10*3/uL (ref 0.1–1.0)
Monocytes Relative: 14.8 % — ABNORMAL HIGH (ref 3.0–12.0)
Neutro Abs: 2.7 10*3/uL (ref 1.4–7.7)
Neutrophils Relative %: 47.3 % (ref 43.0–77.0)
Platelets: 293 10*3/uL (ref 150.0–400.0)
RBC: 4.31 Mil/uL (ref 3.87–5.11)
RDW: 13.8 % (ref 11.5–15.5)
WBC: 5.8 10*3/uL (ref 4.0–10.5)

## 2023-03-16 LAB — LIPID PANEL
Cholesterol: 228 mg/dL — ABNORMAL HIGH (ref 0–200)
HDL: 66.9 mg/dL (ref >39.00)
LDL Cholesterol: 128 mg/dL — ABNORMAL HIGH (ref 0–99)
NonHDL: 161.27
Total CHOL/HDL Ratio: 3
Triglycerides: 166 mg/dL — ABNORMAL HIGH (ref 0.0–149.0)
VLDL: 33.2 mg/dL (ref 0.0–40.0)

## 2023-03-16 LAB — HEMOGLOBIN A1C: Hgb A1c MFr Bld: 5.1 % (ref 4.6–6.5)

## 2023-03-16 LAB — TSH: TSH: 2.52 u[IU]/mL (ref 0.35–5.50)
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
## 2023-03-16 NOTE — Assessment & Plan Note (Signed)
Stable.  Nontender thyroid.

## 2023-03-16 NOTE — Patient Instructions (Signed)

## 2023-03-16 NOTE — Assessment & Plan Note (Signed)
Diet and nutrition discussed Benefits of exercise discussed Advised to decrease amount of daily carbohydrate intake and daily calories and increase amount of plant based protein in her diet. 

## 2023-03-16 NOTE — Assessment & Plan Note (Signed)
Clinically euthyroid.  TSH done today. Continue levothyroxine 75 mcg daily 

## 2023-03-16 NOTE — Assessment & Plan Note (Signed)
Stable and well-controlled.  No concerns. 

## 2023-03-18 ENCOUNTER — Other Ambulatory Visit: Payer: Self-pay | Admitting: *Deleted

## 2023-03-18 ENCOUNTER — Telehealth: Payer: Self-pay | Admitting: Emergency Medicine

## 2023-03-18 MED ORDER — LEVOTHYROXINE SODIUM 75 MCG PO TABS: 75.0000 ug | ORAL_TABLET | Freq: Every day | ORAL | 3 refills | Status: DC

## 2023-03-18 NOTE — Telephone Encounter (Signed)
New prescription sent to patient requested pharmacy  

## 2023-03-18 NOTE — Telephone Encounter (Signed)
Prescription Request  03/18/2023  LOV: 03/16/2023  What is the name of the medication or equipment? levothyroxine  Have you contacted your pharmacy to request a refill? Yes   Which pharmacy would you like this sent to?  [id number] - [place], [address] - [address] AT [facility] OF [address] [place] [address] Phone: [phone] Fax: [phone]    Patient notified that their request is being sent to the clinical staff for review and that they should receive a response within 2 business days.   Please advise at [facility] [phone]

## 2023-09-15 ENCOUNTER — Ambulatory Visit: Payer: 59 | Admitting: Emergency Medicine

## 2023-09-15 ENCOUNTER — Encounter: Payer: Self-pay | Admitting: Emergency Medicine

## 2023-09-15 VITALS — BP 124/86 | HR 67 | Temp 98.3°F | Ht 64.0 in | Wt 194.6 lb

## 2023-09-15 DIAGNOSIS — E042 Nontoxic multinodular goiter: Secondary | ICD-10-CM | POA: Diagnosis not present

## 2023-09-15 DIAGNOSIS — E039 Hypothyroidism, unspecified: Secondary | ICD-10-CM | POA: Diagnosis not present

## 2023-09-15 DIAGNOSIS — M150 Primary generalized (osteo)arthritis: Secondary | ICD-10-CM | POA: Diagnosis not present

## 2023-09-15 DIAGNOSIS — E6609 Other obesity due to excess calories: Secondary | ICD-10-CM

## 2023-09-15 DIAGNOSIS — Z6834 Body mass index (BMI) 34.0-34.9, adult: Secondary | ICD-10-CM

## 2023-09-15 DIAGNOSIS — E66811 Obesity, class 1: Secondary | ICD-10-CM

## 2023-09-15 NOTE — Assessment & Plan Note (Signed)
Stable.  Nontender thyroid.

## 2023-09-15 NOTE — Patient Instructions (Signed)

## 2023-09-15 NOTE — Assessment & Plan Note (Signed)
Stable and asymptomatic  
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
## 2023-09-15 NOTE — Assessment & Plan Note (Signed)
Clinically euthyroid. Continue levothyroxine 75 mcg daily Lab Results  Component Value Date   TSH 2.52 03/16/2023

## 2023-09-15 NOTE — Assessment & Plan Note (Signed)
Eating better and losing weight.   Diet and nutrition discussed Benefits of exercise discussed Advised to decrease amount of daily carbohydrate intake and daily calories and increase amount of plant-based protein in her diet

## 2024-02-13 DIAGNOSIS — Z01419 Encounter for gynecological examination (general) (routine) without abnormal findings: Secondary | ICD-10-CM | POA: Diagnosis not present

## 2024-02-13 DIAGNOSIS — Z1231 Encounter for screening mammogram for malignant neoplasm of breast: Secondary | ICD-10-CM | POA: Diagnosis not present

## 2024-02-13 LAB — HM MAMMOGRAPHY

## 2024-02-16 ENCOUNTER — Other Ambulatory Visit: Payer: Self-pay | Admitting: Obstetrics & Gynecology

## 2024-02-16 DIAGNOSIS — R928 Other abnormal and inconclusive findings on diagnostic imaging of breast: Secondary | ICD-10-CM

## 2024-03-05 ENCOUNTER — Ambulatory Visit
Admission: RE | Admit: 2024-03-05 | Discharge: 2024-03-05 | Disposition: A | Discharge status: Home / Self Care | Source: Ambulatory Visit | Attending: Obstetrics & Gynecology | Admitting: Obstetrics & Gynecology

## 2024-03-05 DIAGNOSIS — N6325 Unspecified lump in the left breast, overlapping quadrants: Secondary | ICD-10-CM | POA: Diagnosis not present

## 2024-03-05 DIAGNOSIS — R928 Other abnormal and inconclusive findings on diagnostic imaging of breast: Secondary | ICD-10-CM

## 2024-03-06 ENCOUNTER — Other Ambulatory Visit: Payer: Self-pay | Admitting: Obstetrics & Gynecology

## 2024-03-06 DIAGNOSIS — N6320 Unspecified lump in the left breast, unspecified quadrant: Secondary | ICD-10-CM

## 2024-03-07 DIAGNOSIS — Z20822 Contact with and (suspected) exposure to covid-19: Secondary | ICD-10-CM | POA: Diagnosis not present

## 2024-03-07 DIAGNOSIS — J069 Acute upper respiratory infection, unspecified: Secondary | ICD-10-CM | POA: Diagnosis not present

## 2024-03-08 ENCOUNTER — Other Ambulatory Visit: Payer: Self-pay | Admitting: Emergency Medicine

## 2024-03-14 ENCOUNTER — Ambulatory Visit: Payer: 59 | Admitting: Emergency Medicine

## 2024-03-14 ENCOUNTER — Encounter: Payer: Self-pay | Admitting: Emergency Medicine

## 2024-03-14 VITALS — BP 120/78 | HR 72 | Temp 98.6°F | Ht 64.0 in | Wt 197.2 lb

## 2024-03-14 DIAGNOSIS — E6609 Other obesity due to excess calories: Secondary | ICD-10-CM

## 2024-03-14 DIAGNOSIS — M150 Primary generalized (osteo)arthritis: Secondary | ICD-10-CM

## 2024-03-14 DIAGNOSIS — E66811 Obesity, class 1: Secondary | ICD-10-CM | POA: Diagnosis not present

## 2024-03-14 DIAGNOSIS — R6889 Other general symptoms and signs: Secondary | ICD-10-CM | POA: Diagnosis not present

## 2024-03-14 DIAGNOSIS — Z6833 Body mass index (BMI) 33.0-33.9, adult: Secondary | ICD-10-CM

## 2024-03-14 DIAGNOSIS — E039 Hypothyroidism, unspecified: Secondary | ICD-10-CM | POA: Diagnosis not present

## 2024-03-14 DIAGNOSIS — J0100 Acute maxillary sinusitis, unspecified: Secondary | ICD-10-CM | POA: Diagnosis not present

## 2024-03-14 LAB — CBC WITH DIFFERENTIAL/PLATELET
Basophils Absolute: 0.1 10*3/uL (ref 0.0–0.1)
Basophils Relative: 1.4 % (ref 0.0–3.0)
Eosinophils Absolute: 0.2 10*3/uL (ref 0.0–0.7)
Eosinophils Relative: 3.2 % (ref 0.0–5.0)
HCT: 40.4 % (ref 36.0–46.0)
Hemoglobin: 13.6 g/dL (ref 12.0–15.0)
Lymphocytes Relative: 32.3 % (ref 12.0–46.0)
Lymphs Abs: 1.9 10*3/uL (ref 0.7–4.0)
MCHC: 33.8 g/dL (ref 30.0–36.0)
MCV: 88.1 fl (ref 78.0–100.0)
Monocytes Absolute: 0.8 10*3/uL (ref 0.1–1.0)
Monocytes Relative: 13.6 % — ABNORMAL HIGH (ref 3.0–12.0)
Neutro Abs: 3 10*3/uL (ref 1.4–7.7)
Neutrophils Relative %: 49.5 % (ref 43.0–77.0)
Platelets: 282 10*3/uL (ref 150.0–400.0)
RBC: 4.59 Mil/uL (ref 3.87–5.11)
RDW: 13.6 % (ref 11.5–15.5)
WBC: 6 10*3/uL (ref 4.0–10.5)

## 2024-03-14 LAB — COMPREHENSIVE METABOLIC PANEL WITH GFR
ALT: 22 U/L (ref 0–35)
AST: 21 U/L (ref 0–37)
Albumin: 4.5 g/dL (ref 3.5–5.2)
Alkaline Phosphatase: 57 U/L (ref 39–117)
BUN: 16 mg/dL (ref 6–23)
CO2: 31 meq/L (ref 19–32)
Calcium: 10.1 mg/dL (ref 8.4–10.5)
Chloride: 99 meq/L (ref 96–112)
Creatinine, Ser: 0.9 mg/dL (ref 0.40–1.20)
GFR: 72.92 mL/min (ref >60.00)
Glucose, Bld: 91 mg/dL (ref 70–99)
Potassium: 3.9 meq/L (ref 3.5–5.1)
Sodium: 137 meq/L (ref 135–145)
Total Bilirubin: 0.4 mg/dL (ref 0.2–1.2)
Total Protein: 7.5 g/dL (ref 6.0–8.3)

## 2024-03-14 LAB — LIPID PANEL
Cholesterol: 232 mg/dL — ABNORMAL HIGH (ref 0–200)
HDL: 65.8 mg/dL (ref >39.00)
LDL Cholesterol: 137 mg/dL — ABNORMAL HIGH (ref 0–99)
NonHDL: 166.5
Total CHOL/HDL Ratio: 4
Triglycerides: 146 mg/dL (ref 0.0–149.0)
VLDL: 29.2 mg/dL (ref 0.0–40.0)

## 2024-03-14 MED ORDER — AMOXICILLIN-POT CLAVULANATE 875-125 MG PO TABS: 1.0000 | ORAL_TABLET | Freq: Two times a day (BID) | ORAL | 0 refills | Status: AC

## 2024-03-14 MED ORDER — PSEUDOEPHEDRINE-GUAIFENESIN ER 60-600 MG PO TB12: 1.0000 | ORAL_TABLET | Freq: Two times a day (BID) | ORAL | 0 refills | Status: AC

## 2024-03-14 NOTE — Assessment & Plan Note (Signed)
 Upper viral respiratory infection now with secondary bacterial infection. Recommend to start Augmentin  875 mg twice a day for 7 days Clinically stable.  No red flag signs or symptoms. Symptom management discussed. Advised to rest and stay well-hydrated Advised to contact the office if no better or worse during the next several days.

## 2024-03-14 NOTE — Patient Instructions (Signed)

## 2024-03-14 NOTE — Assessment & Plan Note (Signed)
 Symptom management discussed Advised to rest and stay well-hydrated Recommend to start Mucinex  D every 12 hours Advised to contact the office if no better or worse during the next several days

## 2024-03-14 NOTE — Assessment & Plan Note (Signed)
 TSH done today. Clinically euthyroid. Continue levothyroxine  75 mcg daily
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
## 2024-03-14 NOTE — Assessment & Plan Note (Signed)
 Eating better and losing weight.   Diet and nutrition discussed Benefits of exercise discussed Advised to decrease amount of daily carbohydrate intake and daily calories and increase amount of plant-based protein in her diet

## 2024-03-14 NOTE — Assessment & Plan Note (Signed)
 Stable and asymptomatic

## 2024-03-15 LAB — HEMOGLOBIN A1C: Hgb A1c MFr Bld: 5.4 % (ref 4.6–6.5)

## 2024-03-15 LAB — TSH: TSH: 3.02 u[IU]/mL (ref 0.35–5.50)

## 2024-03-17 ENCOUNTER — Ambulatory Visit: Payer: Self-pay | Admitting: Emergency Medicine

## 2024-05-27 DIAGNOSIS — L03311 Cellulitis of abdominal wall: Secondary | ICD-10-CM | POA: Diagnosis not present

## 2024-06-01 DIAGNOSIS — L309 Dermatitis, unspecified: Secondary | ICD-10-CM | POA: Diagnosis not present

## 2024-09-05 ENCOUNTER — Ambulatory Visit
Admission: RE | Admit: 2024-09-05 | Discharge: 2024-09-05 | Disposition: A | Source: Ambulatory Visit | Attending: Obstetrics & Gynecology | Admitting: Obstetrics & Gynecology

## 2024-09-05 ENCOUNTER — Other Ambulatory Visit

## 2024-09-05 ENCOUNTER — Other Ambulatory Visit: Payer: Self-pay | Admitting: Obstetrics & Gynecology

## 2024-09-05 DIAGNOSIS — N6325 Unspecified lump in the left breast, overlapping quadrants: Secondary | ICD-10-CM | POA: Diagnosis not present

## 2024-09-05 DIAGNOSIS — N6320 Unspecified lump in the left breast, unspecified quadrant: Secondary | ICD-10-CM

## 2024-09-05 DIAGNOSIS — R928 Other abnormal and inconclusive findings on diagnostic imaging of breast: Secondary | ICD-10-CM

## 2024-09-13 ENCOUNTER — Ambulatory Visit: Admitting: Emergency Medicine

## 2024-10-02 ENCOUNTER — Encounter: Payer: Self-pay | Admitting: Emergency Medicine

## 2024-10-02 ENCOUNTER — Ambulatory Visit: Admitting: Emergency Medicine

## 2024-10-02 VITALS — BP 140/80 | HR 62 | Temp 98.0°F | Ht 64.0 in | Wt 195.0 lb

## 2024-10-02 DIAGNOSIS — E039 Hypothyroidism, unspecified: Secondary | ICD-10-CM | POA: Diagnosis not present

## 2024-10-02 DIAGNOSIS — M150 Primary generalized (osteo)arthritis: Secondary | ICD-10-CM | POA: Diagnosis not present

## 2024-10-02 DIAGNOSIS — J329 Chronic sinusitis, unspecified: Secondary | ICD-10-CM | POA: Diagnosis not present

## 2024-10-02 NOTE — Assessment & Plan Note (Signed)
 Advised to use saline nasal spray frequently during the day Nettie pot recommended
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
## 2024-10-02 NOTE — Assessment & Plan Note (Addendum)
 Lab Results  Component Value Date   TSH 3.02 03/14/2024  Clinically euthyroid Continue Synthroid  75 mcg daily

## 2024-10-02 NOTE — Assessment & Plan Note (Signed)
 Stable and asymptomatic

## 2024-10-02 NOTE — Patient Instructions (Signed)

## 2024-10-29 ENCOUNTER — Telehealth: Admitting: Physician Assistant

## 2024-10-29 DIAGNOSIS — J329 Chronic sinusitis, unspecified: Secondary | ICD-10-CM | POA: Diagnosis not present

## 2024-10-29 MED ORDER — DOXYCYCLINE HYCLATE 100 MG PO TABS: 100.0000 mg | ORAL_TABLET | Freq: Two times a day (BID) | ORAL | 0 refills | Status: AC

## 2024-10-29 NOTE — Progress Notes (Signed)

## 2025-03-13 ENCOUNTER — Encounter

## 2025-03-13 ENCOUNTER — Other Ambulatory Visit

## 2025-04-03 ENCOUNTER — Ambulatory Visit: Admitting: Emergency Medicine
